# Patient Record
Sex: Male | Born: 1955 | Race: Asian | Hispanic: No | Marital: Married | Smoking: Never smoker
Health system: Southern US, Community
[De-identification: ages and names within clinical notes are randomized; demographics above are authoritative.]

## PROBLEM LIST (undated history)

## (undated) DIAGNOSIS — G609 Hereditary and idiopathic neuropathy, unspecified: Secondary | ICD-10-CM

## (undated) DIAGNOSIS — N2581 Secondary hyperparathyroidism of renal origin: Secondary | ICD-10-CM

## (undated) DIAGNOSIS — N189 Chronic kidney disease, unspecified: Secondary | ICD-10-CM

## (undated) DIAGNOSIS — E875 Hyperkalemia: Secondary | ICD-10-CM

## (undated) DIAGNOSIS — R002 Palpitations: Secondary | ICD-10-CM

## (undated) DIAGNOSIS — R809 Proteinuria, unspecified: Secondary | ICD-10-CM

## (undated) DIAGNOSIS — E785 Hyperlipidemia, unspecified: Secondary | ICD-10-CM

## (undated) DIAGNOSIS — I2510 Atherosclerotic heart disease of native coronary artery without angina pectoris: Secondary | ICD-10-CM

## (undated) DIAGNOSIS — J189 Pneumonia, unspecified organism: Secondary | ICD-10-CM

## (undated) DIAGNOSIS — E119 Type 2 diabetes mellitus without complications: Secondary | ICD-10-CM

## (undated) DIAGNOSIS — D638 Anemia in other chronic diseases classified elsewhere: Secondary | ICD-10-CM

## (undated) DIAGNOSIS — Z940 Kidney transplant status: Secondary | ICD-10-CM

## (undated) DIAGNOSIS — I10 Essential (primary) hypertension: Secondary | ICD-10-CM

## (undated) DIAGNOSIS — I214 Non-ST elevation (NSTEMI) myocardial infarction: Secondary | ICD-10-CM

## (undated) HISTORY — DX: Chronic kidney disease, unspecified: N18.9

## (undated) HISTORY — DX: Proteinuria, unspecified: R80.9

## (undated) HISTORY — DX: Essential (primary) hypertension: I10

## (undated) HISTORY — PX: CATARACT EXTRACTION W/ INTRAOCULAR LENS IMPLANT: SHX1309

## (undated) HISTORY — DX: Secondary hyperparathyroidism of renal origin: N25.81

## (undated) HISTORY — DX: Hyperkalemia: E87.5

## (undated) HISTORY — DX: Hereditary and idiopathic neuropathy, unspecified: G60.9

## (undated) HISTORY — DX: Hyperlipidemia, unspecified: E78.5

## (undated) HISTORY — DX: Palpitations: R00.2

---

## 2000-02-18 ENCOUNTER — Encounter: Admission: RE | Admit: 2000-02-18 | Discharge: 2000-05-18 | Payer: Self-pay | Admitting: Endocrinology

## 2004-02-12 ENCOUNTER — Ambulatory Visit: Payer: Self-pay | Admitting: Endocrinology

## 2004-02-21 ENCOUNTER — Ambulatory Visit: Payer: Self-pay | Admitting: Endocrinology

## 2004-05-06 ENCOUNTER — Ambulatory Visit: Payer: Self-pay | Admitting: Endocrinology

## 2004-05-12 ENCOUNTER — Ambulatory Visit: Payer: Self-pay | Admitting: Endocrinology

## 2004-06-09 ENCOUNTER — Encounter: Admission: RE | Admit: 2004-06-09 | Discharge: 2004-06-09 | Payer: Self-pay | Admitting: Neurology

## 2004-07-17 ENCOUNTER — Ambulatory Visit: Payer: Self-pay | Admitting: Endocrinology

## 2004-12-09 ENCOUNTER — Ambulatory Visit: Payer: Self-pay | Admitting: Endocrinology

## 2004-12-15 ENCOUNTER — Ambulatory Visit: Payer: Self-pay | Admitting: Endocrinology

## 2005-08-19 ENCOUNTER — Ambulatory Visit: Payer: Self-pay | Admitting: Endocrinology

## 2005-08-31 ENCOUNTER — Ambulatory Visit: Payer: Self-pay | Admitting: Endocrinology

## 2005-09-17 ENCOUNTER — Ambulatory Visit: Payer: Self-pay | Admitting: Endocrinology

## 2005-09-23 ENCOUNTER — Ambulatory Visit: Payer: Self-pay | Admitting: Endocrinology

## 2005-12-21 ENCOUNTER — Ambulatory Visit: Payer: Self-pay | Admitting: Endocrinology

## 2005-12-21 LAB — CONVERTED CEMR LAB: Hgb A1c MFr Bld: 14.7 % — ABNORMAL HIGH (ref 4.6–6.0)

## 2005-12-23 ENCOUNTER — Ambulatory Visit: Payer: Self-pay | Admitting: Endocrinology

## 2006-04-12 ENCOUNTER — Ambulatory Visit: Payer: Self-pay | Admitting: Endocrinology

## 2006-04-12 LAB — CONVERTED CEMR LAB: Hgb A1c MFr Bld: 13.5 % — ABNORMAL HIGH (ref 4.6–6.0)

## 2006-04-14 ENCOUNTER — Ambulatory Visit: Payer: Self-pay | Admitting: Endocrinology

## 2006-07-12 ENCOUNTER — Ambulatory Visit: Payer: Self-pay | Admitting: Endocrinology

## 2006-07-12 LAB — CONVERTED CEMR LAB
Creatinine,U: 211.7 mg/dL
Hgb A1c MFr Bld: 13.6 % — ABNORMAL HIGH (ref 4.6–6.0)
Microalb Creat Ratio: 109.1 mg/g — ABNORMAL HIGH (ref 0.0–30.0)
Microalb, Ur: 23.1 mg/dL — ABNORMAL HIGH (ref 0.0–1.9)

## 2006-07-14 ENCOUNTER — Ambulatory Visit: Payer: Self-pay | Admitting: Endocrinology

## 2007-01-10 ENCOUNTER — Encounter: Payer: Self-pay | Admitting: Endocrinology

## 2007-01-10 DIAGNOSIS — G609 Hereditary and idiopathic neuropathy, unspecified: Secondary | ICD-10-CM

## 2007-01-10 DIAGNOSIS — E785 Hyperlipidemia, unspecified: Secondary | ICD-10-CM

## 2007-01-10 DIAGNOSIS — F329 Major depressive disorder, single episode, unspecified: Secondary | ICD-10-CM

## 2007-01-10 DIAGNOSIS — I10 Essential (primary) hypertension: Secondary | ICD-10-CM

## 2007-01-10 DIAGNOSIS — J309 Allergic rhinitis, unspecified: Secondary | ICD-10-CM | POA: Insufficient documentation

## 2007-01-10 HISTORY — DX: Hereditary and idiopathic neuropathy, unspecified: G60.9

## 2007-01-10 HISTORY — DX: Hyperlipidemia, unspecified: E78.5

## 2007-01-10 HISTORY — DX: Essential (primary) hypertension: I10

## 2007-02-10 ENCOUNTER — Ambulatory Visit: Payer: Self-pay | Admitting: Endocrinology

## 2007-02-11 LAB — CONVERTED CEMR LAB
ALT: 33 units/L (ref 0–53)
AST: 32 units/L (ref 0–37)
Albumin: 3.3 g/dL — ABNORMAL LOW (ref 3.5–5.2)
Alkaline Phosphatase: 70 units/L (ref 39–117)
BUN: 26 mg/dL — ABNORMAL HIGH (ref 6–23)
Bacteria, UA: NEGATIVE
Basophils Absolute: 0 10*3/uL (ref 0.0–0.1)
Basophils Relative: 0 % (ref 0.0–1.0)
Bilirubin Urine: NEGATIVE
Bilirubin, Direct: 0.1 mg/dL (ref 0.0–0.3)
CO2: 28 meq/L (ref 19–32)
Calcium: 8.9 mg/dL (ref 8.4–10.5)
Chloride: 104 meq/L (ref 96–112)
Cholesterol: 142 mg/dL (ref 0–200)
Creatinine, Ser: 1.8 mg/dL — ABNORMAL HIGH (ref 0.4–1.5)
Creatinine,U: 204.1 mg/dL
Crystals: NEGATIVE
Eosinophils Absolute: 0.4 10*3/uL (ref 0.0–0.6)
Eosinophils Relative: 5.5 % — ABNORMAL HIGH (ref 0.0–5.0)
GFR calc Af Amer: 51 mL/min
GFR calc non Af Amer: 42 mL/min
Glucose, Bld: 194 mg/dL — ABNORMAL HIGH (ref 70–99)
HCT: 35.7 % — ABNORMAL LOW (ref 39.0–52.0)
HDL: 33.2 mg/dL — ABNORMAL LOW (ref >39.0)
Hemoglobin: 12.4 g/dL — ABNORMAL LOW (ref 13.0–17.0)
Hgb A1c MFr Bld: 12.6 % — ABNORMAL HIGH (ref 4.6–6.0)
Ketones, ur: NEGATIVE mg/dL
LDL Cholesterol: 88 mg/dL (ref 0–99)
Leukocytes, UA: NEGATIVE
Lymphocytes Relative: 33.2 % (ref 12.0–46.0)
MCHC: 34.6 g/dL (ref 30.0–36.0)
MCV: 86.2 fL (ref 78.0–100.0)
Microalb Creat Ratio: 762.9 mg/g — ABNORMAL HIGH (ref 0.0–30.0)
Microalb, Ur: 155.7 mg/dL — ABNORMAL HIGH (ref 0.0–1.9)
Monocytes Absolute: 0.7 10*3/uL (ref 0.2–0.7)
Monocytes Relative: 8.6 % (ref 3.0–11.0)
Neutro Abs: 4.1 10*3/uL (ref 1.4–7.7)
Neutrophils Relative %: 52.7 % (ref 43.0–77.0)
Nitrite: NEGATIVE
PSA: 1.31 ng/mL (ref 0.10–4.00)
Platelets: 182 10*3/uL (ref 150–400)
Potassium: 4.5 meq/L (ref 3.5–5.1)
RBC: 4.14 M/uL — ABNORMAL LOW (ref 4.22–5.81)
RDW: 11.9 % (ref 11.5–14.6)
Sodium: 138 meq/L (ref 135–145)
Specific Gravity, Urine: >=1.03 (ref 1.000–1.03)
Squamous Epithelial / HPF: NEGATIVE /lpf
TSH: 3.32 microintl units/mL (ref 0.35–5.50)
Total Bilirubin: 0.6 mg/dL (ref 0.3–1.2)
Total CHOL/HDL Ratio: 4.3
Total Protein, Urine: >=300 mg/dL — AB
Total Protein: 6.9 g/dL (ref 6.0–8.3)
Triglycerides: 106 mg/dL (ref 0–149)
Urine Glucose: 100 mg/dL — AB
Urobilinogen, UA: 0.2 (ref 0.0–1.0)
VLDL: 21 mg/dL (ref 0–40)
WBC, UA: NONE SEEN cells/hpf
WBC: 7.8 10*3/uL (ref 4.5–10.5)
pH: 5.5 (ref 5.0–8.0)

## 2007-02-15 ENCOUNTER — Ambulatory Visit: Payer: Self-pay | Admitting: Endocrinology

## 2007-02-15 DIAGNOSIS — D649 Anemia, unspecified: Secondary | ICD-10-CM

## 2007-05-23 ENCOUNTER — Ambulatory Visit: Payer: Self-pay | Admitting: Endocrinology

## 2007-05-23 DIAGNOSIS — F411 Generalized anxiety disorder: Secondary | ICD-10-CM | POA: Insufficient documentation

## 2007-05-23 LAB — CONVERTED CEMR LAB: Hgb A1c MFr Bld: 14 % — ABNORMAL HIGH (ref 4.6–6.0)

## 2007-11-17 ENCOUNTER — Ambulatory Visit: Payer: Self-pay | Admitting: Endocrinology

## 2007-11-17 DIAGNOSIS — M79609 Pain in unspecified limb: Secondary | ICD-10-CM | POA: Insufficient documentation

## 2007-11-17 LAB — CONVERTED CEMR LAB
Hgb A1c MFr Bld: 12.1 % — ABNORMAL HIGH (ref 4.6–6.0)
Sed Rate: 32 mm/hr — ABNORMAL HIGH (ref 0–16)

## 2008-03-19 ENCOUNTER — Ambulatory Visit: Payer: Self-pay | Admitting: Endocrinology

## 2008-03-19 DIAGNOSIS — R002 Palpitations: Secondary | ICD-10-CM

## 2008-03-19 HISTORY — DX: Palpitations: R00.2

## 2008-06-06 ENCOUNTER — Telehealth (INDEPENDENT_AMBULATORY_CARE_PROVIDER_SITE_OTHER): Payer: Self-pay | Admitting: *Deleted

## 2008-06-07 ENCOUNTER — Ambulatory Visit: Payer: Self-pay | Admitting: Endocrinology

## 2008-06-07 DIAGNOSIS — R809 Proteinuria, unspecified: Secondary | ICD-10-CM | POA: Insufficient documentation

## 2008-06-07 DIAGNOSIS — N189 Chronic kidney disease, unspecified: Secondary | ICD-10-CM

## 2008-06-07 HISTORY — DX: Proteinuria, unspecified: R80.9

## 2008-06-07 HISTORY — DX: Chronic kidney disease, unspecified: N18.9

## 2008-06-07 LAB — CONVERTED CEMR LAB
BUN: 54 mg/dL — ABNORMAL HIGH (ref 6–23)
Bilirubin Urine: NEGATIVE
CO2: 27 meq/L (ref 19–32)
Calcium: 8.8 mg/dL (ref 8.4–10.5)
Chloride: 110 meq/L (ref 96–112)
Creatinine, Ser: 2.5 mg/dL — ABNORMAL HIGH (ref 0.4–1.5)
GFR calc non Af Amer: 28.71 mL/min (ref >60)
Glucose, Bld: 185 mg/dL — ABNORMAL HIGH (ref 70–99)
Glucose, Urine, Semiquant: 100
Hgb A1c MFr Bld: 10.4 % — ABNORMAL HIGH (ref 4.6–6.5)
Ketones, urine, test strip: NEGATIVE
Nitrite: NEGATIVE
Potassium: 5.2 meq/L — ABNORMAL HIGH (ref 3.5–5.1)
Protein, U semiquant: >=300
Sodium: 140 meq/L (ref 135–145)
Specific Gravity, Urine: 1.025
Urobilinogen, UA: 0.2
WBC Urine, dipstick: NEGATIVE
pH: 5

## 2008-06-11 ENCOUNTER — Encounter: Payer: Self-pay | Admitting: Endocrinology

## 2008-06-13 LAB — CONVERTED CEMR LAB: Protein, Ur: 3640 mg/24hr — ABNORMAL HIGH (ref 50–100)

## 2008-07-04 ENCOUNTER — Encounter: Payer: Self-pay | Admitting: Endocrinology

## 2008-07-10 ENCOUNTER — Ambulatory Visit: Payer: Self-pay | Admitting: Endocrinology

## 2008-07-10 DIAGNOSIS — N2581 Secondary hyperparathyroidism of renal origin: Secondary | ICD-10-CM

## 2008-07-10 HISTORY — DX: Secondary hyperparathyroidism of renal origin: N25.81

## 2008-07-10 LAB — CONVERTED CEMR LAB
Albumin ELP: 52.9 % — ABNORMAL LOW
Alpha-1-Globulin: 4.5 %
Alpha-2-Globulin: 8.3 %
BUN: 73 mg/dL — ABNORMAL HIGH
Basophils Absolute: 0.1 K/uL
Basophils Relative: 0.6 %
Beta Globulin: 5.8 %
CO2: 29 meq/L
Calcium, Total (PTH): 9.1 mg/dL
Calcium: 8.7 mg/dL
Chloride: 101 meq/L
Creatinine, Ser: 3.5 mg/dL — ABNORMAL HIGH
Eosinophils Absolute: 0.2 K/uL
Eosinophils Relative: 2.1 %
Folate: >20 ng/mL
GFR calc non Af Amer: 19.46 mL/min
Gamma Globulin: 20.9 % — ABNORMAL HIGH
Glucose, Bld: 341 mg/dL — ABNORMAL HIGH
HCT: 32.5 % — ABNORMAL LOW
Hemoglobin: 11.1 g/dL — ABNORMAL LOW
Iron: 48 ug/dL
Lymphocytes Relative: 17.7 %
Lymphs Abs: 1.5 K/uL
MCHC: 34.1 g/dL
MCV: 85 fL
Monocytes Absolute: 0.7 K/uL
Monocytes Relative: 7.9 %
Neutro Abs: 6.2 K/uL
Neutrophils Relative %: 71.7 %
PTH: 94.6 pg/mL — ABNORMAL HIGH
Platelets: 196 K/uL
Potassium: 5.5 meq/L — ABNORMAL HIGH
RBC: 3.83 M/uL — ABNORMAL LOW
RDW: 12.5 %
Saturation Ratios: 16.6 % — ABNORMAL LOW
Sodium: 136 meq/L
Total Protein, Serum Electrophoresis: 7.6 g/dL
Transferrin: 206.8 mg/dL — ABNORMAL LOW
Vitamin B-12: 907 pg/mL
WBC: 8.7 10*3/microliter

## 2008-11-01 ENCOUNTER — Encounter: Payer: Self-pay | Admitting: Endocrinology

## 2008-11-12 ENCOUNTER — Encounter (HOSPITAL_COMMUNITY): Admission: RE | Admit: 2008-11-12 | Discharge: 2009-02-10 | Payer: Self-pay | Admitting: Nephrology

## 2009-03-29 ENCOUNTER — Telehealth (INDEPENDENT_AMBULATORY_CARE_PROVIDER_SITE_OTHER): Payer: Self-pay | Admitting: *Deleted

## 2009-04-08 ENCOUNTER — Encounter: Payer: Self-pay | Admitting: Endocrinology

## 2009-04-09 ENCOUNTER — Ambulatory Visit: Payer: Self-pay | Admitting: Endocrinology

## 2009-04-09 DIAGNOSIS — E875 Hyperkalemia: Secondary | ICD-10-CM

## 2009-04-09 HISTORY — DX: Hyperkalemia: E87.5

## 2009-07-09 ENCOUNTER — Encounter: Payer: Self-pay | Admitting: Endocrinology

## 2009-11-05 ENCOUNTER — Encounter: Payer: Self-pay | Admitting: Endocrinology

## 2009-11-25 ENCOUNTER — Encounter (HOSPITAL_COMMUNITY)
Admission: RE | Admit: 2009-11-25 | Discharge: 2010-02-23 | Payer: Self-pay | Source: Home / Self Care | Attending: Nephrology | Admitting: Nephrology

## 2009-12-09 ENCOUNTER — Encounter: Payer: Self-pay | Admitting: Endocrinology

## 2009-12-31 ENCOUNTER — Ambulatory Visit: Payer: Self-pay | Admitting: Vascular Surgery

## 2010-01-08 ENCOUNTER — Ambulatory Visit (HOSPITAL_COMMUNITY)
Admission: RE | Admit: 2010-01-08 | Discharge: 2010-01-08 | Payer: Self-pay | Source: Home / Self Care | Attending: Vascular Surgery | Admitting: Vascular Surgery

## 2010-01-31 ENCOUNTER — Ambulatory Visit: Payer: Self-pay | Admitting: Vascular Surgery

## 2010-02-11 ENCOUNTER — Encounter: Payer: Self-pay | Admitting: Endocrinology

## 2010-02-19 LAB — POCT HEMOGLOBIN-HEMACUE: Hemoglobin: 9.2 g/dL — ABNORMAL LOW (ref 13.0–17.0)

## 2010-02-24 ENCOUNTER — Encounter (HOSPITAL_COMMUNITY)
Admission: RE | Admit: 2010-02-24 | Discharge: 2010-03-04 | Payer: Self-pay | Source: Home / Self Care | Attending: Nephrology | Admitting: Nephrology

## 2010-03-02 LAB — CONVERTED CEMR LAB
Basophils Absolute: 0 10*3/uL (ref 0.0–0.1)
Basophils Relative: 0.3 % (ref 0.0–1.0)
Eosinophils Absolute: 0.3 10*3/uL (ref 0.0–0.6)
Eosinophils Relative: 3.6 % (ref 0.0–5.0)
Ferritin: 53.9 ng/mL (ref 22.0–322.0)
Folate: 16.3 ng/mL
HCT: 38.6 % — ABNORMAL LOW (ref 39.0–52.0)
Hemoglobin: 13.2 g/dL (ref 13.0–17.0)
Hgb A1c MFr Bld: 12.1 % — ABNORMAL HIGH (ref 4.6–6.0)
Iron: 61 ug/dL (ref 42–165)
Lymphocytes Relative: 30.1 % (ref 12.0–46.0)
MCHC: 34.3 g/dL (ref 30.0–36.0)
MCV: 86.5 fL (ref 78.0–100.0)
Monocytes Absolute: 0.6 10*3/uL (ref 0.2–0.7)
Monocytes Relative: 6.8 % (ref 3.0–11.0)
Neutro Abs: 5.1 10*3/uL (ref 1.4–7.7)
Neutrophils Relative %: 59.2 % (ref 43.0–77.0)
Platelets: 196 10*3/uL (ref 150–400)
RBC: 4.46 M/uL (ref 4.22–5.81)
RDW: 12.4 % (ref 11.5–14.6)
Vitamin B-12: 600 pg/mL (ref 211–911)
WBC: 8.6 10*3/uL (ref 4.5–10.5)

## 2010-03-04 LAB — POCT HEMOGLOBIN-HEMACUE: Hemoglobin: 10.8 g/dL — ABNORMAL LOW (ref 13.0–17.0)

## 2010-03-04 NOTE — Assessment & Plan Note (Signed)
Summary: fu--d/t---stc   Vital Signs:  Patient profile:   55 year old male Height:      65 inches Weight:      161 pounds BMI:     26.89 O2 Sat:      99 % on Room air Temp:     98.1 degrees F oral Pulse rate:   82 / minute Pulse rhythm:   regular BP sitting:   148 / 90  (left arm) Cuff size:   regular  Vitals Entered By: Brenton Grills (April 09, 2009 3:52 PM)  O2 Flow:  Room air CC: follow-up visit/aj   CC:  follow-up visit/aj.  History of Present Illness: the status of 3 chronic medical problems is addressed today: he stopped vytorn recently on his own, but he tolerated it well. no cbg record, but states cbg's are intermittently mildly low at night.  he denies syncope. hyperkalemia was noted by dr Kathrene Bongo.  no muscle weakness. dr Kathrene Bongo gave him rx for hydralazine, but pt says he doesn't mind taking a name-brand prescription.  Current Medications (verified): 1)  Adult Aspirin Low Strength 81 Mg  Tbdp (Aspirin) .... Take 1 By Mouth Qd 2)  Vytorin 10-80 Mg  Tabs (Ezetimibe-Simvastatin) .... Take 1 By Mouth Qd 3)  Alprazolam 0.25 Mg  Tabs (Alprazolam) .... Take 1-2 By Mouth Once Daily Prn 4)  Levitra 20 Mg  Tabs (Vardenafil Hcl) .... Use Prn 5)  Bd U/f Original Pen Needle 29g X 12.91mm  Misc (Insulin Pen Needle) .... Use As Directed 6)  Novolog Mix 70/30 Flexpen 70-30 %  Susp (Insulin Aspart Prot & Aspart) .... 50 Units Qam 7)  Tramadol Hcl 50 Mg Tabs (Tramadol Hcl) .Marland Kitchen.. 1-2 Q6h As Needed Pain 8)  Carvedilol 25 Mg Tabs (Carvedilol) .... Bid 9)  Calcitriol 0.25 Mcg Caps (Calcitriol) .Marland Kitchen.. 1 Once Daily 10)  Furosemide 40 Mg Tabs (Furosemide) .Marland Kitchen.. 1 Tab Qd 11)  Lisinopril 10 Mg Tabs (Lisinopril) .Marland Kitchen.. 1 Tab Qd 12)  Ciprofloxacin Hcl 250 Mg Tabs (Ciprofloxacin Hcl) .... Take 1 Two Times A Day If Diarrhea 13)  Ondansetron 4 Mg Tbdp (Ondansetron) .Marland Kitchen.. 1 Q4h As Needed For Nausea  Allergies (verified): 1)  ! 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    Physical Exam  General:  normal appearance.   Extremities:  trace right pedal edema and trace left pedal edema.   Additional Exam:  a1c=10.7 ldl=192 k+=5.8   Impression & Recommendations:  Problem # 1:  HYPERLIPIDEMIA (ICD-272.4) therapy limited by noncompliance.  i'll do the best i can.  Problem # 2:  DIABETES MELLITUS, TYPE I (ICD-250.01) therapy limited by noncompliance with cbg's.  i'll do the best i can.  Problem # 3:  HYPERTENSION (ICD-401.9) needs increased rx.  since he doesn't mind taking a name--brand rx, i'm sure bidil would be ok with dr Kathrene Bongo.  Problem # 4:  HYPERKALEMIA (ICD-276.7) due to renal dz and lisinopril  Medications Added to Medication List This Visit: 1)  Diltiazem Hcl Er Beads 120 Mg Xr24h-cap (Diltiazem hcl er beads) .Marland Kitchen.. 1 once daily 2)  Crestor 40  Mg Tabs (Rosuvastatin calcium) .Marland Kitchen.. 1 at bedtime 3)  Bidil 20-37.5 Mg Tabs (Isosorb dinitrate-hydralazine) .Marland Kitchen.. 1 two times a day 4)  Levemir Flexpen 100 Unit/ml Soln (Insulin detemir) .... 35 units each am, and needles once daily, # 30  Other Orders: EKG w/ Interpretation (93000) Est. Patient Level IV (46962)  Patient Instructions: 1)  stop lisinopril. 2)  take crestor 40 mg once daily 3)  bidil 1 two times a day. 4)  change current insulin to levemir 35 units each am. 5)  check your blood sugar 2 times a day.   vary the time of day when you check, between before the 3 meals, and at bedtime.  also check if you have symptoms of your blood sugar being too high or too low.  please keep a record of the readings and bring it to your next appointment here.  please call us sooner if you are having low blood sugar episodes 6)  Please schedule an appointment  for a physical in 3 months. Prescriptions: LEVEMIR FLEXPEN 100 UNIT/ML SOLN (INSULIN DETEMIR) 35 units each am, and needles once daily, # 30  #1 box x 11   Entered and Authorized by:   Minus Breeding MD   Signed by:   Minus Breeding MD on 04/09/2009   Method used:   Electronically to        [facility] (retail)       [address]       [place], [place]  [id number]       Ph: [phone]       Fax: [phone]   RxID:   [id number] BIDIL 20-37.5 MG TABS (ISOSORB DINITRATE-HYDRALAZINE) 1 two times a day  #60 x 11   Entered and Authorized by:   Minus Breeding MD   Signed by:   Minus Breeding MD on 04/09/2009   Method used:   Electronically to        [facility] (retail)       [address]       [place], [place]  [address]       Ph: [phone]       Fax: [phone]   RxID:   [id number] BIDIL 20-37.5 MG TABS (ISOSORB DINITRATE-HYDRALAZINE) 1 two times a day  #60 x 11   Entered and Authorized by:   Minus Breeding MD   Signed by:   Minus Breeding MD on 04/09/2009   Method used:   Electronically to        [facility] (retail)       [address]       [place], [place]  [address]       Ph: [phone]       Fax: [phone]   RxID:   [id number] CRESTOR 40 MG TABS (ROSUVASTATIN CALCIUM) 1 at bedtime  #30 x 11   Entered and Authorized by:   Minus Breeding MD   Signed by:   Minus Breeding MD on 04/09/2009   Method used:   Electronically to        [facility] (retail)       [address]       [place], [place]  [address]        Ph: [phone]       Fax: [phone]   RxID:   [id number] DILTIAZEM HCL ER BEADS 120 MG XR24H-CAP (DILTIAZEM HCL ER  BEADS) 1 once daily  #30 x 11   Entered and Authorized by:   Minus Breeding MD   Signed by:   Minus Breeding MD on 04/09/2009   Method used:   Electronically to        [facility] (retail)       [address]       [place], [place]  [address]       Ph: [phone]       Fax: [phone]   RxID:   [id number]

## 2010-03-04 NOTE — Letter (Signed)
Summary: [facility]  [facility]   Imported By: Lester Chebanse 12/17/2009 07:55:31  _____________________________________________________________________  External Attachment:    Type:   Image     Comment:   External Document

## 2010-03-04 NOTE — Progress Notes (Signed)
Summary: Follow up appt  Phone Note Outgoing Call   Summary of Call: Left message at pts work to call me back. MD says a f/u is due. Initial call taken by: Josph Macho RMA,  March 29, 2009 1:51 PM  Follow-up for Phone Call        Pt has appt for 3/8 @ 3:45 p.m. Follow-up by: Verdell Face,  March 29, 2009 2:19 PM

## 2010-03-04 NOTE — Letter (Signed)
Summary: [facility]   Imported By: Lester Acton 11/26/2009 08:08:38  _____________________________________________________________________  External Attachment:    Type:   Image     Comment:   External Document

## 2010-03-04 NOTE — Letter (Signed)
Summary: [facility]   Imported By: Sherian Rein 04/17/2009 08:26:34  _____________________________________________________________________  External Attachment:    Type:   Image     Comment:   External Document

## 2010-03-04 NOTE — Letter (Signed)
Summary: [facility]   Imported By: Sherian Rein 07/23/2009 11:17:47  _____________________________________________________________________  External Attachment:    Type:   Image     Comment:   External Document

## 2010-03-06 NOTE — Letter (Signed)
Summary: [facility]  [facility]   Imported By: Lennie Odor 02/21/2010 15:05:57  _____________________________________________________________________  External Attachment:    Type:   Image     Comment:   External Document

## 2010-03-11 ENCOUNTER — Ambulatory Visit (INDEPENDENT_AMBULATORY_CARE_PROVIDER_SITE_OTHER): Payer: 59 | Admitting: Vascular Surgery

## 2010-03-11 DIAGNOSIS — N186 End stage renal disease: Secondary | ICD-10-CM

## 2010-03-14 NOTE — Assessment & Plan Note (Signed)
OFFICE VISIT  Steven Riddle, [name] DOB:  21-Jan-1956                                       03/11/2010 CHART#:[id number]  This patient returns today for initial follow-up regarding his right upper arm AV fistula which I created December 7 for end-stage renal disease.  He is not yet on hemodialysis.  He is left-handed.  He denies any pain or numbness in the right hand side of the fistula.  On exam today his right upper arm fistula has an excellent pulse and palpable thrill.  There is a very a very prominent side branch communicating with the basilic system about 5 cm from the antecubital incision.  Compression of this does augment the fistula slightly.  He has a palpable radial pulse distally.  I think this branch should be ligated to help maturation the fistula and I have schedule that as an outpatient on Thursday, 03/20/2010. Hopefully this will increase the likelihood that this fistula will continue to mature and be satisfactory for hemodialysis.    Quita Skye Hart Rochester, M.D. Electronically Signed  JDL/MEDQ  D:  03/11/2010  T:  03/12/2010  Job:  [id number]

## 2010-03-17 ENCOUNTER — Encounter (HOSPITAL_COMMUNITY): Payer: Self-pay

## 2010-03-20 ENCOUNTER — Ambulatory Visit (HOSPITAL_COMMUNITY)
Admission: RE | Admit: 2010-03-20 | Discharge: 2010-03-20 | Disposition: A | Discharge status: Home / Self Care | Payer: 59 | Source: Ambulatory Visit | Attending: Vascular Surgery | Admitting: Vascular Surgery

## 2010-03-20 DIAGNOSIS — N186 End stage renal disease: Secondary | ICD-10-CM

## 2010-03-20 DIAGNOSIS — Y832 Surgical operation with anastomosis, bypass or graft as the cause of abnormal reaction of the patient, or of later complication, without mention of misadventure at the time of the procedure: Secondary | ICD-10-CM | POA: Insufficient documentation

## 2010-03-20 DIAGNOSIS — E119 Type 2 diabetes mellitus without complications: Secondary | ICD-10-CM | POA: Insufficient documentation

## 2010-03-20 DIAGNOSIS — I120 Hypertensive chronic kidney disease with stage 5 chronic kidney disease or end stage renal disease: Secondary | ICD-10-CM | POA: Insufficient documentation

## 2010-03-20 DIAGNOSIS — T82898A Other specified complication of vascular prosthetic devices, implants and grafts, initial encounter: Secondary | ICD-10-CM

## 2010-03-20 DIAGNOSIS — Z01812 Encounter for preprocedural laboratory examination: Secondary | ICD-10-CM | POA: Insufficient documentation

## 2010-03-20 DIAGNOSIS — T82598A Other mechanical complication of other cardiac and vascular devices and implants, initial encounter: Secondary | ICD-10-CM | POA: Insufficient documentation

## 2010-03-20 LAB — SURGICAL PCR SCREEN
MRSA, PCR: NEGATIVE
Staphylococcus aureus: NEGATIVE

## 2010-03-20 LAB — POCT I-STAT 4, (NA,K, GLUC, HGB,HCT)
Glucose, Bld: 101 mg/dL — ABNORMAL HIGH (ref 70–99)
HCT: 37 % — ABNORMAL LOW (ref 39.0–52.0)
Hemoglobin: 12.6 g/dL — ABNORMAL LOW (ref 13.0–17.0)
Potassium: 4.4 mEq/L (ref 3.5–5.1)
Sodium: 141 mEq/L (ref 135–145)

## 2010-03-20 LAB — GLUCOSE, CAPILLARY: Glucose-Capillary: 79 mg/dL (ref 70–99)

## 2010-03-24 ENCOUNTER — Encounter (HOSPITAL_COMMUNITY): Payer: 59 | Attending: Nephrology

## 2010-03-24 ENCOUNTER — Other Ambulatory Visit: Payer: Self-pay | Admitting: Nephrology

## 2010-03-24 DIAGNOSIS — D638 Anemia in other chronic diseases classified elsewhere: Secondary | ICD-10-CM | POA: Insufficient documentation

## 2010-03-24 DIAGNOSIS — N184 Chronic kidney disease, stage 4 (severe): Secondary | ICD-10-CM | POA: Insufficient documentation

## 2010-03-24 LAB — IRON AND TIBC
Iron: 104 ug/dL (ref 42–135)
Saturation Ratios: 50 % (ref 20–55)
TIBC: 209 ug/dL — ABNORMAL LOW (ref 215–435)
UIBC: 105 ug/dL

## 2010-03-24 LAB — FERRITIN: Ferritin: 705 ng/mL — ABNORMAL HIGH (ref 22–322)

## 2010-03-24 LAB — RENAL FUNCTION PANEL
Albumin: 3 g/dL — ABNORMAL LOW (ref 3.5–5.2)
BUN: 82 mg/dL — ABNORMAL HIGH (ref 6–23)
CO2: 26 mEq/L (ref 19–32)
Calcium: 8.6 mg/dL (ref 8.4–10.5)
Chloride: 104 mEq/L (ref 96–112)
Creatinine, Ser: 4.41 mg/dL — ABNORMAL HIGH (ref 0.4–1.5)
GFR calc Af Amer: 17 mL/min — ABNORMAL LOW (ref >60)
GFR calc non Af Amer: 14 mL/min — ABNORMAL LOW (ref >60)
Glucose, Bld: 279 mg/dL — ABNORMAL HIGH (ref 70–99)
Phosphorus: 5.3 mg/dL — ABNORMAL HIGH (ref 2.3–4.6)
Potassium: 5.1 mEq/L (ref 3.5–5.1)
Sodium: 138 mEq/L (ref 135–145)

## 2010-03-24 NOTE — Op Note (Signed)
°  [name], [name]              ACCOUNT NO.:  [id number]  MEDICAL RECORD NO.:  [id number]           PATIENT TYPE:  O  LOCATION:  [facility]                         FACILITY:  [facility]  PHYSICIAN:  Quita Skye. Hart Rochester, M.D.  DATE OF BIRTH:  1955-09-17  DATE OF PROCEDURE:  03/20/2010 DATE OF DISCHARGE:  03/20/2010                              OPERATIVE REPORT   PREOPERATIVE DIAGNOSIS:  End-stage renal disease with poorly maturing atrioventricular fistula right upper extremity secondary to competing branch.  POSTOPERATIVE DIAGNOSIS:  End-stage renal disease with poorly maturing atrioventricular fistula right upper extremity secondary to competing branch.  OPERATIONS:  Ligation of competing branch of right upper arm AV fistula.  SURGEON:  Quita Skye. Hart Rochester, MD  FIRST ASSISTANT:  Nurse.  ANESTHESIA:  Local.  PROCEDURE:  The patient was taken to the operating room, placed in the supine position, at which time right upper extremity was prepped with Betadine scrub and solution and draped in routine sterile manner.  There was a very prominent competing branch in the right upper arm fistula which had been created by me 2 months earlier.  This branch arose about 6 cm from the antecubital area and extended straight medially and was visible to the eye.  I did look at this with the SonoSite ultrasound duplex scanner to check for other branches which were not found.  After infiltration with 1% Xylocaine with epinephrine, a short transverse incision was made over the branch.  Branch itself was easily identified and ligated with 2-0 silk tie.  There continued to be excellent flow in the fistula.  Wound was closed in layers with Vicryl in a subcuticular fashion with Dermabond.  The patient returned to short-stay in stable condition.     Quita Skye Hart Rochester, M.D.     JDL/MEDQ  D:  03/20/2010  T:  03/20/2010  Job:  [id number]  Electronically Signed by Josephina Gip M.D. on 03/24/2010 12:25:31 PM

## 2010-03-25 LAB — POCT HEMOGLOBIN-HEMACUE: Hemoglobin: 10.5 g/dL — ABNORMAL LOW (ref 13.0–17.0)

## 2010-03-25 LAB — PTH, INTACT AND CALCIUM
Calcium, Total (PTH): 8.6 mg/dL (ref 8.4–10.5)
PTH: 267.1 pg/mL — ABNORMAL HIGH (ref 14.0–72.0)

## 2010-04-15 LAB — SURGICAL PCR SCREEN
MRSA, PCR: NEGATIVE
Staphylococcus aureus: NEGATIVE

## 2010-04-15 LAB — POCT I-STAT 4, (NA,K, GLUC, HGB,HCT)
Glucose, Bld: 68 mg/dL — ABNORMAL LOW (ref 70–99)
HCT: 29 % — ABNORMAL LOW (ref 39.0–52.0)
Hemoglobin: 9.9 g/dL — ABNORMAL LOW (ref 13.0–17.0)
Potassium: 4.1 mEq/L (ref 3.5–5.1)
Sodium: 143 mEq/L (ref 135–145)

## 2010-04-15 LAB — GLUCOSE, CAPILLARY
Glucose-Capillary: 115 mg/dL — ABNORMAL HIGH (ref 70–99)
Glucose-Capillary: 124 mg/dL — ABNORMAL HIGH (ref 70–99)
Glucose-Capillary: 68 mg/dL — ABNORMAL LOW (ref 70–99)

## 2010-05-17 ENCOUNTER — Other Ambulatory Visit: Payer: Self-pay | Admitting: Endocrinology

## 2010-06-05 ENCOUNTER — Other Ambulatory Visit (INDEPENDENT_AMBULATORY_CARE_PROVIDER_SITE_OTHER): Payer: 59

## 2010-06-05 ENCOUNTER — Ambulatory Visit (INDEPENDENT_AMBULATORY_CARE_PROVIDER_SITE_OTHER): Payer: 59 | Admitting: Endocrinology

## 2010-06-05 ENCOUNTER — Encounter: Payer: Self-pay | Admitting: Endocrinology

## 2010-06-05 DIAGNOSIS — K7689 Other specified diseases of liver: Secondary | ICD-10-CM

## 2010-06-05 DIAGNOSIS — N400 Enlarged prostate without lower urinary tract symptoms: Secondary | ICD-10-CM

## 2010-06-05 DIAGNOSIS — K828 Other specified diseases of gallbladder: Secondary | ICD-10-CM

## 2010-06-05 DIAGNOSIS — I428 Other cardiomyopathies: Secondary | ICD-10-CM

## 2010-06-05 DIAGNOSIS — K760 Fatty (change of) liver, not elsewhere classified: Secondary | ICD-10-CM

## 2010-06-05 DIAGNOSIS — K8020 Calculus of gallbladder without cholecystitis without obstruction: Secondary | ICD-10-CM

## 2010-06-05 DIAGNOSIS — I429 Cardiomyopathy, unspecified: Secondary | ICD-10-CM | POA: Insufficient documentation

## 2010-06-05 LAB — HEPATIC FUNCTION PANEL
Albumin: 3.3 g/dL — ABNORMAL LOW (ref 3.5–5.2)
Total Protein: 6.5 g/dL (ref 6.0–8.3)

## 2010-06-05 NOTE — Patient Instructions (Addendum)
On this type of insulin schedule, meals should not be missed or delayed. Refer to dr Sharyn Lull, and to a specialist about the gallbladder.  you will be called with a day and time for an appointment, for each of these two. blood tests are being ordered for you today.  please call [phone] to hear your test results.  You will be prompted to enter the 9-digit "MRN" number that appears at the top left of this page, followed by #.  Then you will hear the message. pending the test results, please continue the same medications for now Please make a follow-up appointment in 3 months. (update: i left message on phone-tree:  rx as we discussed).

## 2010-06-05 NOTE — Progress Notes (Signed)
  Subjective:    Patient ID: Steven Riddle, male    DOB: Nov 04, 1955, 55 y.o.   MRN: [id number]  HPI The state of at least three ongoing medical problems is addressed today: no cbg record, but states cbg's are well-controlled.  He has lost a few lbs, due to his efforts. He is pursuing getting a kidney transplant in [place], from a paid donor.  He had an echo in [place], showing decreased lv function. In his eval in [place] for possible renal transplantation, he was also noted to have abnormal abd [place]. He denies ruq pain. US showed enlarged prostate, fatty liver, and abnormal gallbladder. Denies decreased urinary stream    Review of Systems He seldom has hypoglycemia if a meal is missed or delayed.  He has intermittent nocturnal burping.    Objective:   Physical Exam LUNGS:  Clear to auscultation HEART:Regular rate and rhythm with a soft systolic murmur noted. Normal S1,S2.   Pulses: dorsalis pedis intact bilat.   Feet: no deformity.  no ulcer on the feet.  feet are of normal color and temp.  2+ bilat leg edema.  bilat onychomycosis. Neuro: sensation is intact to touch on the feet.    Lab Results  Component Value Date   HGBA1C 10.4* 06/07/2008   Lab Results  Component Value Date   PSA 2.45 06/05/2010   PSA 1.31 02/10/2007   Lab Results  Component Value Date   ALT 26 06/05/2010   AST 22 06/05/2010   ALKPHOS 81 06/05/2010   BILITOT 0.4 06/05/2010   Assessment & Plan:  Abnormal echo, uncertain etiology Abnormal ultrasound of the abdomen, uncertain etiology. Bph, new problem Dm, therapy limited by noncompliance.  i'll do the best i can. renal failure.  He wishes to pursue transplantation in [place]

## 2010-06-09 ENCOUNTER — Other Ambulatory Visit: Payer: Self-pay | Admitting: Endocrinology

## 2010-06-17 NOTE — Procedures (Signed)
CEPHALIC VEIN MAPPING   INDICATION:  AV fistula placement.   HISTORY:  Stage IV/V kidney disease.   EXAM:  The right cephalic vein is compressible with diameter measurements  ranging from 0.62 to 0.17 cm.   The right basilic vein is compressible with diameter measurements  ranging from 0.35 to 0.30 cm.   The left cephalic vein is compressible with diameter measurements  ranging from 0.50 to 0.14 cm.   The left basilic vein is compressible with diameter measurements ranging  from 0.35 to 0.33 cm.   See attached worksheet for all measurements.   IMPRESSION:  Patent right and left cephalic and basilic veins with  diameter measurements as described above.   ___________________________________________  Steven Skye. Hart Riddle, M.D.   NT/MEDQ  D:  12/31/2009  T:  12/31/2009  Job:  [id number]

## 2010-06-17 NOTE — Assessment & Plan Note (Signed)
OFFICE VISIT   RALF, KONOPKA  DOB:  10/14/1955                                       01/31/2010  CHART#:[id number]   CHIEF COMPLAINT:  Follow-up right brachial cephalic fistula.   HISTORY OF PRESENT ILLNESS:  Patient is a 55 year old gentleman who had  a right upper arm brachiocephalic arteriovenous fistula created by Dr.  Hart Rochester on 01/08/2010.  He has been doing well.  He has no signs of steal  in the right hand and no issues with drainage or pain at the wounds.   PHYSICAL EXAMINATION:  This is a well-developed, well-nourished  gentleman in no acute distress.  His heart rate is 70.  His sats are  98%.  His respiratory rate is 10.  On the right upper extremity, he has  a positive thrill and bruit in the cephalic vein.  He also has a  prominent medial branch approximately 3-4 cm above the antecubital fossa  which connects with the basilic system.  A thrill was felt throughout  the cephalic vein to the mid upper arm.  He has a positive radial and  ulnar pulse which are palpable.  His hand is warm and pink with good  sensation and motion.   ASSESSMENT:  A functioning brachiocephalic arteriovenous fistula in the  right upper extremity with prominent side branches, 1 of which is  connected to the basilic system through which there is moderate flow.   PLAN:  Have the patient come back in 4 weeks to see Dr. Hart Rochester to see if  ligation of the side branches would be necessary.   Della Goo, PA-C   Fransisco Hertz, MD  Electronically Signed   RR/MEDQ  D:  01/31/2010  T:  01/31/2010  Job:  [id number]

## 2010-06-17 NOTE — Consult Note (Signed)
NEW PATIENT CONSULTATION   Steven Riddle, Steven Riddle  DOB:  [birth year]                                       12/31/2009  CHART#:[id number]   The patient is a 55 year old male patient with chronic renal  insufficiency.  He is from [place].  He has been followed by Dr.  Kathrene Bongo for chronic renal insufficiency for at least the last 12  months and now presents for vascular access evaluation.  He is left-  handed.  He has never had dialysis in the past.   CHRONIC MEDICAL PROBLEMS:  1. Diabetes mellitus.  2. Hypertension.  3. Chronic renal insufficiency.  4. Negative for coronary artery disease, COPD or stroke.   SOCIAL HISTORY:  He is married, does not use tobacco or alcohol.   FAMILY HISTORY:  Strongly positive for diabetes in mother and brother,  coronary artery disease in brother and stroke in his mother.   REVIEW OF SYSTEMS:  Positive for occasional discomfort in his feet while  lying.  Denies claudication, chest pain, dyspnea on exertion, productive  cough, bronchitis.  No GI or GU symptoms.  All other systems are  negative in complete review of systems.   PHYSICAL EXAM:  Vital signs:  Blood pressure 180/90, heart rate 82,  respirations 14.  General:  He is a well-developed, well-nourished male  in no apparent distress, alert and oriented x3.  HEENT:  Normal for age.  EOMs intact.  Lungs:  Clear to auscultation.  No rhonchi or wheezing.  Cardiovascular:  Regular rhythm.  No murmurs.  Carotid pulses 3+.  No  audible bruits.  Abdomen:  Soft, nontender with no palpable masses.  Musculoskeletal:  Free of major deformities.  Neurologic:  Normal.  Skin:  Free of rashes.  Lower extremity exam reveals 3+ femoral and  posterior tibial pulses bilaterally.  Upper extremity exam reveals 3+  brachial and radial pulses bilaterally.  Cephalic veins in the upper  arms appear to be adequate bilaterally.   Today I ordered vein mapping in both upper extremities which I have  reviewed and interpreted.  His upper arm cephalic veins are normal in  appearance and compressible with adequate size.  His forearm cephalic  veins are quite borderline distally.   I think he is a good candidate for right upper arm fistula and I will  schedule that for Wednesday 12/07 at [facility] as an outpatient.  I  did discuss potential risks such as steal syndrome which he is aware of  and would like to proceed next week as scheduled.     Quita Skye Hart Rochester, M.D.  Electronically Signed   JDL/MEDQ  D:  12/31/2009  T:  12/31/2009  Job:  [id number]   cc:   Cecille Aver, M.D.

## 2010-06-20 ENCOUNTER — Other Ambulatory Visit (HOSPITAL_COMMUNITY): Payer: Self-pay | Admitting: Cardiology

## 2010-06-20 ENCOUNTER — Encounter: Payer: Self-pay | Admitting: Endocrinology

## 2010-06-27 ENCOUNTER — Telehealth: Payer: Self-pay | Admitting: *Deleted

## 2010-06-27 NOTE — Telephone Encounter (Signed)
Called pt to inform of lab results from last visit. No answer at home phone, unable to leave message. Pt will not be in at work until 2pm today.

## 2010-06-27 NOTE — Telephone Encounter (Signed)
Message copied by Carin Primrose on Fri Jun 27, 2010 10:31 AM ------      Message from: Cristy Hilts F      Created: Thu Jun 26, 2010  1:32 PM                   ----- Message -----         From: Sheffield Slider, RN         Sent: 06/16/2010  11:39 AM           To: Jacelyn Pi, RN                        ----- Message -----         From: Minus Breeding, MD         Sent: 06/05/2010   6:30 PM           To: Doristine Devoid, CMA            please leave message on phone tree--normal

## 2010-06-27 NOTE — Telephone Encounter (Signed)
Pt advised of lab results and copy mailed to per pt req, address verified

## 2010-06-27 NOTE — Telephone Encounter (Signed)
Message copied by Anselm Jungling on Fri Jun 27, 2010  4:03 PM ------      Message from: Cristy Hilts F      Created: Thu Jun 26, 2010  1:32 PM                   ----- Message -----         From: Sheffield Slider, RN         Sent: 06/16/2010  11:39 AM           To: Jacelyn Pi, RN                        ----- Message -----         From: Minus Breeding, MD         Sent: 06/05/2010   6:30 PM           To: Doristine Devoid, CMA            please leave message on phone tree--normal

## 2010-07-02 ENCOUNTER — Ambulatory Visit: Payer: 59 | Admitting: Gastroenterology

## 2010-07-10 ENCOUNTER — Other Ambulatory Visit (HOSPITAL_COMMUNITY): Payer: 59

## 2010-07-14 ENCOUNTER — Ambulatory Visit (HOSPITAL_COMMUNITY): Payer: 59

## 2010-07-14 ENCOUNTER — Encounter (HOSPITAL_COMMUNITY)
Admission: RE | Admit: 2010-07-14 | Discharge: 2010-07-14 | Disposition: A | Discharge status: Home / Self Care | Payer: 59 | Source: Ambulatory Visit | Attending: Cardiology | Admitting: Cardiology

## 2010-07-14 ENCOUNTER — Other Ambulatory Visit (HOSPITAL_COMMUNITY): Payer: 59

## 2010-07-14 DIAGNOSIS — R079 Chest pain, unspecified: Secondary | ICD-10-CM | POA: Insufficient documentation

## 2010-07-14 MED ORDER — TECHNETIUM TC 99M TETROFOSMIN IV KIT
10.0000 | PACK | Freq: Once | INTRAVENOUS | Status: AC | PRN
  Administered 2010-07-14: 10 via INTRAVENOUS

## 2010-07-14 MED ORDER — TECHNETIUM TC 99M TETROFOSMIN IV KIT
30.0000 | PACK | Freq: Once | INTRAVENOUS | Status: AC | PRN
  Administered 2010-07-14: 30 via INTRAVENOUS

## 2010-07-26 ENCOUNTER — Other Ambulatory Visit: Payer: Self-pay | Admitting: Endocrinology

## 2010-07-28 ENCOUNTER — Other Ambulatory Visit (HOSPITAL_COMMUNITY): Payer: 59

## 2010-08-07 ENCOUNTER — Encounter (HOSPITAL_COMMUNITY)
Admission: RE | Admit: 2010-08-07 | Discharge: 2010-08-07 | Disposition: A | Discharge status: Home / Self Care | Payer: 59 | Source: Ambulatory Visit | Attending: Ophthalmology | Admitting: Ophthalmology

## 2010-08-07 LAB — CBC
Hemoglobin: 9 g/dL — ABNORMAL LOW (ref 13.0–17.0)
MCH: 29.8 pg (ref 26.0–34.0)
MCHC: 34 g/dL (ref 30.0–36.0)
RDW: 14.2 % (ref 11.5–15.5)

## 2010-08-07 LAB — BASIC METABOLIC PANEL
Calcium: 8.6 mg/dL (ref 8.4–10.5)
GFR calc Af Amer: 16 mL/min — ABNORMAL LOW (ref >60)
GFR calc non Af Amer: 13 mL/min — ABNORMAL LOW (ref >60)
Glucose, Bld: 180 mg/dL — ABNORMAL HIGH (ref 70–99)
Potassium: 5.5 mEq/L — ABNORMAL HIGH (ref 3.5–5.1)
Sodium: 139 mEq/L (ref 135–145)

## 2010-08-13 ENCOUNTER — Ambulatory Visit: Payer: 59 | Admitting: Gastroenterology

## 2010-08-14 ENCOUNTER — Ambulatory Visit (HOSPITAL_COMMUNITY)
Admission: RE | Admit: 2010-08-14 | Discharge: 2010-08-15 | Disposition: A | Discharge status: Home / Self Care | Payer: 59 | Source: Ambulatory Visit | Attending: Ophthalmology | Admitting: Ophthalmology

## 2010-08-14 DIAGNOSIS — H4310 Vitreous hemorrhage, unspecified eye: Secondary | ICD-10-CM | POA: Insufficient documentation

## 2010-08-14 DIAGNOSIS — E11359 Type 2 diabetes mellitus with proliferative diabetic retinopathy without macular edema: Secondary | ICD-10-CM | POA: Insufficient documentation

## 2010-08-14 DIAGNOSIS — Z01812 Encounter for preprocedural laboratory examination: Secondary | ICD-10-CM | POA: Insufficient documentation

## 2010-08-14 DIAGNOSIS — E11311 Type 2 diabetes mellitus with unspecified diabetic retinopathy with macular edema: Secondary | ICD-10-CM | POA: Insufficient documentation

## 2010-08-14 DIAGNOSIS — E1139 Type 2 diabetes mellitus with other diabetic ophthalmic complication: Secondary | ICD-10-CM | POA: Insufficient documentation

## 2010-08-14 LAB — GLUCOSE, CAPILLARY
Glucose-Capillary: 90 mg/dL (ref 70–99)
Glucose-Capillary: 88 mg/dL (ref 70–99)
Glucose-Capillary: 190 mg/dL — ABNORMAL HIGH (ref 70–99)

## 2010-08-15 LAB — GLUCOSE, CAPILLARY
Glucose-Capillary: 117 mg/dL — ABNORMAL HIGH (ref 70–99)
Glucose-Capillary: 136 mg/dL — ABNORMAL HIGH (ref 70–99)
Glucose-Capillary: 188 mg/dL — ABNORMAL HIGH (ref 70–99)

## 2010-08-21 ENCOUNTER — Other Ambulatory Visit: Payer: Self-pay | Admitting: Nephrology

## 2010-08-21 ENCOUNTER — Encounter (HOSPITAL_COMMUNITY): Payer: 59

## 2010-08-21 ENCOUNTER — Encounter (HOSPITAL_COMMUNITY)
Admission: RE | Admit: 2010-08-21 | Discharge: 2010-08-21 | Disposition: A | Discharge status: Home / Self Care | Payer: 59 | Source: Ambulatory Visit | Attending: Nephrology | Admitting: Nephrology

## 2010-08-21 DIAGNOSIS — N184 Chronic kidney disease, stage 4 (severe): Secondary | ICD-10-CM | POA: Insufficient documentation

## 2010-08-21 DIAGNOSIS — D638 Anemia in other chronic diseases classified elsewhere: Secondary | ICD-10-CM | POA: Insufficient documentation

## 2010-08-22 NOTE — Op Note (Signed)
°  NAMEJERIEL, Steven Riddle              ACCOUNT NO.:  [id number]  MEDICAL RECORD NO.:  [id number]  LOCATION:  5122                         FACILITY:  [facility]  PHYSICIAN:  Beulah Gandy. Ashley Royalty, M.D. DATE OF BIRTH:  [birth year]  DATE OF PROCEDURE:  08/14/2010 DATE OF DISCHARGE:                              OPERATIVE REPORT   ADMISSION DIAGNOSES:  Vitreous hemorrhage, proliferative diabetic retinopathy, macular edema, left eye.  PROCEDURES:  Pars plana vitrectomy, membrane peel, retinal photocoagulation and Kenalog 0.1 mL intravitreal in the left eye.  SURGEON:  Beulah Gandy. Ashley Royalty, MD  ASSISTANT:  Rosalie Doctor, MA  ANESTHESIA:  General.  OPERATIVE DETAILS:  After usual prep and drape, 25-gauge trocars placed at 10, 2, and 4 o'clock, infusion at 4 o'clock.  Provisc placed on the corneal surface.  The pars plana vitrectomy was begun just behind the crystalline lens.  Dense red vitreous hemorrhage was encountered and carefully removed under low suction and rapid cutting.  A core vitrectomy was completed down to the macular surface where hard exudates, thickened tissue, and glistening surface was seen.  The vitrectomy was carried into the mid periphery where additional red dense vitreous hemorrhage was seen and removed.  The vitrectomy was carried into the far periphery with scleral depression, so that all blood down to the vitreous base was removed from the cutter under low suction and rapid cutting.  Attention was carried to the macular area where a diamond dusted membrane scraper was used to engage the ILM and peel it from around the macular region.  Caution was taken because the tissues were quite thin, cystic and hard exudates were present.  The endolaser was positioned in the eye and 297 burns placed around the retinal periphery.  The power was 1500 milliwatts, 1000 microns each, 297 burns and 0.1 seconds each.  A partial gas fluid exchange was performed. Kenalog 0.1 mL, 10 mg/mL was  injected into the vitreous cavity.  The instruments were removed from the eye.  The wounds were tested and found to be tight.  Polymyxin and gentamicin were irrigated into [place] space. Atropine solution was applied.  Marcaine was injected around the globe for postop pain.  Decadron 10 mg was injected into the lower subconjunctival space.  Closing pressure was 10 with a Barraquer tonometer.  Complications none.  Duration 45 minutes.  The patient was awakened and taken to recovery in satisfactory condition.     Beulah Gandy. Ashley Royalty, M.D.     JDM/MEDQ  D:  08/14/2010  T:  08/14/2010  Job:  [id number]  Electronically Signed by Alan Mulder M.D. on 08/22/2010 09:55:14 AM

## 2010-09-24 ENCOUNTER — Encounter (INDEPENDENT_AMBULATORY_CARE_PROVIDER_SITE_OTHER): Payer: 59 | Admitting: Ophthalmology

## 2010-09-24 DIAGNOSIS — H33009 Unspecified retinal detachment with retinal break, unspecified eye: Secondary | ICD-10-CM

## 2010-09-24 DIAGNOSIS — E11359 Type 2 diabetes mellitus with proliferative diabetic retinopathy without macular edema: Secondary | ICD-10-CM

## 2010-09-25 ENCOUNTER — Ambulatory Visit (HOSPITAL_COMMUNITY): Payer: 59

## 2010-09-25 ENCOUNTER — Ambulatory Visit (HOSPITAL_COMMUNITY)
Admission: RE | Admit: 2010-09-25 | Discharge: 2010-09-26 | Disposition: A | Discharge status: Home / Self Care | Payer: 59 | Source: Ambulatory Visit | Attending: Ophthalmology | Admitting: Ophthalmology

## 2010-09-25 DIAGNOSIS — Z01812 Encounter for preprocedural laboratory examination: Secondary | ICD-10-CM | POA: Insufficient documentation

## 2010-09-25 DIAGNOSIS — H33009 Unspecified retinal detachment with retinal break, unspecified eye: Secondary | ICD-10-CM

## 2010-09-25 LAB — GLUCOSE, CAPILLARY
Glucose-Capillary: 101 mg/dL — ABNORMAL HIGH (ref 70–99)
Glucose-Capillary: 77 mg/dL (ref 70–99)
Glucose-Capillary: 68 mg/dL — ABNORMAL LOW (ref 70–99)
Glucose-Capillary: 86 mg/dL (ref 70–99)
Glucose-Capillary: 153 mg/dL — ABNORMAL HIGH (ref 70–99)
Glucose-Capillary: 186 mg/dL — ABNORMAL HIGH (ref 70–99)

## 2010-09-25 LAB — CBC
HCT: 29.7 % — ABNORMAL LOW (ref 39.0–52.0)
Hemoglobin: 9.9 g/dL — ABNORMAL LOW (ref 13.0–17.0)
RBC: 3.39 MIL/uL — ABNORMAL LOW (ref 4.22–5.81)
WBC: 6.6 10*3/uL (ref 4.0–10.5)

## 2010-09-25 LAB — SURGICAL PCR SCREEN: MRSA, PCR: NEGATIVE

## 2010-09-25 LAB — PHOSPHORUS: Phosphorus: 5.9 mg/dL — ABNORMAL HIGH (ref 2.3–4.6)

## 2010-09-25 LAB — BASIC METABOLIC PANEL
CO2: 27 mEq/L (ref 19–32)
Glucose, Bld: 85 mg/dL (ref 70–99)
Potassium: 5.1 mEq/L (ref 3.5–5.1)
Sodium: 141 mEq/L (ref 135–145)

## 2010-09-25 LAB — FERRITIN: Ferritin: 613 ng/mL — ABNORMAL HIGH (ref 22–322)

## 2010-09-25 LAB — IRON AND TIBC: Iron: 53 ug/dL (ref 42–135)

## 2010-09-26 LAB — GLUCOSE, CAPILLARY: Glucose-Capillary: 185 mg/dL — ABNORMAL HIGH (ref 70–99)

## 2010-10-02 ENCOUNTER — Inpatient Hospital Stay (INDEPENDENT_AMBULATORY_CARE_PROVIDER_SITE_OTHER): Payer: 59 | Admitting: Ophthalmology

## 2010-10-02 DIAGNOSIS — H33009 Unspecified retinal detachment with retinal break, unspecified eye: Secondary | ICD-10-CM

## 2010-10-21 NOTE — Op Note (Signed)
°  NAMEMERLIN, GOLDEN              ACCOUNT NO.:  [id number]  MEDICAL RECORD NO.:  [id number]  LOCATION:  5001                         FACILITY:  [facility]  PHYSICIAN:  Beulah Gandy. Ashley Royalty, M.D. DATE OF BIRTH:  08/08/1955  DATE OF PROCEDURE:  09/25/2010 DATE OF DISCHARGE:                              OPERATIVE REPORT   ADMISSION DIAGNOSIS:  Rhegmatogenous retinal detachment in the left eye.  PROCEDURES:  Scleral buckle, left eye, retinal photocoagulation, left eye.  Gas fluid exchange, left eye.  SURGEON:  Beulah Gandy. Ashley Royalty, MD  ASSISTANT:  Rosalie Doctor, SA  ANESTHESIA:  General.  DETAILS:  Usual prep and drape, 360 degrees limbal peritomy, isolation of four rectus muscles on 2-0 silk, scleral dissection for 360 degrees to admit a #279 intrascleral implant, diathermy placed in the bed.  A radial segment was fashioned beneath the break at 12 o'clock. Perforation site chosen at 2:30 o'clock in the anterior aspect of the bed.  A moderate amount of clear colorless subretinal fluid came forth. The eye became soft and 1 mL of C3F8 50% was injected in the vitreous cavity.  The #279 implant was placed against the globe and the 240 band with a 270 sleeve was placed with the joint at 10 o'clock.  Two sutures per quadrant for total of 8 scleral sutures were placed in these scleral flaps.  Scleral flaps were drawn securely.  Indirect ophthalmoscopy showed the retina to be lying nicely on the scleral buckle with the holes well supported on the radial element.  The indirect ophthalmoscope laser was moved into place, 1194 burns were placed around the hole and on the scleral buckle.  The power was 680 milliwatts, 1000 microns each and 0.1 seconds each.  The buckle was adjusted and trimmed.  The band was adjusted and trimmed.  The sutures were knotted and the free ends were removed.  The conjunctiva was reposited with 7-0 chromic suture. Polymyxin and gentamicin were irrigated into [place] space.   Atropine solution was applied.  Marcaine was injected around the globe for postop pain.  Decadron 10 mg was injected to the lower subconjunctival space. Closing pressure was 15 with a Barraquer tonometer.  Polysporin ophthalmic ointment, patch and shield were placed.  The patient was awakened and taken to recovery in satisfactory condition.  Complications none.  Duration 2 hours.     Beulah Gandy. Ashley Royalty, M.D.     JDM/MEDQ  D:  09/25/2010  T:  09/25/2010  Job:  [id number]  Electronically Signed by Alan Mulder M.D. on 10/21/2010 01:46:46 PM

## 2010-10-22 ENCOUNTER — Encounter (INDEPENDENT_AMBULATORY_CARE_PROVIDER_SITE_OTHER): Payer: 59 | Admitting: Ophthalmology

## 2010-10-22 DIAGNOSIS — H33009 Unspecified retinal detachment with retinal break, unspecified eye: Secondary | ICD-10-CM

## 2010-10-23 ENCOUNTER — Other Ambulatory Visit: Payer: Self-pay | Admitting: Nephrology

## 2010-10-23 ENCOUNTER — Encounter (HOSPITAL_COMMUNITY): Payer: 59 | Attending: Nephrology

## 2010-10-23 DIAGNOSIS — N184 Chronic kidney disease, stage 4 (severe): Secondary | ICD-10-CM | POA: Insufficient documentation

## 2010-10-23 DIAGNOSIS — D638 Anemia in other chronic diseases classified elsewhere: Secondary | ICD-10-CM | POA: Insufficient documentation

## 2010-10-23 LAB — RENAL FUNCTION PANEL
BUN: 81 mg/dL — ABNORMAL HIGH (ref 6–23)
CO2: 27 mEq/L (ref 19–32)
GFR calc Af Amer: 18 mL/min — ABNORMAL LOW (ref >60)
Glucose, Bld: 220 mg/dL — ABNORMAL HIGH (ref 70–99)
Potassium: 5.2 mEq/L — ABNORMAL HIGH (ref 3.5–5.1)
Sodium: 141 mEq/L (ref 135–145)

## 2010-10-23 LAB — IRON AND TIBC
Saturation Ratios: 18 % — ABNORMAL LOW (ref 20–55)
TIBC: 238 ug/dL (ref 215–435)
UIBC: 194 ug/dL (ref 125–400)

## 2010-10-23 LAB — POCT HEMOGLOBIN-HEMACUE: Hemoglobin: 9.3 g/dL — ABNORMAL LOW (ref 13.0–17.0)

## 2010-11-04 ENCOUNTER — Encounter (HOSPITAL_COMMUNITY)
Admission: RE | Admit: 2010-11-04 | Discharge: 2010-11-04 | Disposition: A | Discharge status: Home / Self Care | Payer: 59 | Source: Ambulatory Visit | Attending: Nephrology | Admitting: Nephrology

## 2010-11-04 DIAGNOSIS — D638 Anemia in other chronic diseases classified elsewhere: Secondary | ICD-10-CM | POA: Insufficient documentation

## 2010-11-04 DIAGNOSIS — N184 Chronic kidney disease, stage 4 (severe): Secondary | ICD-10-CM | POA: Insufficient documentation

## 2010-11-11 ENCOUNTER — Encounter (HOSPITAL_COMMUNITY): Payer: 59

## 2010-11-20 ENCOUNTER — Other Ambulatory Visit: Payer: Self-pay | Admitting: Nephrology

## 2010-11-20 ENCOUNTER — Encounter (HOSPITAL_COMMUNITY): Payer: 59

## 2010-11-20 LAB — RENAL FUNCTION PANEL
BUN: 92 mg/dL — ABNORMAL HIGH (ref 6–23)
CO2: 28 mEq/L (ref 19–32)
Chloride: 102 mEq/L (ref 96–112)
Glucose, Bld: 192 mg/dL — ABNORMAL HIGH (ref 70–99)
Potassium: 4.9 mEq/L (ref 3.5–5.1)

## 2010-11-20 LAB — FERRITIN: Ferritin: 1348 ng/mL — ABNORMAL HIGH (ref 22–322)

## 2010-11-20 LAB — IRON AND TIBC
Iron: 83 ug/dL (ref 42–135)
Saturation Ratios: 34 % (ref 20–55)
TIBC: 245 ug/dL (ref 215–435)
UIBC: 162 ug/dL (ref 125–400)

## 2010-11-26 ENCOUNTER — Encounter (INDEPENDENT_AMBULATORY_CARE_PROVIDER_SITE_OTHER): Payer: 59 | Admitting: Ophthalmology

## 2010-11-26 DIAGNOSIS — H33009 Unspecified retinal detachment with retinal break, unspecified eye: Secondary | ICD-10-CM

## 2010-12-04 ENCOUNTER — Ambulatory Visit (HOSPITAL_COMMUNITY)
Admission: RE | Admit: 2010-12-04 | Discharge: 2010-12-04 | Disposition: A | Discharge status: Home / Self Care | Payer: 59 | Source: Ambulatory Visit | Attending: Ophthalmology | Admitting: Ophthalmology

## 2010-12-04 DIAGNOSIS — E1139 Type 2 diabetes mellitus with other diabetic ophthalmic complication: Secondary | ICD-10-CM

## 2010-12-04 DIAGNOSIS — I10 Essential (primary) hypertension: Secondary | ICD-10-CM | POA: Insufficient documentation

## 2010-12-04 DIAGNOSIS — E11359 Type 2 diabetes mellitus with proliferative diabetic retinopathy without macular edema: Secondary | ICD-10-CM

## 2010-12-04 LAB — CBC
HCT: 29.2 % — ABNORMAL LOW (ref 39.0–52.0)
MCH: 28.8 pg (ref 26.0–34.0)
MCV: 88.5 fL (ref 78.0–100.0)
RDW: 15.6 % — ABNORMAL HIGH (ref 11.5–15.5)
WBC: 8.4 10*3/uL (ref 4.0–10.5)

## 2010-12-04 LAB — BASIC METABOLIC PANEL
BUN: 100 mg/dL — ABNORMAL HIGH (ref 6–23)
Chloride: 104 mEq/L (ref 96–112)
Creatinine, Ser: 5.04 mg/dL — ABNORMAL HIGH (ref 0.50–1.35)
GFR calc Af Amer: 13 mL/min — ABNORMAL LOW (ref >90)

## 2010-12-05 ENCOUNTER — Inpatient Hospital Stay (INDEPENDENT_AMBULATORY_CARE_PROVIDER_SITE_OTHER): Payer: 59 | Admitting: Ophthalmology

## 2010-12-05 NOTE — Op Note (Signed)
°  NAMESAAD, BUHL              ACCOUNT NO.:  [id number]  MEDICAL RECORD NO.:  [id number]  LOCATION:  [facility]                         FACILITY:  [facility]  PHYSICIAN:  Bryar Rennie D. Ashley Royalty, M.D. DATE OF BIRTH:  [birth year]  DATE OF PROCEDURE:  12/04/2010 DATE OF DISCHARGE:                              OPERATIVE REPORT   ADMISSION DIAGNOSIS:  Proliferative diabetic retinopathy, progressive traction macular detachment, left eye.  PROCEDURES:  Pars plana vitrectomy, removal of posterior membranes, Perfluoron injection, Perfluoron removal, gas-fluid exchange, silicone oil injection, all in the left eye.  Also retinal photocoagulation, left eye.  SURGEON:  Beulah Gandy. Ashley Royalty, MD  ASSISTANT:  Charyl Dancer, RN  ANESTHESIA:  General.  DETAILS:  Usual prep and drape, a 25-gauge trocars were placed at 4 and 10 o'clock, and 3 layered sclerotomy point was marked at 2 o'clock with a diamond knife crescent blade and MVR 20-gauge insertion.  Provisc was placed on the corneal surface.  The pars plana vitrectomy was begun just behind the crystalline lens.  The vitrectomy was carried posteriorly down to the surface of the retina where white membranes were seen pulling on the macular region.  These were stripped with the vitreous cutter, and peeled from their attachments to the macular surface.  The retina was still gray in color around the hole which was on the posterior edge of the scleral buckle.  Perfluoron was injected to reattach the retina.  The endolaser was positioned in the eye, 329 burns were placed around the retinal hole in the posterior pole.  The power was 2000 mW, 1000 microns each and 0.1 seconds each.  A total gas-fluid exchange was performed to remove all Perfluoron.  A BSS rinse was performed twice to assure that all Perfluoron was removed.  Silicone oil was then injected into the entire vitreous cavity holding the retina in place.  Additional laser was performed.  There were a total of  329 burns.  The instruments were removed from the eye.  A 9-0 nylon was used to close the sclerotomy site at 2 o'clock.  Wet-field cautery was used to close the conjunctiva.  The 25-gauge trocars were removed.  Wounds were tested and found to be tight.  Polymyxin and gentamicin were irrigated into tenon space, atropine solution was applied.  Marcaine was injected around the globe for postop pain.  Decadron 10 mg was injected into the lower subconjunctival space.  Polysporin patch and shield were placed.  The patient was awakened, taken to recovery in satisfactory condition.  COMPLICATIONS:  None.  CLOSING PRESSURE:  10.  OPERATIVE TIME:  One hour.     Beulah Gandy. Ashley Royalty, M.D.     JDM/MEDQ  D:  12/04/2010  T:  12/05/2010  Job:  [id number]  Electronically Signed by Alan Mulder M.D. on 12/05/2010 07:32:24 AM

## 2010-12-10 ENCOUNTER — Inpatient Hospital Stay (INDEPENDENT_AMBULATORY_CARE_PROVIDER_SITE_OTHER): Payer: 59 | Admitting: Ophthalmology

## 2010-12-15 ENCOUNTER — Inpatient Hospital Stay (INDEPENDENT_AMBULATORY_CARE_PROVIDER_SITE_OTHER): Payer: 59 | Admitting: Ophthalmology

## 2010-12-15 DIAGNOSIS — H33009 Unspecified retinal detachment with retinal break, unspecified eye: Secondary | ICD-10-CM

## 2010-12-22 ENCOUNTER — Other Ambulatory Visit (HOSPITAL_COMMUNITY): Payer: Self-pay | Admitting: *Deleted

## 2010-12-23 ENCOUNTER — Encounter (HOSPITAL_COMMUNITY)
Admission: RE | Admit: 2010-12-23 | Discharge: 2010-12-23 | Disposition: A | Discharge status: Home / Self Care | Payer: 59 | Source: Ambulatory Visit | Attending: Nephrology | Admitting: Nephrology

## 2010-12-23 DIAGNOSIS — D638 Anemia in other chronic diseases classified elsewhere: Secondary | ICD-10-CM | POA: Insufficient documentation

## 2010-12-23 DIAGNOSIS — N184 Chronic kidney disease, stage 4 (severe): Secondary | ICD-10-CM | POA: Insufficient documentation

## 2010-12-23 LAB — RENAL FUNCTION PANEL
CO2: 24 mEq/L (ref 19–32)
Calcium: 8.6 mg/dL (ref 8.4–10.5)
GFR calc Af Amer: 15 mL/min — ABNORMAL LOW (ref >90)
Glucose, Bld: 204 mg/dL — ABNORMAL HIGH (ref 70–99)
Potassium: 4.7 mEq/L (ref 3.5–5.1)
Sodium: 139 mEq/L (ref 135–145)

## 2010-12-23 LAB — IRON AND TIBC
Iron: 46 ug/dL (ref 42–135)
Saturation Ratios: 20 % (ref 20–55)
UIBC: 187 ug/dL (ref 125–400)

## 2010-12-23 MED ORDER — EPOETIN ALFA 10000 UNIT/ML IJ SOLN
20000.0000 [IU] | INTRAMUSCULAR | Status: DC
Start: 2010-12-23 — End: 2010-12-24

## 2010-12-23 MED ORDER — EPOETIN ALFA 20000 UNIT/ML IJ SOLN
INTRAMUSCULAR | Status: AC
  Administered 2010-12-23: 13:00:00 via SUBCUTANEOUS
  Filled 2010-12-23: qty 1

## 2011-01-12 ENCOUNTER — Encounter (INDEPENDENT_AMBULATORY_CARE_PROVIDER_SITE_OTHER): Payer: 59 | Admitting: Ophthalmology

## 2011-01-12 DIAGNOSIS — H33009 Unspecified retinal detachment with retinal break, unspecified eye: Secondary | ICD-10-CM

## 2011-01-20 ENCOUNTER — Encounter (HOSPITAL_COMMUNITY)
Admission: RE | Admit: 2011-01-20 | Discharge: 2011-01-20 | Disposition: A | Discharge status: Home / Self Care | Payer: 59 | Source: Ambulatory Visit | Attending: Nephrology | Admitting: Nephrology

## 2011-01-20 DIAGNOSIS — D638 Anemia in other chronic diseases classified elsewhere: Secondary | ICD-10-CM | POA: Insufficient documentation

## 2011-01-20 DIAGNOSIS — N184 Chronic kidney disease, stage 4 (severe): Secondary | ICD-10-CM | POA: Insufficient documentation

## 2011-01-20 LAB — RENAL FUNCTION PANEL
BUN: 99 mg/dL — ABNORMAL HIGH (ref 6–23)
Calcium: 8.9 mg/dL (ref 8.4–10.5)
Creatinine, Ser: 4.89 mg/dL — ABNORMAL HIGH (ref 0.50–1.35)
GFR calc Af Amer: 14 mL/min — ABNORMAL LOW (ref >90)
Glucose, Bld: 179 mg/dL — ABNORMAL HIGH (ref 70–99)
Phosphorus: 6.4 mg/dL — ABNORMAL HIGH (ref 2.3–4.6)
Sodium: 141 mEq/L (ref 135–145)

## 2011-01-20 LAB — POCT HEMOGLOBIN-HEMACUE: Hemoglobin: 9 g/dL — ABNORMAL LOW (ref 13.0–17.0)

## 2011-01-20 LAB — IRON AND TIBC
Saturation Ratios: 25 % (ref 20–55)
UIBC: 183 ug/dL (ref 125–400)

## 2011-01-20 LAB — FERRITIN: Ferritin: 978 ng/mL — ABNORMAL HIGH (ref 22–322)

## 2011-01-20 MED ORDER — EPOETIN ALFA 10000 UNIT/ML IJ SOLN: 20000.0000 [IU] | INTRAMUSCULAR | Status: DC

## 2011-01-20 MED ORDER — EPOETIN ALFA 20000 UNIT/ML IJ SOLN
INTRAMUSCULAR | Status: AC
  Administered 2011-01-20: 20000 [IU]
  Filled 2011-01-20: qty 1

## 2011-01-21 LAB — PTH, INTACT AND CALCIUM
Calcium, Total (PTH): 8.9 mg/dL (ref 8.4–10.5)
PTH: 366.3 pg/mL — ABNORMAL HIGH (ref 14.0–72.0)

## 2011-02-03 HISTORY — PX: NEPHRECTOMY TRANSPLANTED ORGAN: SUR880

## 2011-02-17 ENCOUNTER — Encounter (HOSPITAL_COMMUNITY)
Admission: RE | Admit: 2011-02-17 | Discharge: 2011-02-17 | Disposition: A | Discharge status: Home / Self Care | Payer: 59 | Source: Ambulatory Visit | Attending: Nephrology | Admitting: Nephrology

## 2011-02-17 DIAGNOSIS — N184 Chronic kidney disease, stage 4 (severe): Secondary | ICD-10-CM | POA: Insufficient documentation

## 2011-02-17 DIAGNOSIS — D638 Anemia in other chronic diseases classified elsewhere: Secondary | ICD-10-CM | POA: Insufficient documentation

## 2011-02-17 LAB — RENAL FUNCTION PANEL
Albumin: 3.1 g/dL — ABNORMAL LOW (ref 3.5–5.2)
BUN: 91 mg/dL — ABNORMAL HIGH (ref 6–23)
Calcium: 9.5 mg/dL (ref 8.4–10.5)
Creatinine, Ser: 4.92 mg/dL — ABNORMAL HIGH (ref 0.50–1.35)
GFR calc non Af Amer: 12 mL/min — ABNORMAL LOW (ref >90)
Phosphorus: 5.3 mg/dL — ABNORMAL HIGH (ref 2.3–4.6)

## 2011-02-17 LAB — FERRITIN: Ferritin: 800 ng/mL — ABNORMAL HIGH (ref 22–322)

## 2011-02-17 MED ORDER — EPOETIN ALFA 20000 UNIT/ML IJ SOLN
INTRAMUSCULAR | Status: AC
  Administered 2011-02-17: 20000 [IU]
  Filled 2011-02-17: qty 1

## 2011-02-17 MED ORDER — EPOETIN ALFA 10000 UNIT/ML IJ SOLN: 20000.0000 [IU] | INTRAMUSCULAR | Status: DC

## 2011-02-18 LAB — PTH, INTACT AND CALCIUM
Calcium, Total (PTH): 9.1 mg/dL (ref 8.4–10.5)
PTH: 213.9 pg/mL — ABNORMAL HIGH (ref 14.0–72.0)

## 2011-02-28 ENCOUNTER — Other Ambulatory Visit: Payer: Self-pay | Admitting: Endocrinology

## 2011-03-09 ENCOUNTER — Other Ambulatory Visit (HOSPITAL_COMMUNITY): Payer: Self-pay | Admitting: *Deleted

## 2011-03-10 ENCOUNTER — Encounter (HOSPITAL_COMMUNITY)
Admission: RE | Admit: 2011-03-10 | Discharge: 2011-03-10 | Disposition: A | Discharge status: Home / Self Care | Payer: 59 | Source: Ambulatory Visit | Attending: Nephrology | Admitting: Nephrology

## 2011-03-10 DIAGNOSIS — N184 Chronic kidney disease, stage 4 (severe): Secondary | ICD-10-CM | POA: Insufficient documentation

## 2011-03-10 DIAGNOSIS — D638 Anemia in other chronic diseases classified elsewhere: Secondary | ICD-10-CM | POA: Insufficient documentation

## 2011-03-10 MED ORDER — FERUMOXYTOL INJECTION 510 MG/17 ML
510.0000 mg | INTRAVENOUS | Status: DC
  Administered 2011-03-10: 510 mg via INTRAVENOUS

## 2011-03-10 MED ORDER — FERUMOXYTOL INJECTION 510 MG/17 ML
INTRAVENOUS | Status: AC
  Administered 2011-03-10: 510 mg via INTRAVENOUS
  Filled 2011-03-10: qty 17

## 2011-03-17 ENCOUNTER — Encounter (HOSPITAL_COMMUNITY): Payer: 59

## 2011-03-17 ENCOUNTER — Encounter (HOSPITAL_COMMUNITY)
Admission: RE | Admit: 2011-03-17 | Discharge: 2011-03-17 | Disposition: A | Discharge status: Home / Self Care | Payer: 59 | Source: Ambulatory Visit | Attending: Nephrology | Admitting: Nephrology

## 2011-03-17 LAB — RENAL FUNCTION PANEL
Albumin: 3 g/dL — ABNORMAL LOW (ref 3.5–5.2)
Chloride: 105 mEq/L (ref 96–112)
GFR calc non Af Amer: 13 mL/min — ABNORMAL LOW (ref >90)
Potassium: 4.8 mEq/L (ref 3.5–5.1)

## 2011-03-17 LAB — IRON AND TIBC
Iron: 52 ug/dL (ref 42–135)
TIBC: 221 ug/dL (ref 215–435)

## 2011-03-17 MED ORDER — EPOETIN ALFA 10000 UNIT/ML IJ SOLN: 20000.0000 [IU] | INTRAMUSCULAR | Status: DC

## 2011-03-17 MED ORDER — EPOETIN ALFA 20000 UNIT/ML IJ SOLN
INTRAMUSCULAR | Status: AC
  Administered 2011-03-17: 20000 [IU] via SUBCUTANEOUS
  Filled 2011-03-17: qty 1

## 2011-03-17 MED ORDER — FERUMOXYTOL INJECTION 510 MG/17 ML
510.0000 mg | Freq: Once | INTRAVENOUS | Status: AC
  Administered 2011-03-17: 510 mg via INTRAVENOUS
  Filled 2011-03-17: qty 17

## 2011-03-18 LAB — POCT HEMOGLOBIN-HEMACUE: Hemoglobin: 8.9 g/dL — ABNORMAL LOW (ref 13.0–17.0)

## 2011-03-18 LAB — PTH, INTACT AND CALCIUM: PTH: 173.1 pg/mL — ABNORMAL HIGH (ref 14.0–72.0)

## 2011-04-06 ENCOUNTER — Encounter (INDEPENDENT_AMBULATORY_CARE_PROVIDER_SITE_OTHER): Payer: 59 | Admitting: Ophthalmology

## 2011-04-06 DIAGNOSIS — H25049 Posterior subcapsular polar age-related cataract, unspecified eye: Secondary | ICD-10-CM

## 2011-04-06 DIAGNOSIS — H43819 Vitreous degeneration, unspecified eye: Secondary | ICD-10-CM

## 2011-04-06 DIAGNOSIS — E1165 Type 2 diabetes mellitus with hyperglycemia: Secondary | ICD-10-CM

## 2011-04-06 DIAGNOSIS — H2510 Age-related nuclear cataract, unspecified eye: Secondary | ICD-10-CM

## 2011-04-06 DIAGNOSIS — E11359 Type 2 diabetes mellitus with proliferative diabetic retinopathy without macular edema: Secondary | ICD-10-CM

## 2011-04-14 ENCOUNTER — Encounter (HOSPITAL_COMMUNITY)
Admission: RE | Admit: 2011-04-14 | Discharge: 2011-04-14 | Disposition: A | Discharge status: Home / Self Care | Payer: 59 | Source: Ambulatory Visit | Attending: Nephrology | Admitting: Nephrology

## 2011-04-14 DIAGNOSIS — D638 Anemia in other chronic diseases classified elsewhere: Secondary | ICD-10-CM | POA: Insufficient documentation

## 2011-04-14 DIAGNOSIS — N184 Chronic kidney disease, stage 4 (severe): Secondary | ICD-10-CM | POA: Insufficient documentation

## 2011-04-14 LAB — RENAL FUNCTION PANEL
GFR calc Af Amer: 16 mL/min — ABNORMAL LOW (ref >90)
GFR calc non Af Amer: 13 mL/min — ABNORMAL LOW (ref >90)
Glucose, Bld: 130 mg/dL — ABNORMAL HIGH (ref 70–99)
Phosphorus: 5.4 mg/dL — ABNORMAL HIGH (ref 2.3–4.6)
Potassium: 5 mEq/L (ref 3.5–5.1)
Sodium: 137 mEq/L (ref 135–145)

## 2011-04-14 LAB — HEPATITIS B SURFACE ANTIGEN: Hepatitis B Surface Ag: NEGATIVE

## 2011-04-14 LAB — POCT HEMOGLOBIN-HEMACUE: Hemoglobin: 9.5 g/dL — ABNORMAL LOW (ref 13.0–17.0)

## 2011-04-14 LAB — IRON AND TIBC
Iron: 59 ug/dL (ref 42–135)
UIBC: 166 ug/dL (ref 125–400)

## 2011-04-14 MED ORDER — EPOETIN ALFA 20000 UNIT/ML IJ SOLN
INTRAMUSCULAR | Status: AC
  Administered 2011-04-14: 20000 [IU] via SUBCUTANEOUS
  Filled 2011-04-14: qty 1

## 2011-04-14 MED ORDER — EPOETIN ALFA 10000 UNIT/ML IJ SOLN: 20000.0000 [IU] | INTRAMUSCULAR | Status: DC

## 2011-05-11 ENCOUNTER — Other Ambulatory Visit (HOSPITAL_COMMUNITY): Payer: Self-pay | Admitting: *Deleted

## 2011-05-12 ENCOUNTER — Encounter (HOSPITAL_COMMUNITY)
Admission: RE | Admit: 2011-05-12 | Discharge: 2011-05-12 | Disposition: A | Discharge status: Home / Self Care | Payer: 59 | Source: Ambulatory Visit | Attending: Nephrology | Admitting: Nephrology

## 2011-05-12 DIAGNOSIS — D638 Anemia in other chronic diseases classified elsewhere: Secondary | ICD-10-CM | POA: Insufficient documentation

## 2011-05-12 DIAGNOSIS — N184 Chronic kidney disease, stage 4 (severe): Secondary | ICD-10-CM | POA: Insufficient documentation

## 2011-05-12 LAB — RENAL FUNCTION PANEL
Albumin: 3.1 g/dL — ABNORMAL LOW (ref 3.5–5.2)
CO2: 25 mEq/L (ref 19–32)
Chloride: 101 mEq/L (ref 96–112)
Creatinine, Ser: 6.09 mg/dL — ABNORMAL HIGH (ref 0.50–1.35)
GFR calc Af Amer: 11 mL/min — ABNORMAL LOW (ref >90)
GFR calc non Af Amer: 9 mL/min — ABNORMAL LOW (ref >90)
Potassium: 5.1 mEq/L (ref 3.5–5.1)
Sodium: 139 mEq/L (ref 135–145)

## 2011-05-12 LAB — IRON AND TIBC
Saturation Ratios: 29 % (ref 20–55)
TIBC: 226 ug/dL (ref 215–435)

## 2011-05-12 MED ORDER — EPOETIN ALFA 20000 UNIT/ML IJ SOLN
INTRAMUSCULAR | Status: AC
  Administered 2011-05-12: 20000 [IU] via SUBCUTANEOUS
  Filled 2011-05-12: qty 1

## 2011-05-12 MED ORDER — EPOETIN ALFA 10000 UNIT/ML IJ SOLN: 20000.0000 [IU] | INTRAMUSCULAR | Status: DC

## 2011-05-13 LAB — PTH, INTACT AND CALCIUM: PTH: 61.1 pg/mL (ref 14.0–72.0)

## 2011-07-24 ENCOUNTER — Other Ambulatory Visit (HOSPITAL_COMMUNITY): Payer: Self-pay | Admitting: *Deleted

## 2011-07-27 ENCOUNTER — Encounter (HOSPITAL_COMMUNITY): Payer: 59

## 2011-08-10 ENCOUNTER — Ambulatory Visit (INDEPENDENT_AMBULATORY_CARE_PROVIDER_SITE_OTHER): Payer: 59 | Admitting: Ophthalmology

## 2011-12-18 ENCOUNTER — Telehealth: Payer: Self-pay | Admitting: *Deleted

## 2011-12-18 NOTE — Telephone Encounter (Signed)
Patient notified by phone cell # of need to schedule office visit with Dr. Everardo All. Left message of need to call office and schedule office visti to go over lab results done 12/16/2011. Phone number and address of Dr. Everardo All office given.

## 2012-01-06 ENCOUNTER — Ambulatory Visit (INDEPENDENT_AMBULATORY_CARE_PROVIDER_SITE_OTHER): Payer: 59 | Admitting: Endocrinology

## 2012-01-06 ENCOUNTER — Encounter: Payer: Self-pay | Admitting: Endocrinology

## 2012-01-06 VITALS — BP 122/76 | HR 87 | Temp 98.0°F | Wt 134.0 lb

## 2012-01-06 DIAGNOSIS — E785 Hyperlipidemia, unspecified: Secondary | ICD-10-CM

## 2012-01-06 DIAGNOSIS — E109 Type 1 diabetes mellitus without complications: Secondary | ICD-10-CM

## 2012-01-06 DIAGNOSIS — Z125 Encounter for screening for malignant neoplasm of prostate: Secondary | ICD-10-CM | POA: Insufficient documentation

## 2012-01-06 LAB — TSH: TSH: 0.978 u[IU]/mL (ref 0.350–4.500)

## 2012-01-06 LAB — LIPID PANEL
Cholesterol: 240 mg/dL — ABNORMAL HIGH (ref 0–200)
HDL: 50 mg/dL (ref >39)

## 2012-01-06 LAB — HEMOGLOBIN A1C
Hgb A1c MFr Bld: 12.9 % — ABNORMAL HIGH (ref <5.7)
Mean Plasma Glucose: 324 mg/dL — ABNORMAL HIGH (ref <117)

## 2012-01-06 LAB — PSA: PSA: 1.59 ng/mL (ref <=4.00)

## 2012-01-06 NOTE — Progress Notes (Signed)
Subjective:    Patient ID: Steven Riddle, male    DOB: 1955-06-07, 56 y.o.   MRN: [id number]  HPI Pt returns for f/u of insulin-requiring DM (dx'ed 1998; complicated by renal failure, proliferative retinopathy, and peripheral sensory neuropathy; he had renal transplant in 2013 in [place]).  no cbg record, but states cbg's vary from 95-300.  It is in general higher as the day goes on.  He takes the levemir at hs.    Past Medical History  Diagnosis Date  . DIABETES MELLITUS, TYPE I 01/10/2007  . HYPERLIPIDEMIA 01/10/2007  . HYPERKALEMIA 04/09/2009  . ANEMIA 02/15/2007  . ANXIETY 05/23/2007  . DEPRESSION 01/10/2007  . PERIPHERAL NEUROPATHY 01/10/2007  . HYPERTENSION 01/10/2007  . RENAL INSUFFICIENCY, CHRONIC 06/07/2008  . 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 Palpitations 03/19/2008  . Proteinuria 06/07/2008    No past surgical history on file.  History   Social History  . Marital Status: Married    Spouse Name: N/A    Number of Children: N/A  . Years of Education: N/A   Occupational History  . Owns [employer]    Social History Main Topics  . Smoking status: Never Smoker   . Smokeless tobacco: Not on file  . Alcohol Use: Not on file  . Drug Use: Not on file  . Sexually Active: Not on file   Other Topics Concern  . Not on file   Social History Narrative   Owns Hotels    Current Outpatient Prescriptions on File Prior to Visit  Medication Sig Dispense Refill  . aspirin 81 MG tablet Take 81 mg by mouth daily.        . BD ULTRA-FINE PEN NEEDLES 29G X 12.7MM MISC USE AS DIRECTED  100 each  5  . insulin detemir (LEVEMIR FLEXPEN) 100 UNIT/ML injection       . vardenafil (LEVITRA) 20 MG tablet as needed.        . rosuvastatin (CRESTOR) 10 MG tablet Take 1 tablet (10 mg total) by mouth daily.  90 tablet  3    Allergies  Allergen Reactions  . Pioglitazone     Family History  Problem Relation Age of Onset  . Cancer Neg Hx     BP 122/76  Pulse 87  Temp 98 F (36.7 C)  (Oral)  Wt 134 lb (60.782 kg)  SpO2 97%    Review of Systems denies hypoglycemia    Objective:   Physical Exam VITAL SIGNS:  See vs page GENERAL: no distress Pulses: dorsalis pedis intact bilat.   Feet: no deformity.  no ulcer on the feet.  feet are of normal color and temp.  no edema.  There is bilateral onychomycosis.   Neuro: sensation is intact to touch on the feet, but decreased from normal.    Lab Results  Component Value Date   WBC 8.4 12/04/2010   HGB 10.3* 05/12/2011   HCT 29.2* 12/04/2010   PLT 143* 12/04/2010   GLUCOSE 205* 05/12/2011   CHOL 240* 01/06/2012   TRIG 132 01/06/2012   HDL 50 01/06/2012   LDLCALC 164* 01/06/2012   ALT 26 06/05/2010   AST 22 06/05/2010   NA 139 05/12/2011   K 5.1 05/12/2011   CL 101 05/12/2011   CREATININE 6.09* 05/12/2011   BUN 99* 05/12/2011   CO2 25 05/12/2011   TSH 0.978 01/06/2012   PSA 1.59 01/06/2012   HGBA1C 12.9* 01/06/2012   MICROALBUR 155.7* 02/10/2007      Assessment & Plan:  DM:  therapy limited by severe noncompliance with f/u appts, and by pt's need for a simple regimen  i'll do the best i can. Dyslipidemia, needs increased rx Immunosuppression, due to transplantation.  There is an interaction between sandimmune and statins, so we'll have to use a low statin dosage.

## 2012-01-06 NOTE — Patient Instructions (Addendum)
Please increase levemir to 40 units daily, and take it in the morning.   Please come back for a regular physical appointment in 3 months.   check your blood sugar twice a day.  vary the time of day when you check, between before the 3 meals, and at bedtime.  also check if you have symptoms of your blood sugar being too high or too low.  please keep a record of the readings and bring it to your next appointment here.  please call us sooner if your blood sugar goes below 70, or if you have a lot of readings over 200.

## 2012-01-07 MED ORDER — ROSUVASTATIN CALCIUM 10 MG PO TABS: 10.0000 mg | ORAL_TABLET | Freq: Every day | ORAL | Status: DC

## 2012-01-08 ENCOUNTER — Other Ambulatory Visit: Payer: Self-pay

## 2012-01-08 MED ORDER — ROSUVASTATIN CALCIUM 10 MG PO TABS: 10.0000 mg | ORAL_TABLET | Freq: Every day | ORAL | Status: DC

## 2012-05-19 ENCOUNTER — Ambulatory Visit (INDEPENDENT_AMBULATORY_CARE_PROVIDER_SITE_OTHER): Payer: BC Managed Care – PPO | Admitting: Endocrinology

## 2012-05-19 ENCOUNTER — Encounter: Payer: Self-pay | Admitting: Endocrinology

## 2012-05-19 VITALS — BP 114/70 | HR 75 | Wt 143.0 lb

## 2012-05-19 DIAGNOSIS — E109 Type 1 diabetes mellitus without complications: Secondary | ICD-10-CM

## 2012-05-19 NOTE — Patient Instructions (Addendum)
Please increase levemir to 40 units daily, and take it in the morning.   Please come back for a regular physical appointment in 2 months.   check your blood sugar twice a day.  vary the time of day when you check, between before the 3 meals, and at bedtime.  also check if you have symptoms of your blood sugar being too high or too low.  please keep a record of the readings and bring it to your next appointment here.  please call us sooner if your blood sugar goes below 70, or if you have a lot of readings over 200.

## 2012-05-19 NOTE — Progress Notes (Signed)
Subjective:    Patient ID: Steven Riddle, male    DOB: April 21, 1955, 57 y.o.   MRN: [id number]  HPI Pt returns for f/u of insulin-requiring DM (dx'ed 1998; complicated by renal failure, proliferative retinopathy, and peripheral sensory neuropathy; he had renal transplant in 2013 in [place]; therapy has been limited by pt's need for a simple regimen).  no cbg record, but states cbg's vary from 80-240.  It is in general higher as the day goes on.  He takes levemir 15 units qam and 20 units qpm.   Past Medical History  Diagnosis Date  . DIABETES MELLITUS, TYPE I 01/10/2007  . HYPERLIPIDEMIA 01/10/2007  . HYPERKALEMIA 04/09/2009  . ANEMIA 02/15/2007  . ANXIETY 05/23/2007  . DEPRESSION 01/10/2007  . PERIPHERAL NEUROPATHY 01/10/2007  . HYPERTENSION 01/10/2007  . RENAL INSUFFICIENCY, CHRONIC 06/07/2008  . 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 Palpitations 03/19/2008  . Proteinuria 06/07/2008    No past surgical history on file.  History   Social History  . Marital Status: Married    Spouse Name: N/A    Number of Children: N/A  . Years of Education: N/A   Occupational History  . Owns [employer]    Social History Main Topics  . Smoking status: Never Smoker   . Smokeless tobacco: Not on file  . Alcohol Use: Not on file  . Drug Use: Not on file  . Sexually Active: Not on file   Other Topics Concern  . Not on file   Social History Narrative   Owns Hotels    Current Outpatient Prescriptions on File Prior to Visit  Medication Sig Dispense Refill  . aspirin 81 MG tablet Take 81 mg by mouth daily.        . BD ULTRA-FINE PEN NEEDLES 29G X 12.7MM MISC USE AS DIRECTED  100 each  5  . cycloSPORINE (SANDIMMUNE) 100 MG capsule 100 mg each morning, and 50 mg each evening      . fluconazole (DIFLUCAN) 150 MG tablet Take 150 mg by mouth daily.      . insulin detemir (LEVEMIR FLEXPEN) 100 UNIT/ML injection Inject 40 Units into the skin every morning.       . mycophenolate (MYFORTIC) 180 MG EC tablet  Take by mouth daily.      . predniSONE (DELTASONE) 5 MG tablet Take 5 mg by mouth daily.      . rosuvastatin (CRESTOR) 10 MG tablet Take 1 tablet (10 mg total) by mouth daily.  90 tablet  3  . sulfamethoxazole-trimethoprim (BACTRIM DS) 800-160 MG per tablet Take 1 tablet by mouth daily.      . valACYclovir (VALTREX) 500 MG tablet Take 500 mg by mouth daily.      . vardenafil (LEVITRA) 20 MG tablet as needed.         No current facility-administered medications on file prior to visit.    Allergies  Allergen Reactions  . Pioglitazone     Family History  Problem Relation Age of Onset  . Cancer Neg Hx     BP 114/70  Pulse 75  Wt 143 lb (64.864 kg)  BMI 23.09 kg/m2  SpO2 98%  Review of Systems Denies weight change.      Objective:   Physical Exam VITAL SIGNS:  See vs page GENERAL: no distress SKIN:  Insulin injection sites at the anterior abdomen are normal    outside test results are reviewed: A1c=12.3    Assessment & Plan:  DM: therapy limited by noncompliance with cbg's,  f/u appts, and insulin dosing.  i'll do the best i can.We discussed the fact that the levemir dosing needs to be anticipatory, as it is a slow-release drug.  We discussed the need to take all if the levmir in the morning, based on the pattern of cbg's he is recording.

## 2012-05-30 ENCOUNTER — Other Ambulatory Visit: Payer: Self-pay | Admitting: Endocrinology

## 2012-05-30 DIAGNOSIS — Z0279 Encounter for issue of other medical certificate: Secondary | ICD-10-CM

## 2012-05-30 MED ORDER — ROSUVASTATIN CALCIUM 10 MG PO TABS: 10.0000 mg | ORAL_TABLET | Freq: Every day | ORAL | Status: DC

## 2013-03-31 ENCOUNTER — Telehealth: Payer: Self-pay | Admitting: Endocrinology

## 2013-03-31 NOTE — Telephone Encounter (Signed)
please call patient: Ov is due 

## 2013-04-03 NOTE — Telephone Encounter (Signed)
Called pt and scheduled for 04/05/2013 at 1:15.

## 2013-04-05 ENCOUNTER — Ambulatory Visit (INDEPENDENT_AMBULATORY_CARE_PROVIDER_SITE_OTHER): Payer: Medicare Other | Admitting: Endocrinology

## 2013-04-05 ENCOUNTER — Encounter: Payer: Self-pay | Admitting: Endocrinology

## 2013-04-05 VITALS — BP 128/74 | HR 83 | Temp 98.2°F | Ht 66.0 in | Wt 144.0 lb

## 2013-04-05 DIAGNOSIS — Z23 Encounter for immunization: Secondary | ICD-10-CM

## 2013-04-05 DIAGNOSIS — Z0000 Encounter for general adult medical examination without abnormal findings: Secondary | ICD-10-CM

## 2013-04-05 DIAGNOSIS — E1029 Type 1 diabetes mellitus with other diabetic kidney complication: Secondary | ICD-10-CM

## 2013-04-05 DIAGNOSIS — E109 Type 1 diabetes mellitus without complications: Secondary | ICD-10-CM

## 2013-04-05 NOTE — Progress Notes (Signed)
Subjective:    Patient ID: Steven Riddle, male    DOB: February 14, 1955, 58 y.o.   MRN: [id number]  HPI Pt is here for regular wellness examination, and is feeling pretty well in general, and says chronic med probs are stable, except as noted below.  He refuses DRE and colonoscopy Past Medical History  Diagnosis Date  . DIABETES MELLITUS, TYPE I 01/10/2007  . HYPERLIPIDEMIA 01/10/2007  . HYPERKALEMIA 04/09/2009  . ANEMIA 02/15/2007  . ANXIETY 05/23/2007  . DEPRESSION 01/10/2007  . PERIPHERAL NEUROPATHY 01/10/2007  . HYPERTENSION 01/10/2007  . RENAL INSUFFICIENCY, CHRONIC 06/07/2008  . 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 Palpitations 03/19/2008  . Proteinuria 06/07/2008    No past surgical history on file.  History   Social History  . Marital Status: Married    Spouse Name: N/A    Number of Children: N/A  . Years of Education: N/A   Occupational History  . Owns [employer]    Social History Main Topics  . Smoking status: Never Smoker   . Smokeless tobacco: Not on file  . Alcohol Use: Not on file  . Drug Use: Not on file  . Sexual Activity: Not on file   Other Topics Concern  . Not on file   Social History Narrative   Owns Hotels    Current Outpatient Prescriptions on File Prior to Visit  Medication Sig Dispense Refill  . aspirin 81 MG tablet Take 81 mg by mouth daily.        . BD ULTRA-FINE PEN NEEDLES 29G X 12.7MM MISC USE AS DIRECTED  100 each  5  . cycloSPORINE (SANDIMMUNE) 100 MG capsule 100 mg each morning, and 50 mg each evening      . fluconazole (DIFLUCAN) 150 MG tablet Take 150 mg by mouth daily.      . insulin detemir (LEVEMIR FLEXPEN) 100 UNIT/ML injection 30 units each morning, and 20 units each evening      . mycophenolate (MYFORTIC) 180 MG EC tablet Take by mouth daily.      . predniSONE (DELTASONE) 5 MG tablet Take 5 mg by mouth daily.      . rosuvastatin (CRESTOR) 10 MG tablet Take 1 tablet (10 mg total) by mouth daily.  90 tablet  3  .  sulfamethoxazole-trimethoprim (BACTRIM DS) 800-160 MG per tablet Take 1 tablet by mouth daily.      . valACYclovir (VALTREX) 500 MG tablet Take 500 mg by mouth daily.      . vardenafil (LEVITRA) 20 MG tablet as needed.         No current facility-administered medications on file prior to visit.    Allergies  Allergen Reactions  . Pioglitazone     Family History  Problem Relation Age of Onset  . Cancer Neg Hx     BP 128/74  Pulse 83  Temp(Src) 98.2 F (36.8 C) (Oral)  Ht 5\' 6"  (1.676 m)  Wt 144 lb (65.318 kg)  BMI 23.25 kg/m2  SpO2 97%  Review of Systems  Constitutional: Negative for fever.  HENT: Negative for hearing loss.   Eyes:       Chronic blurry vision  Respiratory: Negative for shortness of breath.   Cardiovascular: Negative for chest pain.  Gastrointestinal: Negative for anal bleeding.  Endocrine: Negative for cold intolerance.  Genitourinary: Negative for hematuria.  Musculoskeletal: Negative for back pain.  Skin: Negative for rash.  Allergic/Immunologic: Negative for environmental allergies.  Neurological: Negative for syncope and numbness.  Psychiatric/Behavioral: Negative for dysphoric  mood.       Objective:   Physical Exam VS: see vs page GEN: no distress HEAD: head: no deformity eyes: no periorbital swelling, no proptosis external nose and ears are normal mouth: no lesion seen NECK: supple, thyroid is not enlarged CHEST WALL: no deformity LUNGS: clear to auscultation BREASTS:  No gynecomastia CV: reg rate and rhythm, no murmur ABD: abdomen is soft, nontender.  no hepatosplenomegaly.  not distended.  no hernia.  Old healed surgical scar (renal transplant) GENITALIA/RECTAL/PROSTATE:  declined MUSCULOSKELETAL: muscle bulk and strength are grossly normal.  no obvious joint swelling.  gait is normal and steady PULSES: no carotid bruit NEURO:  cn 2-12 grossly intact.   readily moves all 4's.   SKIN:  Normal texture and temperature.  No rash or  suspicious lesion is visible.   NODES:  None palpable at the neck PSYCH: alert, well-oriented.  Does not appear anxious nor depressed.   i reviewed electrocardiogram: no significany change since 2010    Assessment & Plan:  Wellness visit today, with problems stable, except as noted. we discussed code status.  pt requests full code, but would not want to be started or maintained on artificial life-support measures if there was not a reasonable chance of recovery.       SEPARATE EVALUATION FOLLOWS--EACH PROBLEM HERE IS NEW, NOT RESPONDING TO TREATMENT, OR POSES SIGNIFICANT RISK TO THE PATIENT'S HEALTH: HISTORY OF THE PRESENT ILLNESS: Pt returns for f/u of insulin-requiring DM (dx'ed 1998; he has been on insulin since approx 2005; he has moderate neuropathy of the lower extremities; he has associated renal failure, and proliferative retinopathy; he had renal transplant in 2013 in [place]; therapy has been limited by pt's request for a simple regimen, and chronic noncompliance; he has never had severe hypoglycemia or DKA).  no cbg record, but states cbg's vary from 86-270.  It is in general higher as the day goes on.  He takes levemir 20 units qam and 25 units qpm.  PAST MEDICAL HISTORY.  reviewed and up to date today.   REVIEW OF SYSTEMS: He denies hypoglycemia and weight change PHYSICAL EXAMINATION: VITAL SIGNS:  See vs page. GENERAL: no distress.  LAB/XRAY RESULTS: outside test results are reviewed: a1c is vastly elevated.  IMPRESSION: Diabetes: very poor control noncompliance with insulin, f/u appts, and cbg monitoring.  This causes high risk to his health.  i'll do the best i can.   PLAN: See instruction page

## 2013-04-05 NOTE — Patient Instructions (Addendum)
Please increase levemir to 30 units each morning, and 20 units each evening.   please consider these measures for your health:  minimize alcohol.  do not use tobacco products.  have a colonoscopy at least every 10 years from age 60.  keep firearms safely stored.  always use seat belts.  have working smoke alarms in your home.  see an eye doctor and dentist regularly.  never drive under the influence of alcohol or drugs (including prescription drugs).   Please come back for a follow-up appointment in 3 months.

## 2013-04-06 DIAGNOSIS — Z0000 Encounter for general adult medical examination without abnormal findings: Secondary | ICD-10-CM | POA: Insufficient documentation

## 2013-10-10 ENCOUNTER — Encounter: Payer: Self-pay | Admitting: Endocrinology

## 2013-10-16 ENCOUNTER — Ambulatory Visit (INDEPENDENT_AMBULATORY_CARE_PROVIDER_SITE_OTHER): Payer: Medicare Other | Admitting: Endocrinology

## 2013-10-16 ENCOUNTER — Encounter: Payer: Self-pay | Admitting: Endocrinology

## 2013-10-16 VITALS — BP 122/72 | HR 85 | Temp 98.2°F | Ht 66.0 in | Wt 144.0 lb

## 2013-10-16 DIAGNOSIS — E1029 Type 1 diabetes mellitus with other diabetic kidney complication: Secondary | ICD-10-CM

## 2013-10-16 LAB — HEMOGLOBIN A1C: HEMOGLOBIN A1C: 13.3 % — AB (ref 4.6–6.5)

## 2013-10-16 NOTE — Patient Instructions (Addendum)
blood tests are being requested for you today.  We'll contact you with results. Please change levemir to 25 units twice a day, and: humalog 4 units for any blood sugar in the 200's, and 10 units for any over 300.  check your blood sugar 4 times a day: before the 3 meals, and at bedtime.  also check if you have symptoms of your blood sugar being too high or too low.  please keep a record of the readings and bring it to your next appointment here.  You can write it on any piece of paper.  please call us sooner if your blood sugar goes below 70, or if you have a lot of readings over 200.   Please continue crestor, as it interacts less with your other meds.

## 2013-10-16 NOTE — Progress Notes (Signed)
Subjective:    Patient ID: Steven Riddle, male    DOB: 02/14/1955, 58 y.o.   MRN: [id number]  HPI Pt returns for f/u of insulin-requiring DM (dx'ed 1998; he has been on insulin since approx 2005; he has moderate neuropathy of the lower extremities; he has associated renal failure, and proliferative retinopathy; he had renal transplant in 2013 in [place]; therapy has been limited by pt's request for a simple regimen, and chronic noncompliance; he has never had pancreatitis, severe hypoglycemia, or DKA).  no cbg record, but states cbg's vary from 98-213.  It is in general higher as the day goes on.  He takes levemir 15 units qam and 35 units qpm.  He also takes humalog, 15 units with lunch.  He has slight swelling of the legs, but no assoc sob.   He wants to change crestor to lipitor, due to cost. Past Medical History  Diagnosis Date  . DIABETES MELLITUS, TYPE I 01/10/2007  . HYPERLIPIDEMIA 01/10/2007  . HYPERKALEMIA 04/09/2009  . ANEMIA 02/15/2007  . ANXIETY 05/23/2007  . DEPRESSION 01/10/2007  . PERIPHERAL NEUROPATHY 01/10/2007  . HYPERTENSION 01/10/2007  . RENAL INSUFFICIENCY, CHRONIC 06/07/2008  . 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 Palpitations 03/19/2008  . Proteinuria 06/07/2008    No past surgical history on file.  History   Social History  . Marital Status: Married    Spouse Name: N/A    Number of Children: N/A  . Years of Education: N/A   Occupational History  . Owns [employer]    Social History Main Topics  . Smoking status: Never Smoker   . Smokeless tobacco: Not on file  . Alcohol Use: Not on file  . Drug Use: Not on file  . Sexual Activity: Not on file   Other Topics Concern  . Not on file   Social History Narrative   Owns [employer]    Current Outpatient Prescriptions on File Prior to Visit  Medication Sig Dispense Refill  . aspirin 81 MG tablet Take 81 mg by mouth daily.        . BD ULTRA-FINE PEN NEEDLES 29G X 12.7MM MISC USE AS DIRECTED  100 each  5  .  cycloSPORINE (SANDIMMUNE) 100 MG capsule 100 mg each morning, and 50 mg each evening      . fluconazole (DIFLUCAN) 150 MG tablet Take 150 mg by mouth daily.      . insulin detemir (LEVEMIR FLEXPEN) 100 UNIT/ML injection 25 Units 2 (two) times daily.       . mycophenolate (MYFORTIC) 180 MG EC tablet Take by mouth daily.      . predniSONE (DELTASONE) 5 MG tablet Take 5 mg by mouth daily.       No current facility-administered medications on file prior to visit.    Allergies  Allergen Reactions  . Pioglitazone     Family History  Problem Relation Age of Onset  . Cancer Neg Hx     BP 122/72  Pulse 85  Temp(Src) 98.2 F (36.8 C) (Oral)  Ht  (1.676 m)  Wt 144 lb (65.318 kg)  BMI 23.25 kg/m2  SpO2 96%   Review of Systems He denies hypoglycemia and weight change.      Objective:   Physical Exam VITAL SIGNS:  See vs page GENERAL: no distress Pulses: dorsalis pedis intact bilat.   Feet: no deformity.  no edema. Skin:  no ulcer on the feet.  normal color and temp. Neuro: sensation is intact to touch on the  feet, but decreased from normal.     Lab Results  Component Value Date   HGBA1C 13.3* 10/16/2013       Assessment & Plan:  DM: severe exacerbation Noncompliance with cbg recording, insulin dosing, and f/u appts: I'll work around this as best I can. Dyslipidemia: therapy limited by drug interactions. Edema, mild, uncertain etiology.  No rx needed, except f/u with nephrol.    Patient is advised the following: Patient Instructions  blood tests are being requested for you today.  We'll contact you with results. Please change levemir to 25 units twice a day, and: humalog 4 units for any blood sugar in the 200's, and 10 units for any over 300.  check your blood sugar 4 times a day: before the 3 meals, and at bedtime.  also check if you have symptoms of your blood sugar being too high or too low.  please keep a record of the readings and bring it to your next  appointment here.  You can write it on any piece of paper.  please call us sooner if your blood sugar goes below 70, or if you have a lot of readings over 200.   Please continue crestor, as it interacts less with your other meds.

## 2013-11-20 ENCOUNTER — Encounter: Payer: Self-pay | Admitting: Endocrinology

## 2013-11-20 ENCOUNTER — Ambulatory Visit (INDEPENDENT_AMBULATORY_CARE_PROVIDER_SITE_OTHER): Payer: Medicare Other | Admitting: Endocrinology

## 2013-11-20 VITALS — BP 130/64 | HR 82 | Temp 98.9°F | Ht 66.0 in | Wt 144.0 lb

## 2013-11-20 DIAGNOSIS — I739 Peripheral vascular disease, unspecified: Secondary | ICD-10-CM | POA: Insufficient documentation

## 2013-11-20 DIAGNOSIS — N189 Chronic kidney disease, unspecified: Secondary | ICD-10-CM

## 2013-11-20 DIAGNOSIS — E1022 Type 1 diabetes mellitus with diabetic chronic kidney disease: Secondary | ICD-10-CM

## 2013-11-20 DIAGNOSIS — Z23 Encounter for immunization: Secondary | ICD-10-CM

## 2013-11-20 LAB — HEMOGLOBIN A1C: HEMOGLOBIN A1C: 13.1 % — AB (ref 4.6–6.5)

## 2013-11-20 NOTE — Progress Notes (Signed)
Subjective:    Patient ID: Steven Riddle, male    DOB: 1956-01-23, 58 y.o.   MRN: [id number]  HPI Pt returns for f/u of diabetes mellitus: DM type: Insulin-requiring type 2 Dx'ed: 1998 Complications: polyneuropathy of the lower extremities, renal failure, and proliferative retinopathy Therapy: insulin since 2005 DKA: never Severe hypoglycemia: never Pancreatitis: never Other: he had renal transplant in 2013 in [place]; therapy has been limited by pt's request for a simple regimen, and chronic noncompliance Interval history:  no cbg record, but states cbg's vary from 98-213.  It is in general higher as the day goes on.  He takes levemir, none in the morning, and and 30 units qpm.  He also takes humalog, 20 units with lunch.  Past Medical History  Diagnosis Date  . DIABETES MELLITUS, TYPE I 01/10/2007  . HYPERLIPIDEMIA 01/10/2007  . HYPERKALEMIA 04/09/2009  . ANEMIA 02/15/2007  . ANXIETY 05/23/2007  . DEPRESSION 01/10/2007  . PERIPHERAL NEUROPATHY 01/10/2007  . HYPERTENSION 01/10/2007  . RENAL INSUFFICIENCY, CHRONIC 06/07/2008  . 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 Palpitations 03/19/2008  . Proteinuria 06/07/2008    No past surgical history on file.  History   Social History  . Marital Status: Married    Spouse Name: N/A    Number of Children: N/A  . Years of Education: N/A   Occupational History  . Owns [employer]    Social History Main Topics  . Smoking status: Never Smoker   . Smokeless tobacco: Not on file  . Alcohol Use: Not on file  . Drug Use: Not on file  . Sexual Activity: Not on file   Other Topics Concern  . Not on file   Social History Narrative   Owns [employer]    Current Outpatient Prescriptions on File Prior to Visit  Medication Sig Dispense Refill  . aspirin 81 MG tablet Take 81 mg by mouth daily.        . BD ULTRA-FINE PEN NEEDLES 29G X 12.7MM MISC USE AS DIRECTED  100 each  5  . cycloSPORINE (SANDIMMUNE) 100 MG capsule 100 mg each morning, and 50  mg each evening      . fluconazole (DIFLUCAN) 150 MG tablet Take 150 mg by mouth daily.      . insulin detemir (LEVEMIR FLEXPEN) 100 UNIT/ML injection 25 Units 2 (two) times daily.       . mycophenolate (MYFORTIC) 180 MG EC tablet Take by mouth daily.      . predniSONE (DELTASONE) 5 MG tablet Take 5 mg by mouth daily.      . rosuvastatin (CRESTOR) 10 MG tablet Take 10 mg by mouth daily.       No current facility-administered medications on file prior to visit.    Allergies  Allergen Reactions  . Pioglitazone     Family History  Problem Relation Age of Onset  . Cancer Neg Hx     BP 130/64  Pulse 82  Temp(Src) 98.9 F (37.2 C) (Oral)  Ht 5\' 6"  (1.676 m)  Wt 144 lb (65.318 kg)  BMI 23.25 kg/m2  SpO2 96%     Review of Systems He denies hypoglycemia and weight change.      Objective:   Physical Exam VITAL SIGNS:  See vs page.   GENERAL: no distress.   Pulses: dorsalis pedis decreased bilat.   Feet: no deformity.  no edema.   Skin:  no ulcer on the feet.  normal color and temp. Neuro: sensation is intact to touch on  the feet.     Lab Results  Component Value Date   HGBA1C 13.1* 11/20/2013      Assessment & Plan:  DM: severe exacerbation Severe chronic noncompliance with cbg recording and insulin dosing: I'll work around this as best I can. pulselessness of the feet, new.  Patient is advised the following: Patient Instructions  blood tests are being requested for you today.  We'll contact you with results. check your blood sugar 4 times a day: before the 3 meals, and at bedtime.  also check if you have symptoms of your blood sugar being too high or too low.  please keep a record of the readings and bring it to your next appointment here.  You can write it on any piece of paper.  please call us sooner if your blood sugar goes below 70, or if you have a lot of readings over 200.   Please see Dr Sharyn LullHarwani, for your circulation.  Please ask that he send a report here.     Please come back for a follow-up appointment in 3 months.

## 2013-11-20 NOTE — Patient Instructions (Addendum)
blood tests are being requested for you today.  We'll contact you with results. check your blood sugar 4 times a day: before the 3 meals, and at bedtime.  also check if you have symptoms of your blood sugar being too high or too low.  please keep a record of the readings and bring it to your next appointment here.  You can write it on any piece of paper.  please call us sooner if your blood sugar goes below 70, or if you have a lot of readings over 200.   Please see Dr Sharyn LullHarwani, for your circulation.  Please ask that he send a report here.   Please come back for a follow-up appointment in 3 months.

## 2013-11-29 ENCOUNTER — Other Ambulatory Visit (HOSPITAL_COMMUNITY): Payer: Self-pay | Admitting: Cardiology

## 2013-11-29 ENCOUNTER — Other Ambulatory Visit: Payer: Self-pay | Admitting: Nurse Practitioner

## 2013-11-29 DIAGNOSIS — R0989 Other specified symptoms and signs involving the circulatory and respiratory systems: Secondary | ICD-10-CM

## 2013-11-29 DIAGNOSIS — R209 Unspecified disturbances of skin sensation: Secondary | ICD-10-CM

## 2013-11-30 ENCOUNTER — Ambulatory Visit (HOSPITAL_COMMUNITY)
Admission: RE | Admit: 2013-11-30 | Discharge: 2013-11-30 | Disposition: A | Discharge status: Home / Self Care | Payer: Medicare Other | Source: Ambulatory Visit | Attending: Surgery | Admitting: Surgery

## 2013-11-30 DIAGNOSIS — E119 Type 2 diabetes mellitus without complications: Secondary | ICD-10-CM | POA: Insufficient documentation

## 2013-11-30 DIAGNOSIS — R2689 Other abnormalities of gait and mobility: Secondary | ICD-10-CM | POA: Insufficient documentation

## 2013-11-30 DIAGNOSIS — R0989 Other specified symptoms and signs involving the circulatory and respiratory systems: Secondary | ICD-10-CM | POA: Diagnosis present

## 2013-11-30 DIAGNOSIS — R208 Other disturbances of skin sensation: Secondary | ICD-10-CM | POA: Insufficient documentation

## 2013-11-30 DIAGNOSIS — R209 Unspecified disturbances of skin sensation: Secondary | ICD-10-CM

## 2013-11-30 NOTE — Progress Notes (Signed)
VASCULAR LAB PRELIMINARY  ARTERIAL  ABI completed: ABI appears within normal limits.   RIGHT    LEFT    PRESSURE WAVEFORM  PRESSURE WAVEFORM  BRACHIAL NA- fistula in arm  BRACHIAL 160 Tri  DP   DP    AT 130 Bi AT 169 Bi  PT 173 Tri PT 155 Tri  PER   PER    GREAT TOE  NA GREAT TOE  NA    RIGHT LEFT  ABI 1.08 1.06     Farrel DemarkJill Eunice, RDMS, RVT  11/30/2013, 3:37 PM

## 2014-02-05 ENCOUNTER — Encounter: Payer: Self-pay | Admitting: Endocrinology

## 2014-05-29 ENCOUNTER — Emergency Department (HOSPITAL_COMMUNITY): Payer: PPO

## 2014-05-29 ENCOUNTER — Encounter (HOSPITAL_COMMUNITY): Payer: Self-pay | Admitting: Emergency Medicine

## 2014-05-29 ENCOUNTER — Inpatient Hospital Stay (HOSPITAL_COMMUNITY)
Admission: EM | Admit: 2014-05-29 | Discharge: 2014-06-08 | DRG: 246 | Disposition: A | Discharge status: Home Health | Payer: PPO | Attending: Internal Medicine | Admitting: Internal Medicine

## 2014-05-29 DIAGNOSIS — J9601 Acute respiratory failure with hypoxia: Secondary | ICD-10-CM | POA: Diagnosis present

## 2014-05-29 DIAGNOSIS — R0602 Shortness of breath: Secondary | ICD-10-CM

## 2014-05-29 DIAGNOSIS — R7989 Other specified abnormal findings of blood chemistry: Secondary | ICD-10-CM | POA: Diagnosis present

## 2014-05-29 DIAGNOSIS — N179 Acute kidney failure, unspecified: Secondary | ICD-10-CM | POA: Diagnosis not present

## 2014-05-29 DIAGNOSIS — Z888 Allergy status to other drugs, medicaments and biological substances status: Secondary | ICD-10-CM | POA: Diagnosis not present

## 2014-05-29 DIAGNOSIS — I452 Bifascicular block: Secondary | ICD-10-CM | POA: Diagnosis present

## 2014-05-29 DIAGNOSIS — Z7952 Long term (current) use of systemic steroids: Secondary | ICD-10-CM

## 2014-05-29 DIAGNOSIS — Z452 Encounter for adjustment and management of vascular access device: Secondary | ICD-10-CM

## 2014-05-29 DIAGNOSIS — Z79899 Other long term (current) drug therapy: Secondary | ICD-10-CM

## 2014-05-29 DIAGNOSIS — Z8249 Family history of ischemic heart disease and other diseases of the circulatory system: Secondary | ICD-10-CM | POA: Diagnosis not present

## 2014-05-29 DIAGNOSIS — I2510 Atherosclerotic heart disease of native coronary artery without angina pectoris: Secondary | ICD-10-CM | POA: Diagnosis present

## 2014-05-29 DIAGNOSIS — I951 Orthostatic hypotension: Secondary | ICD-10-CM | POA: Diagnosis not present

## 2014-05-29 DIAGNOSIS — E785 Hyperlipidemia, unspecified: Secondary | ICD-10-CM | POA: Diagnosis present

## 2014-05-29 DIAGNOSIS — Z833 Family history of diabetes mellitus: Secondary | ICD-10-CM | POA: Diagnosis not present

## 2014-05-29 DIAGNOSIS — Z794 Long term (current) use of insulin: Secondary | ICD-10-CM | POA: Diagnosis not present

## 2014-05-29 DIAGNOSIS — E876 Hypokalemia: Secondary | ICD-10-CM | POA: Diagnosis not present

## 2014-05-29 DIAGNOSIS — J9621 Acute and chronic respiratory failure with hypoxia: Secondary | ICD-10-CM | POA: Diagnosis present

## 2014-05-29 DIAGNOSIS — D638 Anemia in other chronic diseases classified elsewhere: Secondary | ICD-10-CM | POA: Diagnosis present

## 2014-05-29 DIAGNOSIS — E1042 Type 1 diabetes mellitus with diabetic polyneuropathy: Secondary | ICD-10-CM | POA: Diagnosis present

## 2014-05-29 DIAGNOSIS — I5022 Chronic systolic (congestive) heart failure: Secondary | ICD-10-CM | POA: Diagnosis not present

## 2014-05-29 DIAGNOSIS — I5021 Acute systolic (congestive) heart failure: Secondary | ICD-10-CM | POA: Diagnosis not present

## 2014-05-29 DIAGNOSIS — E43 Unspecified severe protein-calorie malnutrition: Secondary | ICD-10-CM | POA: Diagnosis present

## 2014-05-29 DIAGNOSIS — Z823 Family history of stroke: Secondary | ICD-10-CM | POA: Diagnosis not present

## 2014-05-29 DIAGNOSIS — A419 Sepsis, unspecified organism: Secondary | ICD-10-CM | POA: Insufficient documentation

## 2014-05-29 DIAGNOSIS — F419 Anxiety disorder, unspecified: Secondary | ICD-10-CM | POA: Diagnosis present

## 2014-05-29 DIAGNOSIS — E1065 Type 1 diabetes mellitus with hyperglycemia: Secondary | ICD-10-CM | POA: Diagnosis present

## 2014-05-29 DIAGNOSIS — E1121 Type 2 diabetes mellitus with diabetic nephropathy: Secondary | ICD-10-CM | POA: Diagnosis not present

## 2014-05-29 DIAGNOSIS — Z940 Kidney transplant status: Secondary | ICD-10-CM | POA: Diagnosis not present

## 2014-05-29 DIAGNOSIS — T368X5A Adverse effect of other systemic antibiotics, initial encounter: Secondary | ICD-10-CM | POA: Diagnosis present

## 2014-05-29 DIAGNOSIS — E861 Hypovolemia: Secondary | ICD-10-CM | POA: Diagnosis not present

## 2014-05-29 DIAGNOSIS — J810 Acute pulmonary edema: Secondary | ICD-10-CM | POA: Diagnosis present

## 2014-05-29 DIAGNOSIS — Z4659 Encounter for fitting and adjustment of other gastrointestinal appliance and device: Secondary | ICD-10-CM

## 2014-05-29 DIAGNOSIS — D899 Disorder involving the immune mechanism, unspecified: Secondary | ICD-10-CM | POA: Diagnosis not present

## 2014-05-29 DIAGNOSIS — J189 Pneumonia, unspecified organism: Secondary | ICD-10-CM | POA: Diagnosis present

## 2014-05-29 DIAGNOSIS — Z7982 Long term (current) use of aspirin: Secondary | ICD-10-CM | POA: Diagnosis not present

## 2014-05-29 DIAGNOSIS — I252 Old myocardial infarction: Secondary | ICD-10-CM

## 2014-05-29 DIAGNOSIS — Z841 Family history of disorders of kidney and ureter: Secondary | ICD-10-CM | POA: Diagnosis not present

## 2014-05-29 DIAGNOSIS — Z01818 Encounter for other preprocedural examination: Secondary | ICD-10-CM

## 2014-05-29 DIAGNOSIS — I214 Non-ST elevation (NSTEMI) myocardial infarction: Secondary | ICD-10-CM | POA: Diagnosis present

## 2014-05-29 DIAGNOSIS — R778 Other specified abnormalities of plasma proteins: Secondary | ICD-10-CM | POA: Diagnosis present

## 2014-05-29 DIAGNOSIS — I509 Heart failure, unspecified: Secondary | ICD-10-CM

## 2014-05-29 DIAGNOSIS — I10 Essential (primary) hypertension: Secondary | ICD-10-CM | POA: Diagnosis present

## 2014-05-29 DIAGNOSIS — I5023 Acute on chronic systolic (congestive) heart failure: Secondary | ICD-10-CM | POA: Diagnosis present

## 2014-05-29 DIAGNOSIS — D849 Immunodeficiency, unspecified: Secondary | ICD-10-CM | POA: Diagnosis present

## 2014-05-29 DIAGNOSIS — F329 Major depressive disorder, single episode, unspecified: Secondary | ICD-10-CM | POA: Diagnosis present

## 2014-05-29 DIAGNOSIS — R0902 Hypoxemia: Secondary | ICD-10-CM | POA: Diagnosis present

## 2014-05-29 DIAGNOSIS — Z955 Presence of coronary angioplasty implant and graft: Secondary | ICD-10-CM

## 2014-05-29 DIAGNOSIS — Z9289 Personal history of other medical treatment: Secondary | ICD-10-CM

## 2014-05-29 LAB — BASIC METABOLIC PANEL
Anion gap: 10 (ref 5–15)
BUN: 27 mg/dL — ABNORMAL HIGH (ref 6–23)
CALCIUM: 8.1 mg/dL — AB (ref 8.4–10.5)
CO2: 20 mmol/L (ref 19–32)
CREATININE: 1.02 mg/dL (ref 0.50–1.35)
Chloride: 109 mmol/L (ref 96–112)
GFR, EST NON AFRICAN AMERICAN: 78 mL/min — AB (ref >90)
Glucose, Bld: 211 mg/dL — ABNORMAL HIGH (ref 70–99)
Potassium: 4.2 mmol/L (ref 3.5–5.1)
Sodium: 139 mmol/L (ref 135–145)

## 2014-05-29 LAB — CBC WITH DIFFERENTIAL/PLATELET
BASOS ABS: 0 10*3/uL (ref 0.0–0.1)
Basophils Relative: 0 % (ref 0–1)
EOS ABS: 0 10*3/uL (ref 0.0–0.7)
Eosinophils Relative: 0 % (ref 0–5)
HCT: 35.5 % — ABNORMAL LOW (ref 39.0–52.0)
Hemoglobin: 11.6 g/dL — ABNORMAL LOW (ref 13.0–17.0)
LYMPHS PCT: 7 % — AB (ref 12–46)
Lymphs Abs: 0.9 10*3/uL (ref 0.7–4.0)
MCH: 28.4 pg (ref 26.0–34.0)
MCHC: 32.7 g/dL (ref 30.0–36.0)
MCV: 87 fL (ref 78.0–100.0)
MONO ABS: 1 10*3/uL (ref 0.1–1.0)
MONOS PCT: 8 % (ref 3–12)
NEUTROS PCT: 85 % — AB (ref 43–77)
Neutro Abs: 10.7 10*3/uL — ABNORMAL HIGH (ref 1.7–7.7)
Platelets: 297 10*3/uL (ref 150–400)
RBC: 4.08 MIL/uL — ABNORMAL LOW (ref 4.22–5.81)
RDW: 14.2 % (ref 11.5–15.5)
WBC: 12.5 10*3/uL — ABNORMAL HIGH (ref 4.0–10.5)

## 2014-05-29 LAB — I-STAT ARTERIAL BLOOD GAS, ED
Acid-base deficit: 5 mmol/L — ABNORMAL HIGH (ref 0.0–2.0)
Bicarbonate: 19.2 mEq/L — ABNORMAL LOW (ref 20.0–24.0)
O2 Saturation: 92 %
PO2 ART: 66 mmHg — AB (ref 80.0–100.0)
Patient temperature: 98.6
TCO2: 20 mmol/L (ref 0–100)
pCO2 arterial: 33.8 mmHg — ABNORMAL LOW (ref 35.0–45.0)
pH, Arterial: 7.363 (ref 7.350–7.450)

## 2014-05-29 LAB — I-STAT CG4 LACTIC ACID, ED
LACTIC ACID, VENOUS: 1.14 mmol/L (ref 0.5–2.0)
Lactic Acid, Venous: 1.31 mmol/L (ref 0.5–2.0)

## 2014-05-29 LAB — CBG MONITORING, ED
Glucose-Capillary: 229 mg/dL — ABNORMAL HIGH (ref 70–99)
Glucose-Capillary: 197 mg/dL — ABNORMAL HIGH (ref 70–99)

## 2014-05-29 LAB — I-STAT TROPONIN, ED
TROPONIN I, POC: 0.85 ng/mL — AB (ref 0.00–0.08)
Troponin i, poc: 0.92 ng/mL (ref 0.00–0.08)

## 2014-05-29 LAB — BRAIN NATRIURETIC PEPTIDE: B Natriuretic Peptide: 1279.8 pg/mL — ABNORMAL HIGH (ref 0.0–100.0)

## 2014-05-29 IMAGING — CR DG CHEST 1V PORT
1 series · 1 of 1 positions shown · non-contrast
Comparison: [DATE]

CLINICAL DATA: Shortness of breath for a few days. Hypertension and
diabetes

EXAM:
PORTABLE CHEST - 1 VIEW

[AP]
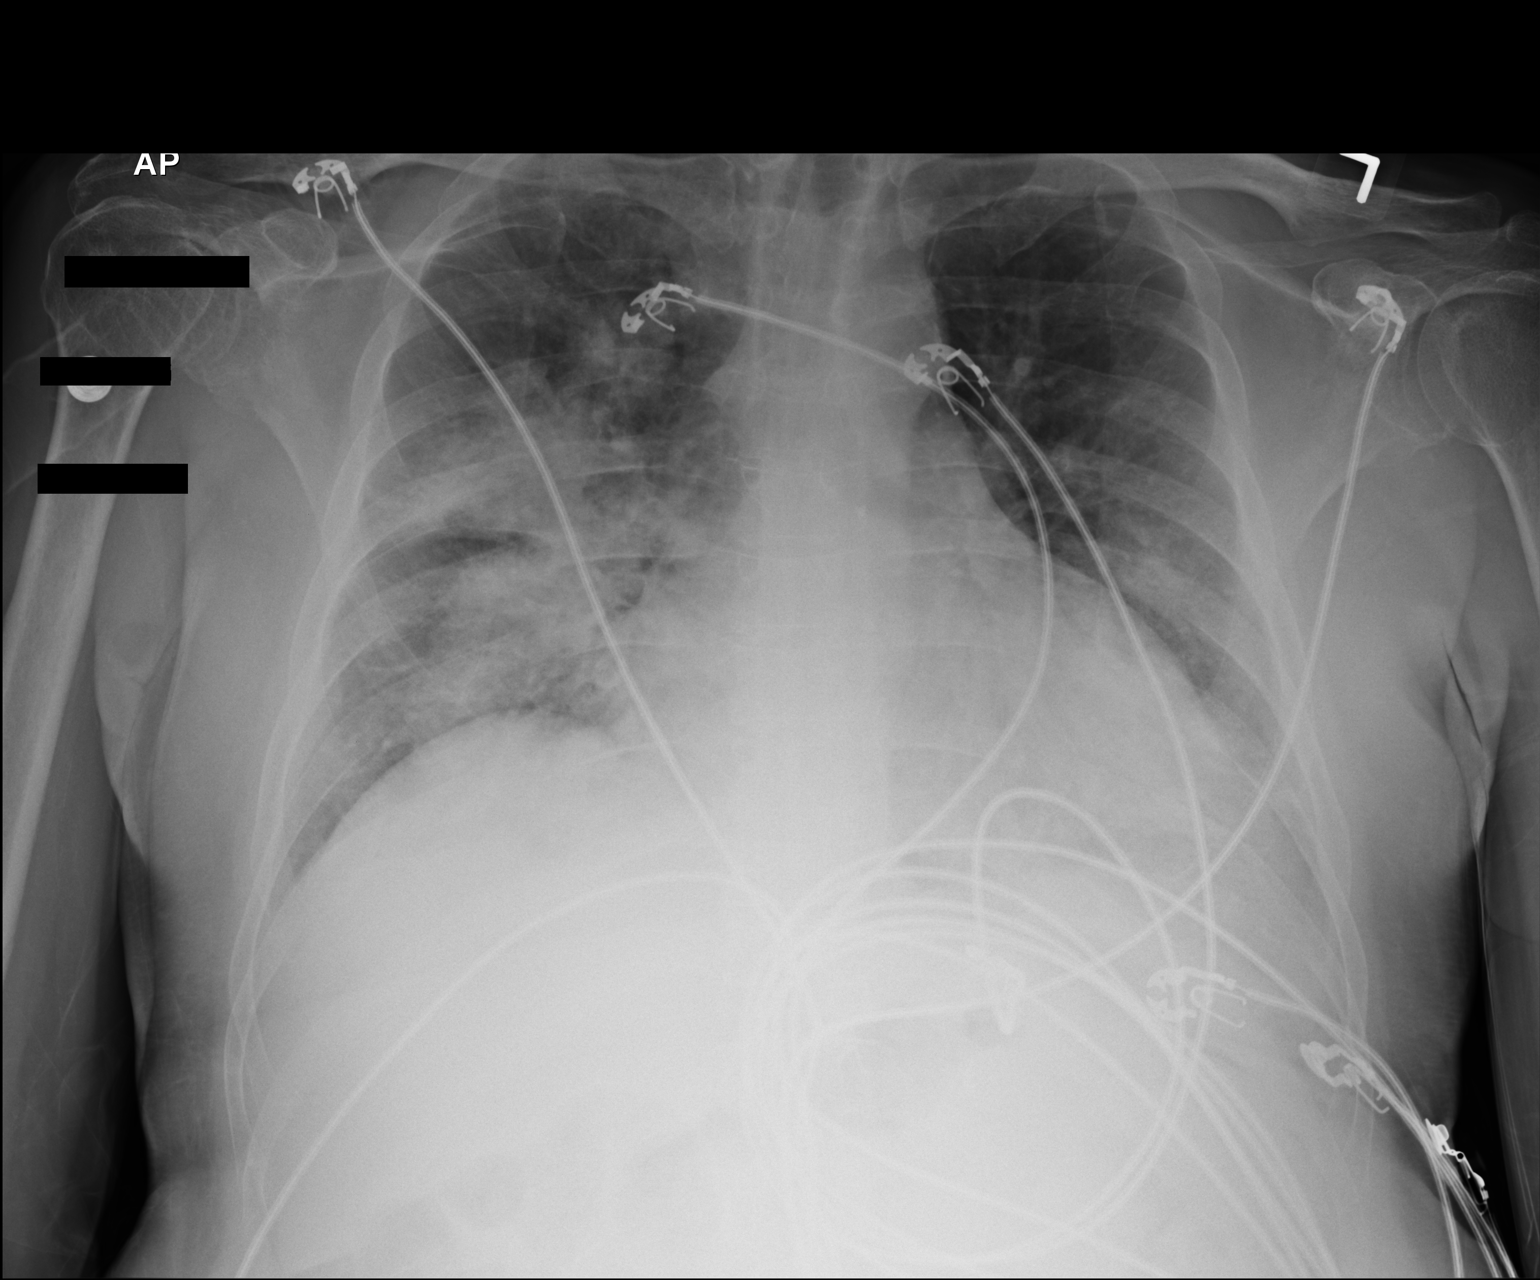

[1 of 1 positions shown; findings below may reference images not displayed]

FINDINGS: Dense patchy bilateral airspace disease, asymmetric to the right.
There is mild cardiomegaly without interstitial edema or pleural
effusion. Negative aortic and hilar contours.
IMPRESSION: Bilateral airspace disease consistent with pneumonia.

## 2014-05-29 MED ORDER — PREDNISONE 5 MG PO TABS
5.0000 mg | ORAL_TABLET | Freq: Every day | ORAL | Status: DC
  Filled 2014-05-29: qty 1

## 2014-05-29 MED ORDER — INSULIN ASPART 100 UNIT/ML ~~LOC~~ SOLN
0.0000 [IU] | Freq: Three times a day (TID) | SUBCUTANEOUS | Status: DC
  Administered 2014-05-31 (×2): 8 [IU] via SUBCUTANEOUS

## 2014-05-29 MED ORDER — MYCOPHENOLATE SODIUM 180 MG PO TBEC
180.0000 mg | DELAYED_RELEASE_TABLET | Freq: Every day | ORAL | Status: DC
Start: 2014-05-30 — End: 2014-06-08
  Administered 2014-05-31 – 2014-06-08 (×8): 180 mg via ORAL
  Filled 2014-05-29 (×10): qty 1

## 2014-05-29 MED ORDER — FUROSEMIDE 10 MG/ML IJ SOLN
40.0000 mg | Freq: Once | INTRAMUSCULAR | Status: AC
  Administered 2014-05-29: 40 mg via INTRAVENOUS
  Filled 2014-05-29: qty 4

## 2014-05-29 MED ORDER — FUROSEMIDE 10 MG/ML IJ SOLN
40.0000 mg | Freq: Once | INTRAMUSCULAR | Status: AC
  Administered 2014-05-29: 40 mg via INTRAVENOUS

## 2014-05-29 MED ORDER — ACETAMINOPHEN 325 MG PO TABS: 650.0000 mg | ORAL_TABLET | ORAL | Status: DC | PRN

## 2014-05-29 MED ORDER — ATORVASTATIN CALCIUM 40 MG PO TABS
40.0000 mg | ORAL_TABLET | Freq: Every day | ORAL | Status: DC
  Filled 2014-05-29: qty 1

## 2014-05-29 MED ORDER — HEPARIN BOLUS VIA INFUSION
4000.0000 [IU] | Freq: Once | INTRAVENOUS | Status: AC
  Administered 2014-05-29: 4000 [IU] via INTRAVENOUS
  Filled 2014-05-29: qty 4000

## 2014-05-29 MED ORDER — CLOPIDOGREL BISULFATE 75 MG PO TABS
75.0000 mg | ORAL_TABLET | Freq: Every day | ORAL | Status: DC
  Administered 2014-05-29: 75 mg via ORAL
  Filled 2014-05-29 (×2): qty 1

## 2014-05-29 MED ORDER — ASPIRIN 81 MG PO TABS: 81.0000 mg | ORAL_TABLET | Freq: Every day | ORAL | Status: DC

## 2014-05-29 MED ORDER — LORAZEPAM 2 MG/ML IJ SOLN
1.0000 mg | Freq: Once | INTRAMUSCULAR | Status: AC
  Administered 2014-05-29: 1 mg via INTRAVENOUS
  Filled 2014-05-29: qty 1

## 2014-05-29 MED ORDER — ASPIRIN 81 MG PO CHEW
324.0000 mg | CHEWABLE_TABLET | Freq: Once | ORAL | Status: AC
  Administered 2014-05-29: 324 mg via ORAL
  Filled 2014-05-29: qty 4

## 2014-05-29 MED ORDER — ASPIRIN 81 MG PO CHEW
81.0000 mg | CHEWABLE_TABLET | Freq: Every day | ORAL | Status: DC
  Administered 2014-05-31 – 2014-06-03 (×4): 81 mg via ORAL
  Filled 2014-05-29 (×5): qty 1

## 2014-05-29 MED ORDER — ALBUTEROL SULFATE (2.5 MG/3ML) 0.083% IN NEBU
5.0000 mg | INHALATION_SOLUTION | Freq: Once | RESPIRATORY_TRACT | Status: AC
  Administered 2014-05-29: 5 mg via RESPIRATORY_TRACT
  Filled 2014-05-29: qty 6

## 2014-05-29 MED ORDER — VANCOMYCIN HCL 10 G IV SOLR
1250.0000 mg | Freq: Once | INTRAVENOUS | Status: AC
  Administered 2014-05-29: 1250 mg via INTRAVENOUS
  Filled 2014-05-29: qty 1250

## 2014-05-29 MED ORDER — ONDANSETRON HCL 4 MG/2ML IJ SOLN
4.0000 mg | Freq: Four times a day (QID) | INTRAMUSCULAR | Status: DC | PRN
  Administered 2014-05-29 – 2014-05-30 (×2): 4 mg via INTRAVENOUS
  Filled 2014-05-29: qty 2

## 2014-05-29 MED ORDER — VANCOMYCIN HCL 10 G IV SOLR: 1500.0000 mg | Freq: Once | INTRAVENOUS | Status: DC

## 2014-05-29 MED ORDER — FUROSEMIDE 10 MG/ML IJ SOLN
40.0000 mg | Freq: Two times a day (BID) | INTRAMUSCULAR | Status: DC
  Administered 2014-05-29 – 2014-05-30 (×2): 40 mg via INTRAVENOUS
  Filled 2014-05-29: qty 4

## 2014-05-29 MED ORDER — IPRATROPIUM BROMIDE 0.02 % IN SOLN
0.5000 mg | Freq: Once | RESPIRATORY_TRACT | Status: AC
  Administered 2014-05-29: 0.5 mg via RESPIRATORY_TRACT
  Filled 2014-05-29: qty 2.5

## 2014-05-29 MED ORDER — ROSUVASTATIN CALCIUM 10 MG PO TABS: 10.0000 mg | ORAL_TABLET | ORAL | Status: DC

## 2014-05-29 MED ORDER — INSULIN DETEMIR 100 UNIT/ML ~~LOC~~ SOLN
30.0000 [IU] | Freq: Every day | SUBCUTANEOUS | Status: DC
Start: 2014-05-30 — End: 2014-05-30
  Administered 2014-05-30: 30 [IU] via SUBCUTANEOUS
  Filled 2014-05-29: qty 0.3

## 2014-05-29 MED ORDER — FUROSEMIDE 10 MG/ML IJ SOLN
40.0000 mg | Freq: Every day | INTRAMUSCULAR | Status: DC
  Administered 2014-05-29: 40 mg via INTRAVENOUS
  Filled 2014-05-29: qty 4

## 2014-05-29 MED ORDER — HEPARIN (PORCINE) IN NACL 100-0.45 UNIT/ML-% IJ SOLN
750.0000 [IU]/h | INTRAMUSCULAR | Status: DC
  Administered 2014-05-29: 850 [IU]/h via INTRAVENOUS
  Administered 2014-05-31: 1300 [IU]/h via INTRAVENOUS
  Administered 2014-06-01 – 2014-06-03 (×3): 1200 [IU]/h via INTRAVENOUS
  Administered 2014-06-04: 750 [IU]/h via INTRAVENOUS
  Filled 2014-05-29 (×14): qty 250

## 2014-05-29 MED ORDER — DEXTROSE 5 % IV SOLN
2.0000 g | Freq: Two times a day (BID) | INTRAVENOUS | Status: DC
  Administered 2014-05-29 – 2014-06-05 (×13): 2 g via INTRAVENOUS
  Filled 2014-05-29 (×16): qty 2

## 2014-05-29 MED ORDER — DEXTROSE 5 % IV SOLN
2.0000 g | Freq: Once | INTRAVENOUS | Status: AC
  Administered 2014-05-29: 2 g via INTRAVENOUS
  Filled 2014-05-29: qty 2

## 2014-05-29 MED ORDER — NITROGLYCERIN 0.4 MG SL SUBL: 0.4000 mg | SUBLINGUAL_TABLET | SUBLINGUAL | Status: DC | PRN

## 2014-05-29 MED ORDER — CYCLOSPORINE 100 MG PO CAPS
100.0000 mg | ORAL_CAPSULE | Freq: Two times a day (BID) | ORAL | Status: DC
  Administered 2014-05-29 – 2014-06-08 (×18): 100 mg via ORAL
  Filled 2014-05-29 (×23): qty 1

## 2014-05-29 MED ORDER — FLUCONAZOLE 150 MG PO TABS
150.0000 mg | ORAL_TABLET | Freq: Every day | ORAL | Status: DC
  Administered 2014-05-31 – 2014-06-08 (×8): 150 mg via ORAL
  Filled 2014-05-29 (×10): qty 1

## 2014-05-29 MED ORDER — CARVEDILOL 3.125 MG PO TABS
3.1250 mg | ORAL_TABLET | Freq: Two times a day (BID) | ORAL | Status: DC
  Filled 2014-05-29 (×3): qty 1

## 2014-05-29 NOTE — ED Notes (Signed)
Pt here from MD office with low sats of 74 % on room air 88 % on 2 liters , pt was placed on cpap by ems , pt arrived on cpap

## 2014-05-29 NOTE — ED Provider Notes (Signed)
CSN: [id number]     Arrival date & time 05/29/14  1405 History   First MD Initiated Contact with Patient 05/29/14 1411     Chief Complaint  Patient presents with  . Shortness of Breath   (Consider location/radiation/quality/duration/timing/severity/associated sxs/prior Treatment)  Patient is a 59 y.o. male presenting with shortness of breath. The history is provided by the patient, a relative and the EMS personnel. No language interpreter was used.  Shortness of Breath Severity:  Moderate Onset quality:  Gradual Timing:  Constant Progression:  Worsening Chronicity:  New Context: URI   Relieved by:  Nothing Worsened by:  Nothing tried Ineffective treatments:  None tried Associated symptoms: cough, fever and sputum production   Associated symptoms: no abdominal pain, no chest pain, no diaphoresis, no headaches and no vomiting     Past Medical History  Diagnosis Date  . DIABETES MELLITUS, TYPE I 01/10/2007  . HYPERLIPIDEMIA 01/10/2007  . HYPERKALEMIA 04/09/2009  . ANEMIA 02/15/2007  . ANXIETY 05/23/2007  . DEPRESSION 01/10/2007  . PERIPHERAL NEUROPATHY 01/10/2007  . HYPERTENSION 01/10/2007  . RENAL INSUFFICIENCY, CHRONIC 06/07/2008  . 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 Palpitations 03/19/2008  . Proteinuria 06/07/2008   No past surgical history on file. Family History  Problem Relation Age of Onset  . Cancer Neg Hx    History  Substance Use Topics  . Smoking status: Never Smoker   . Smokeless tobacco: Not on file  . Alcohol Use: Not on file    Review of Systems  Constitutional: Positive for fever. Negative for diaphoresis and fatigue.  Respiratory: Positive for cough, sputum production and shortness of breath. Negative for chest tightness.   Cardiovascular: Positive for leg swelling. Negative for chest pain.  Gastrointestinal: Negative for nausea, vomiting, abdominal pain, diarrhea and constipation.  Skin: Negative for wound.  Neurological: Negative for speech  difficulty, light-headedness and headaches.  Psychiatric/Behavioral: Negative for confusion.  All other systems reviewed and are negative.    Allergies  Pioglitazone  Home Medications   Prior to Admission medications   Medication Sig Start Date End Date Taking? Authorizing Provider  aspirin 81 MG tablet Take 81 mg by mouth daily.      Historical Provider, MD  BD ULTRA-FINE PEN NEEDLES 29G X 12.7MM MISC USE AS DIRECTED 02/28/11   Romero BellingSean Ellison, MD  cycloSPORINE (SANDIMMUNE) 100 MG capsule 100 mg each morning, and 50 mg each evening    Historical Provider, MD  fluconazole (DIFLUCAN) 150 MG tablet Take 150 mg by mouth daily.    Historical Provider, MD  insulin detemir (LEVEMIR FLEXPEN) 100 UNIT/ML injection 25 Units 2 (two) times daily.  06/09/10   Romero BellingSean Ellison, MD  mycophenolate (MYFORTIC) 180 MG EC tablet Take by mouth daily.    Historical Provider, MD  predniSONE (DELTASONE) 5 MG tablet Take 5 mg by mouth daily.    Historical Provider, MD  rosuvastatin (CRESTOR) 10 MG tablet Take 10 mg by mouth daily.    Historical Provider, MD   ED Triage Vitals  Enc Vitals Group     BP 05/29/14 1522 144/72 mmHg     Pulse Rate 05/29/14 1522 95     Resp 05/29/14 1411 31     Temp --      Temp src --      SpO2 05/29/14 1411 100 %     Weight --      Height --      Head Cir --      Peak Flow --  Pain Score --      Pain Loc --      Pain Edu? --      Excl. in GC? --    Physical Exam  Constitutional: He is oriented to person, place, and time. He appears well-developed and well-nourished. No distress.  HENT:  Head: Normocephalic and atraumatic.  Nose: Nose normal.  Mouth/Throat: Oropharynx is clear and moist. No oropharyngeal exudate.  Eyes: EOM are normal. Pupils are equal, round, and reactive to light.  Neck: Normal range of motion. Neck supple.  Cardiovascular: Normal rate, regular rhythm, normal heart sounds and intact distal pulses.   No murmur heard. Pulmonary/Chest: Effort normal.  Tachypnea noted. No respiratory distress. He has no wheezes. He has rhonchi. He has rales. He exhibits no tenderness.  Speaking in full sentences, not gasping for air.  O2 by CPAP facemask  Abdominal: Soft. He exhibits no distension. There is no tenderness. There is no guarding.  Musculoskeletal: Normal range of motion. He exhibits no tenderness.  No chest tenderness to palpation, stable, and no crepitus.  No pelvis tenderness to palpation and stable.  Moving all 4 extremities, with no gross deformities, but tender at [facility] diffusely.  No C/T/L spine midline tenderness, no step offs.     Neurological: He is alert and oriented to person, place, and time. No cranial nerve deficit. Coordination normal.  Skin: Skin is warm and dry. He is not diaphoretic. No pallor.  Psychiatric: He has a normal mood and affect. His behavior is normal. Judgment and thought content normal.  Nursing note and vitals reviewed.   ED Course  Procedures (including critical care time) Labs Review Labs Reviewed  CBC WITH DIFFERENTIAL/PLATELET - Abnormal; Notable for the following:    WBC 12.5 (*)    RBC 4.08 (*)    Hemoglobin 11.6 (*)    HCT 35.5 (*)    Neutrophils Relative % 85 (*)    Neutro Abs 10.7 (*)    Lymphocytes Relative 7 (*)    All other components within normal limits  BASIC METABOLIC PANEL - Abnormal; Notable for the following:    Glucose, Bld 211 (*)    BUN 27 (*)    Calcium 8.1 (*)    GFR calc non Af Amer 78 (*)    All other components within normal limits  BRAIN NATRIURETIC PEPTIDE - Abnormal; Notable for the following:    B Natriuretic Peptide 1279.8 (*)    All other components within normal limits  I-STAT TROPOININ, ED - Abnormal; Notable for the following:    Troponin i, poc 0.92 (*)    All other components within normal limits  CBG MONITORING, ED - Abnormal; Notable for the following:    Glucose-Capillary 229 (*)    All other components within normal limits  I-STAT ARTERIAL  BLOOD GAS, ED - Abnormal; Notable for the following:    pCO2 arterial 33.8 (*)    pO2, Arterial 66.0 (*)    Bicarbonate 19.2 (*)    Acid-base deficit 5.0 (*)    All other components within normal limits  CULTURE, BLOOD (ROUTINE X 2)  CULTURE, BLOOD (ROUTINE X 2)  BLOOD GAS, ARTERIAL  I-STAT CG4 LACTIC ACID, ED  I-STAT CG4 LACTIC ACID, ED  Rosezena Sensor, ED    Imaging Review Dg Chest Portable 1 View  05/29/2014   CLINICAL DATA:  Shortness of breath for a few days. Hypertension and diabetes  EXAM: PORTABLE CHEST - 1 VIEW  COMPARISON:  01/09/1999  FINDINGS:  Dense patchy bilateral airspace disease, asymmetric to the right. There is mild cardiomegaly without interstitial edema or pleural effusion. Negative aortic and hilar contours.  IMPRESSION: Bilateral airspace disease consistent with pneumonia.   Electronically Signed   By: Marnee Spring M.D.   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  Has had a cough x 1 week with productive clear sputum then progressive SOB today.  Had 75% on room air at urgent care so was sent to the ED.  Given CPAP en route.  Speaking in full sentences but with wet sounding lungs and O2 dropped to 85% on room air when changing masks on ED arrival.   Wet sounding, 1+ bilateral pitting edema on lower extremities.   No reproducible chest pain.   Kept on BiPAP  initially, then transitioned to O2 by nonrebreather.  Maintaining upper 90% O2 on nonrebreather at 6L.  Given albuterol and atrovent.    EKG: sinus with RBBB, similar to previous.   Trop 0.9 O2 sats maintained on 6O2 by [place] now.  ABG at 1530 while on 6L O2: pH 7.36/pCO2 33/ pO2 66/ HCO3 19  CXR shows bilateral opacities concerning for pneumonia.  Covered with vanc and cefepime due to history of immunosuppression.  Has not been hospitalized recently so will be CAP.  BNP elevated at 1279.  Will need admission for hypoxia secondary to mixed picture of CHF exacerbation and CAP.  Unclear primary etiology at this time.  No documented hx of CHF in the past.  Does not take diuretics at home.    Patient is seen by [facility] clinic for his endocrine problems, and uses Dr. Everardo All as a PCP essentially.  Hospitalist called for admission.  Will admit to hospitalist team, Dr. Montez Morita.  Initially placed on telemetry bed, but will upgrade to stepdown unit per admitting team request as he is back on the nonrebreather again.  They will give him lasix.  Repeat trop pending.    Patient was seen with ED Attending, Dr. Prudencio Burly, MD         Lenell Antu, MD 05/30/14 9811  Tilden Fossa, MD 05/31/14 1057

## 2014-05-29 NOTE — H&P (Signed)
[facility] History and Physical  Steven Riddle Graven NWG:[id number] DOB: 1955-04-22 DOA: 05/29/2014  PCP: Romero BellingELLISON, SEAN, MD  Specialists: Dr. Sharyn LullHarwani, Cardiology  Chief Complaint: Shortness of breath  HPI: Steven Riddle Quale is a 59 y.o. gentleman who was [facility] ED [facility] for evaluation of hypoxia.  The patient has multiple cardiac risk factors including HTN, DM, and hyperlipidemia, but he denies any known history of heart disease.  He has a history of renal transplant in 2013 in [place] for ESRD secondary to DM.  He feels that he was in his baseline state of health until approximately one week ago.  At that time, he developed a cough productive of white sputum.  No associated fever, chills, or sweats.  No known sick contacts and no recent travel.  Within the past 2-3 days, he has developed progressive bilateral ankle swelling and orthopnea.  He denies chest pain, pressure, or tightness.  No  Light-headedness or dizziness.  No syncope.  No known history of CHF.  ED evaluation concerning for abnormal chest xray (bilateral infiltrates) and a mild leukocytosis, and the patient has already received broad spectrum IV antibiotics (cefepime and vancomycin).  He is immunosuppressed based on his history of renal transplant and current medications including prednisone, cyclosporine, and mycophenolate.  His lactic acid level is normal.  However, he also has a positive troponin, elevated BNP, and clinical findings suggestive of volume overload that raise even higher suspicion for a cardiac etiology of his hypoxia.  He is being admitted to the stepdown unit.  Review of Systems: 10 systems reviewed and negative except as stated in HPI.  Past Medical History  Diagnosis Date  . DIABETES MELLITUS, TYPE I 01/10/2007  . HYPERLIPIDEMIA 01/10/2007  . HYPERKALEMIA 04/09/2009  . ANEMIA 02/15/2007  . ANXIETY 05/23/2007  . DEPRESSION 01/10/2007  . PERIPHERAL NEUROPATHY 01/10/2007  . HYPERTENSION 01/10/2007  .  RENAL INSUFFICIENCY, CHRONIC 06/07/2008  . 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 Palpitations 03/19/2008  . Proteinuria 06/07/2008   PAST SURGICAL HISTORY: Renal transplant in 2013 in [place]  Social History:  History   Social History Narrative   Owns Hotels  No tobacco, EtOH, or illicit drug use.  He is married.  He has adult children.  Allergies  Allergen Reactions  . Pioglitazone     Family History  Problem Relation Age of Onset  . Cancer Neg Hx   His father had heart disease with a history of CABG and ?CHF.  He is now deceased.  One brother also has ESRD secondary to DM.  He is currently on HD.  He has also had a stroke.  One sister is also a diabetic.  Prior to Admission medications   Medication Sig Start Date End Date Taking? Authorizing Provider  aspirin 81 MG tablet Take 81 mg by mouth daily.      Historical Provider, MD  BD ULTRA-FINE PEN NEEDLES 29G X 12.7MM MISC USE AS DIRECTED 02/28/11   Romero BellingSean Ellison, MD  cycloSPORINE (SANDIMMUNE) 100 MG capsule 100 mg 2 (two) times daily.    Historical Provider, MD  fluconazole (DIFLUCAN) 150 MG tablet Take 150 mg by mouth daily.    Historical Provider, MD  insulin detemir (LEVEMIR FLEXPEN) 100 UNIT/ML injection 30 Units daily.  06/09/10   Romero BellingSean Ellison, MD  mycophenolate (MYFORTIC) 180 MG EC tablet Take by mouth daily.    Historical Provider, MD  predniSONE (DELTASONE) 5 MG tablet Take 5 mg by mouth daily.    Historical Provider, MD  rosuvastatin (  CRESTOR) 10 MG tablet Take 10 mg by mouth every other day.    Yes Historical Provider, MD   Physical Exam: Filed Vitals:   05/29/14 1815 05/29/14 1830 05/29/14 1845 05/29/14 1915  BP: 125/75 134/71 133/57 135/66  Pulse: 98 99 99 101  Temp:      TempSrc:      Resp: SpO2: 96% 97% 95% 97%     General:  Awake and alert.  Wearing 100% NRB at time of exam.  O2 sats drop with exertion in bed.  Oriented to person, place, time and situation.  NAD when he is not exerting himself.   Becomes tachycardic and tachypneic, even with exertion in bed.  Eyes: PERRL bilaterally, EOMI.  Neck: Supple.   Cardiovascular: NR/RR.  1-2+ pitting edema in bilateral lower extremities.  Respiratory: Bilateral rales and ronchi.  Abdomen: Soft/NT/ND.  Bowel sounds are present.  No guarding.  Skin: Warm and dry.  Musculoskeletal: Moves all four extremities spontaneously.  Psychiatric: Normal affect.  Neurologic: No focal deficits.   Labs on Admission:  Basic Metabolic Panel:  Recent Labs Lab 05/29/14 1436  NA 139  K 4.2  CL 109  CO2 20  GLUCOSE 211*  BUN 27*  CREATININE 1.02  CALCIUM 8.1*   CBC:  Recent Labs Lab 05/29/14 1436  WBC 12.5*  NEUTROABS 10.7*  HGB 11.6*  HCT 35.5*  MCV 87.0  PLT 297    BNP (last 3 results)  Recent Labs  05/29/14 1436  BNP 1279.8*   CBG:  Recent Labs Lab 05/29/14 1422  GLUCAP 229*    Radiological Exams on Admission: Dg Chest Portable 1 View  05/29/2014   CLINICAL DATA:  Shortness of breath for a few days. Hypertension and diabetes  EXAM: PORTABLE CHEST - 1 VIEW  COMPARISON:  01/09/1999  FINDINGS: Dense patchy bilateral airspace disease, asymmetric to the right. There is mild cardiomegaly without interstitial edema or pleural effusion. Negative aortic and hilar contours.  IMPRESSION: Bilateral airspace disease consistent with pneumonia.   Electronically Signed   By: Marnee Spring M.D.   On: 05/29/2014 14:41    EKG: Independently reviewed. RBBB, LAFB previously identified.  NSR.  Assessment/Plan Principal Problem:   CHF (congestive heart failure) Active Problems:   DM type 2 causing renal disease   CAP (community acquired pneumonia)   Hypoxia   NSTEMI (non-ST elevated myocardial infarction)   1.  Acute CHF secondary to NSTEMI causing hypoxia --Admit to stepdown unit --His cardiologist, Dr. Sharyn Lull has been consulted [phone] --Aspirin  po one time now then continue aspirin  daily --Heparin  drip per ACS protocol now per my discussion with Dr. Sharyn Lull --Serial troponin --IV lasix now then continue q12h, which will hopefully help facilitate weaning of oxygen.  Patient was intolerant of BiPAP mask upon arrival to ED --Hold on ACE-I for now given history of renal disease.  Hold on Beta Blocker for now until volume status and respiratory status improved.  Defer to cardiology. --Patient already on STATIN. --2D echo in AM --EKG qAM x2 --Diet as tolerated with 1500cc fluid restriction --Strict I/O  2. Questionable CAP --Agree that broad spectrum antibiotic coverage is reasonable for now given his immunosuppressed stated.  3.  History of renal transplant --Continue cyclosporine, mycophenolate, and prednisone. --Monitor renal function daily  4.  DM --Levemir once daily (in AM) --Sliding scale insulin coverage AC/HS   Code Status: FULL Family Communication: Multiple family members including spouse and siblings at bedside Disposition  Plan: At least two midnights  Time spent: 75 minutes  [employer] [facility]  05/29/2014, 7:44 PM

## 2014-05-29 NOTE — Progress Notes (Signed)
ANTICOAGULATION CONSULT NOTE - Initial Consult  Pharmacy Consult for Heparin Indication: chest pain/ACS  Allergies  Allergen Reactions  . Pioglitazone     Patient Measurements:   Heparin Dosing Weight: 68 kg  Vital Signs: Temp: 99.4 F (37.4 C) (04/26 1639) Temp Source: Rectal (04/26 1639) BP: 135/66 mmHg (04/26 1915) Pulse Rate: 101 (04/26 1915)  Labs:  Recent Labs  05/29/14 1436  HGB 11.6*  HCT 35.5*  PLT 297  CREATININE 1.02    CrCl cannot be calculated (Unknown ideal weight.).   Medical History: Past Medical History  Diagnosis Date  . DIABETES MELLITUS, TYPE I 01/10/2007  . HYPERLIPIDEMIA 01/10/2007  . HYPERKALEMIA 04/09/2009  . ANEMIA 02/15/2007  . ANXIETY 05/23/2007  . DEPRESSION 01/10/2007  . PERIPHERAL NEUROPATHY 01/10/2007  . HYPERTENSION 01/10/2007  . RENAL INSUFFICIENCY, CHRONIC 06/07/2008  . 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 Palpitations 03/19/2008  . Proteinuria 06/07/2008    Medications:   (Not in a hospital admission) Scheduled:  . aspirin  324 mg Oral Once  . furosemide  40 mg Intravenous Once   Infusions:  . [START ON 05/30/2014] ceFEPime (MAXIPIME) IV      Assessment: 59yo male with HTN, DM and HLD presents with SOB. Pharmacy is consulted to dose heparin for ACS/chest pain. Hgb 11.6, Plt 297, sCr 1.02, BNP 1279.8, Trop 0.92.  Goal of Therapy:  Heparin level 0.3-0.7 units/ml Monitor platelets by anticoagulation protocol: Yes   Plan:  Give 4000 units bolus x 1 Start heparin infusion at 850 units/hr Check anti-Xa level in 6 hours and daily while on heparin Continue to monitor H&H and platelets   Arlean Hoppingorey M. Newman PiesBall, PharmD Clinical Pharmacist Pager [phone] 05/29/2014,7:52 PM

## 2014-05-29 NOTE — ED Notes (Signed)
Second Set of blood cultures collected prior to abx starting

## 2014-05-29 NOTE — ED Notes (Signed)
Attempted report 

## 2014-05-29 NOTE — Progress Notes (Signed)
Patient brought in by EMS on CPAP.  Was placed on Bipap.  Currently tolerating well.  Will obtain follow up ABG.  Will continue to monitor.

## 2014-05-29 NOTE — ED Notes (Signed)
Dr. Madilyn Hookees was notified of the patients 0.92 I-Stat Troponin.

## 2014-05-29 NOTE — Progress Notes (Addendum)
ANTIBIOTIC CONSULT NOTE - INITIAL  Pharmacy Consult for Cefepime Indication: rule out pneumonia and rule out sepsis  Allergies  Allergen Reactions  . Pioglitazone     Patient Measurements:   Adjusted Body Weight:   Vital Signs:   Intake/Output from previous day:   Intake/Output from this shift:    Labs: No results for input(s): WBC, HGB, PLT, LABCREA, CREATININE in the last 72 hours. CrCl cannot be calculated (Unknown ideal weight.). No results for input(s): VANCOTROUGH, VANCOPEAK, VANCORANDOM, GENTTROUGH, GENTPEAK, GENTRANDOM, TOBRATROUGH, TOBRAPEAK, TOBRARND, AMIKACINPEAK, AMIKACINTROU, AMIKACIN in the last 72 hours.   Microbiology: No results found for this or any previous visit (from the past 720 hour(s)).  Medical History: Past Medical History  Diagnosis Date  . DIABETES MELLITUS, TYPE I 01/10/2007  . HYPERLIPIDEMIA 01/10/2007  . HYPERKALEMIA 04/09/2009  . ANEMIA 02/15/2007  . ANXIETY 05/23/2007  . DEPRESSION 01/10/2007  . PERIPHERAL NEUROPATHY 01/10/2007  . HYPERTENSION 01/10/2007  . RENAL INSUFFICIENCY, CHRONIC 06/07/2008  . 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 Palpitations 03/19/2008  . Proteinuria 06/07/2008    Medications:   (Not in a hospital admission) Scheduled:  . albuterol  5 mg Nebulization Once  . ipratropium  0.5 mg Nebulization Once   Infusions:  . vancomycin     Assessment: 59yo male with history of DM2, HTN and renal insufficiency 2/2 to R kidney transplant presents with SOB. Pharmacy is consulted to dose cefepime for suspected sepsis and pneumonia. WBC 12.5, sCr 1.02, LA 1.14.  Pt received one dose of vancomycin in the ED.  Goal of Therapy:  Vancomycin trough level 15-20 mcg/ml  Plan:  Cefepime 2g IV q12h Vancomycin 1250mg  IV once Expected duration 7 days with resolution of temperature and/or normalization of WBC Measure antibiotic drug levels at steady state Follow up culture results, renal function, and clinical course  Arlean HoppingCorey M.  Newman PiesBall, PharmD Clinical Pharmacist Pager [phone] 05/29/2014,2:40 PM

## 2014-05-29 NOTE — ED Notes (Signed)
RT contacted to reassess pt

## 2014-05-29 NOTE — Progress Notes (Signed)
Report attempted x 2, will re attempt.

## 2014-05-29 NOTE — Consult Note (Signed)
Reason for Consult: CHF/elevated troponin I Referring Physician: [facility]  Steven Riddle is an 59 y.o. male.  HPI: Patient is 59 year old male with past medical history significant for multiple medical problems i.e. coronary artery disease history of possible old silent anteroseptal wall MI in the past, hypertension, insulin-requiring diabetes mellitus, end-stage renal disease status post renal transplant, peripheral neuropathy, anemia of chronic disease, depression, came to [facility] ER from urgent care as patient was noted to be hypoxic. Patient complains of progressive increasing shortness of breath for last few days associated with feeling weak tired and no energy. Also complains of cough with whitish  phlegm. Denies any fever or chills took  Mucinex without improvement so decided to go to urgent care. Patient also gives history of PND orthopnea leg swelling. Denies palpitation lightheadedness or syncope. Denies any recent cardiac workup in the past. In ED patient was noted to have elevated BNP and minimally elevated troponin I. EKG done in the ED showed sinus tach with old anteroseptal wall MI right bundle branch block and left fascicular block with secondary ST-T wave changes. No new acute ischemic changes were noted.  Past Medical History  Diagnosis Date  . DIABETES MELLITUS, TYPE I 01/10/2007  . HYPERLIPIDEMIA 01/10/2007  . HYPERKALEMIA 04/09/2009  . ANEMIA 02/15/2007  . ANXIETY 05/23/2007  . DEPRESSION 01/10/2007  . PERIPHERAL NEUROPATHY 01/10/2007  . HYPERTENSION 01/10/2007  . RENAL INSUFFICIENCY, CHRONIC 06/07/2008  . 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 Palpitations 03/19/2008  . Proteinuria 06/07/2008    Past Surgical History  Procedure Laterality Date  . Nephrectomy transplanted organ      Family History  Problem Relation Age of Onset  . Cancer Neg Hx     Social History:  reports that he has never smoked. He does not have any smokeless tobacco history on file. He  reports that he does not drink alcohol or use illicit drugs.  Allergies:  Allergies  Allergen Reactions  . Pioglitazone     Medications: I have reviewed the patient's current medications.  Results for orders placed or performed during the hospital encounter of 05/29/14 (from the past 48 hour(s))  POC CBG, ED     Status: Abnormal   Collection Time: 05/29/14  2:22 PM  Result Value Ref Range   Glucose-Capillary 229 (H) 70 - 99 mg/dL  CBC with Differential     Status: Abnormal   Collection Time: 05/29/14  2:36 PM  Result Value Ref Range   WBC 12.5 (H) 4.0 - 10.5 K/uL   RBC 4.08 (L) 4.22 - 5.81 MIL/uL   Hemoglobin 11.6 (L) 13.0 - 17.0 g/dL   HCT 35.5 (L) 39.0 - 52.0 %   MCV 87.0 78.0 - 100.0 fL   MCH 28.4 26.0 - 34.0 pg   MCHC 32.7 30.0 - 36.0 g/dL   RDW 14.2 11.5 - 15.5 %   Platelets 297 150 - 400 K/uL   Neutrophils Relative % 85 (H) 43 - 77 %   Neutro Abs 10.7 (H) 1.7 - 7.7 K/uL   Lymphocytes Relative 7 (L) 12 - 46 %   Lymphs Abs 0.9 0.7 - 4.0 K/uL   Monocytes Relative 8 3 - 12 %   Monocytes Absolute 1.0 0.1 - 1.0 K/uL   Eosinophils Relative 0 0 - 5 %   Eosinophils Absolute 0.0 0.0 - 0.7 K/uL   Basophils Relative 0 0 - 1 %   Basophils Absolute 0.0 0.0 - 0.1 K/uL  Basic metabolic panel     Status:  Abnormal   Collection Time: 05/29/14  2:36 PM  Result Value Ref Range   Sodium 139 135 - 145 mmol/L   Potassium 4.2 3.5 - 5.1 mmol/L   Chloride 109 96 - 112 mmol/L   CO2 20 19 - 32 mmol/L   Glucose, Bld 211 (H) 70 - 99 mg/dL   BUN 27 (H) 6 - 23 mg/dL   Creatinine, Ser 1.02 0.50 - 1.35 mg/dL   Calcium 8.1 (L) 8.4 - 10.5 mg/dL   GFR calc non Af Amer 78 (L) >90 mL/min   GFR calc Af Amer >90 >90 mL/min    Comment: (NOTE) The eGFR has been calculated using the CKD EPI equation. This calculation has not been validated in all clinical situations. eGFR's persistently <90 mL/min signify possible Chronic Kidney Disease.    Anion gap 10 5 - 15  Brain natriuretic peptide     Status:  Abnormal   Collection Time: 05/29/14  2:36 PM  Result Value Ref Range   B Natriuretic Peptide 1279.8 (H) 0.0 - 100.0 pg/mL  I-Stat Troponin, ED (not at [place])     Status: Abnormal   Collection Time: 05/29/14  2:40 PM  Result Value Ref Range   Troponin i, poc 0.92 (HH) 0.00 - 0.08 ng/mL   Comment NOTIFIED PHYSICIAN    Comment 3            Comment: Due to the release kinetics of cTnI, a negative result within the first hours of the onset of symptoms does not rule out myocardial infarction with certainty. If myocardial infarction is still suspected, repeat the test at appropriate intervals.   I-Stat CG4 Lactic Acid, ED     Status: None   Collection Time: 05/29/14  2:43 PM  Result Value Ref Range   Lactic Acid, Venous 1.14 0.5 - 2.0 mmol/L  I-Stat arterial blood gas, ED     Status: Abnormal   Collection Time: 05/29/14  3:32 PM  Result Value Ref Range   pH, Arterial 7.363 7.350 - 7.450   pCO2 arterial 33.8 (L) 35.0 - 45.0 mmHg   pO2, Arterial 66.0 (L) 80.0 - 100.0 mmHg   Bicarbonate 19.2 (L) 20.0 - 24.0 mEq/L   TCO2 20 0 - 100 mmol/L   O2 Saturation 92.0 %   Acid-base deficit 5.0 (H) 0.0 - 2.0 mmol/L   Patient temperature 98.6 F    Collection site RADIAL, ALLEN'S TEST ACCEPTABLE    Drawn by Operator    Sample type ARTERIAL   I-Stat CG4 Lactic Acid, ED     Status: None   Collection Time: 05/29/14  7:39 PM  Result Value Ref Range   Lactic Acid, Venous 1.31 0.5 - 2.0 mmol/L  I-Stat Troponin, ED (not at [facility])     Status: Abnormal   Collection Time: 05/29/14  7:46 PM  Result Value Ref Range   Troponin i, poc 0.85 (HH) 0.00 - 0.08 ng/mL   Comment NOTIFIED PHYSICIAN    Comment 3            Comment: Due to the release kinetics of cTnI, a negative result within the first hours of the onset of symptoms does not rule out myocardial infarction with certainty. If myocardial infarction is still suspected, repeat the test at appropriate intervals.     Dg Chest Portable 1  View  05/29/2014   CLINICAL DATA:  Shortness of breath for a few days. Hypertension and diabetes  EXAM: PORTABLE CHEST - 1 VIEW  COMPARISON:  01/09/1999  FINDINGS: Dense patchy bilateral airspace disease, asymmetric to the right. There is mild cardiomegaly without interstitial edema or pleural effusion. Negative aortic and hilar contours.  IMPRESSION: Bilateral airspace disease consistent with pneumonia.   Electronically Signed   By: Monte Fantasia M.D.   On: 05/29/2014 14:41    Review of Systems  Constitutional: Positive for malaise/fatigue.  Eyes: Negative for double vision.  Respiratory: Positive for cough, sputum production and shortness of breath. Negative for hemoptysis.   Cardiovascular: Negative for chest pain, palpitations and orthopnea.  Gastrointestinal: Negative for nausea, vomiting and abdominal pain.  Genitourinary: Negative for dysuria.  Neurological: Positive for weakness. Negative for dizziness and headaches.   Blood pressure 135/66, pulse 101, temperature 99.4 F (37.4 C), temperature source Rectal, resp. rate 30, SpO2 97 %. Physical Exam  Constitutional: He is oriented to person, place, and time.  HENT:  Head: Normocephalic and atraumatic.  Eyes: Conjunctivae are normal. Left eye exhibits no discharge. No scleral icterus.  Neck: Normal range of motion. Neck supple. JVD present. No tracheal deviation present. No thyromegaly present.  Cardiovascular:  Tachycardic S1 and S2 soft there is soft S3 gallop  Respiratory:  Decrease pressor at bases with bilateral lateral rhonchi and rales  GI: Soft. Bowel sounds are normal. He exhibits no distension. There is no tenderness. There is no rebound.  Musculoskeletal:  No clubbing cyanosis 2+ edema noted  Neurological: He is alert and oriented to person, place, and time.    Assessment/Plan: Possible small non-Q-wave myocardial infarction Coronary artery disease history of possible silent anteroseptal wall MI in the past Mild  decompensated systolic heart failure Possible bilateral pneumonia Hypertension Insulin requiring diabetes mellitus Peripheral neuropathy Anemia of chronic disease Depression Anxiety disorder End-stage renal disease status post renal transplant on chronic immunosuppressive therapy Plan Check serial cardiac enzymes and EKG Check 2-D echo Agree with IV heparin, Lasix . Start Plavix as per orders Will add statins, low-dose beta blockers and nitrates as blood pressure tolerates  Antibiotics per primary team Will schedule for cardiac catheterization and possible PCI once fully compensated   Harlie Ragle N 05/29/2014, 8:25 PM

## 2014-05-30 ENCOUNTER — Inpatient Hospital Stay (HOSPITAL_COMMUNITY): Payer: PPO

## 2014-05-30 ENCOUNTER — Other Ambulatory Visit (HOSPITAL_COMMUNITY): Payer: Self-pay

## 2014-05-30 DIAGNOSIS — I509 Heart failure, unspecified: Secondary | ICD-10-CM

## 2014-05-30 DIAGNOSIS — J189 Pneumonia, unspecified organism: Secondary | ICD-10-CM

## 2014-05-30 DIAGNOSIS — D899 Disorder involving the immune mechanism, unspecified: Secondary | ICD-10-CM

## 2014-05-30 DIAGNOSIS — J810 Acute pulmonary edema: Secondary | ICD-10-CM

## 2014-05-30 DIAGNOSIS — J9601 Acute respiratory failure with hypoxia: Secondary | ICD-10-CM

## 2014-05-30 LAB — TROPONIN I
TROPONIN I: 1.01 ng/mL — AB (ref <0.031)
TROPONIN I: 0.96 ng/mL — AB (ref <0.031)
Troponin I: 1.03 ng/mL (ref <0.031)

## 2014-05-30 LAB — BASIC METABOLIC PANEL
Anion gap: 12 (ref 5–15)
BUN: 29 mg/dL — ABNORMAL HIGH (ref 6–23)
CO2: 20 mmol/L (ref 19–32)
Calcium: 8.7 mg/dL (ref 8.4–10.5)
Chloride: 106 mmol/L (ref 96–112)
Creatinine, Ser: 1.12 mg/dL (ref 0.50–1.35)
GFR calc Af Amer: 81 mL/min — ABNORMAL LOW (ref >90)
GFR, EST NON AFRICAN AMERICAN: 70 mL/min — AB (ref >90)
Glucose, Bld: 178 mg/dL — ABNORMAL HIGH (ref 70–99)
Potassium: 3.8 mmol/L (ref 3.5–5.1)
SODIUM: 138 mmol/L (ref 135–145)

## 2014-05-30 LAB — POCT I-STAT 3, ART BLOOD GAS (G3+)
BICARBONATE: 24.4 meq/L — AB (ref 20.0–24.0)
O2 SAT: 96 %
PCO2 ART: 39.3 mmHg (ref 35.0–45.0)
TCO2: 26 mmol/L (ref 0–100)
pH, Arterial: 7.402 (ref 7.350–7.450)
pO2, Arterial: 80 mmHg (ref 80.0–100.0)

## 2014-05-30 LAB — GLUCOSE, CAPILLARY
GLUCOSE-CAPILLARY: 164 mg/dL — AB (ref 70–99)
GLUCOSE-CAPILLARY: 75 mg/dL (ref 70–99)
GLUCOSE-CAPILLARY: 53 mg/dL — AB (ref 70–99)
GLUCOSE-CAPILLARY: 176 mg/dL — AB (ref 70–99)

## 2014-05-30 LAB — CBC
HCT: 37 % — ABNORMAL LOW (ref 39.0–52.0)
HEMOGLOBIN: 12.2 g/dL — AB (ref 13.0–17.0)
MCH: 29 pg (ref 26.0–34.0)
MCHC: 33 g/dL (ref 30.0–36.0)
MCV: 88.1 fL (ref 78.0–100.0)
Platelets: 243 10*3/uL (ref 150–400)
RBC: 4.2 MIL/uL — ABNORMAL LOW (ref 4.22–5.81)
RDW: 14.5 % (ref 11.5–15.5)
WBC: 16.9 10*3/uL — AB (ref 4.0–10.5)

## 2014-05-30 LAB — HEPARIN LEVEL (UNFRACTIONATED)
HEPARIN UNFRACTIONATED: 0.12 [IU]/mL — AB (ref 0.30–0.70)
Heparin Unfractionated: 0.14 IU/mL — ABNORMAL LOW (ref 0.30–0.70)
Heparin Unfractionated: <0.1 IU/mL — ABNORMAL LOW (ref 0.30–0.70)

## 2014-05-30 LAB — HEPATIC FUNCTION PANEL
ALT: 23 U/L (ref 0–53)
AST: 31 U/L (ref 0–37)
Albumin: 2.7 g/dL — ABNORMAL LOW (ref 3.5–5.2)
Alkaline Phosphatase: 132 U/L — ABNORMAL HIGH (ref 39–117)
BILIRUBIN DIRECT: 0.6 mg/dL — AB (ref 0.0–0.5)
BILIRUBIN INDIRECT: 0.5 mg/dL (ref 0.3–0.9)
BILIRUBIN TOTAL: 1.1 mg/dL (ref 0.3–1.2)
Total Protein: 7.2 g/dL (ref 6.0–8.3)

## 2014-05-30 LAB — LIPID PANEL
CHOLESTEROL: 155 mg/dL (ref 0–200)
HDL: 33 mg/dL — ABNORMAL LOW (ref >39)
LDL CALC: 103 mg/dL — AB (ref 0–99)
Total CHOL/HDL Ratio: 4.7 RATIO
Triglycerides: 93 mg/dL (ref <150)
VLDL: 19 mg/dL (ref 0–40)

## 2014-05-30 LAB — PROTIME-INR
INR: 1.31 (ref 0.00–1.49)
Prothrombin Time: 16.4 seconds — ABNORMAL HIGH (ref 11.6–15.2)

## 2014-05-30 LAB — BRAIN NATRIURETIC PEPTIDE: B Natriuretic Peptide: 1665.6 pg/mL — ABNORMAL HIGH (ref 0.0–100.0)

## 2014-05-30 MED ORDER — LIDOCAINE HCL (CARDIAC) 20 MG/ML IV SOLN
INTRAVENOUS | Status: AC
  Filled 2014-05-30: qty 5

## 2014-05-30 MED ORDER — FENTANYL CITRATE (PF) 100 MCG/2ML IJ SOLN
100.0000 ug | Freq: Once | INTRAMUSCULAR | Status: AC
  Administered 2014-05-30: 100 ug via INTRAVENOUS

## 2014-05-30 MED ORDER — HEPARIN BOLUS VIA INFUSION
2000.0000 [IU] | Freq: Once | INTRAVENOUS | Status: AC
  Administered 2014-05-30: 2000 [IU] via INTRAVENOUS
  Filled 2014-05-30: qty 2000

## 2014-05-30 MED ORDER — METHYLPREDNISOLONE SODIUM SUCC 125 MG IJ SOLR
60.0000 mg | INTRAMUSCULAR | Status: DC
  Administered 2014-05-30 – 2014-06-02 (×4): 60 mg via INTRAVENOUS
  Filled 2014-05-30: qty 0.96
  Filled 2014-05-30: qty 2
  Filled 2014-05-30 (×2): qty 0.96

## 2014-05-30 MED ORDER — DEXMEDETOMIDINE HCL IN NACL 200 MCG/50ML IV SOLN
0.4000 ug/kg/h | INTRAVENOUS | Status: DC
Start: 2014-05-30 — End: 2014-06-02
  Administered 2014-05-30: 0.4 ug/kg/h via INTRAVENOUS
  Administered 2014-05-30: 0.2 ug/kg/h via INTRAVENOUS
  Administered 2014-05-31 – 2014-06-01 (×3): 0.4 ug/kg/h via INTRAVENOUS
  Filled 2014-05-30 (×5): qty 50

## 2014-05-30 MED ORDER — CHLORHEXIDINE GLUCONATE 0.12 % MT SOLN
15.0000 mL | Freq: Two times a day (BID) | OROMUCOSAL | Status: DC
  Administered 2014-05-30 – 2014-06-01 (×4): 15 mL via OROMUCOSAL
  Filled 2014-05-30 (×4): qty 15

## 2014-05-30 MED ORDER — MIDAZOLAM HCL 2 MG/2ML IJ SOLN
INTRAMUSCULAR | Status: AC
  Administered 2014-05-30: 2 mg via INTRAVENOUS
  Filled 2014-05-30: qty 2

## 2014-05-30 MED ORDER — ETOMIDATE 2 MG/ML IV SOLN
INTRAVENOUS | Status: AC
  Administered 2014-05-30: 20 mg
  Filled 2014-05-30: qty 20

## 2014-05-30 MED ORDER — FENTANYL CITRATE (PF) 100 MCG/2ML IJ SOLN
100.0000 ug | INTRAMUSCULAR | Status: DC | PRN
Start: 2014-05-30 — End: 2014-06-02
  Administered 2014-06-01: 100 ug via INTRAVENOUS
  Filled 2014-05-30: qty 2

## 2014-05-30 MED ORDER — ROCURONIUM BROMIDE 50 MG/5ML IV SOLN
INTRAVENOUS | Status: AC
  Administered 2014-05-30: 60 mg
  Filled 2014-05-30: qty 2

## 2014-05-30 MED ORDER — DEXTROSE 50 % IV SOLN
50.0000 mL | Freq: Once | INTRAVENOUS | Status: AC
  Administered 2014-05-30: 50 mL via INTRAVENOUS
  Filled 2014-05-30: qty 50

## 2014-05-30 MED ORDER — VANCOMYCIN HCL IN DEXTROSE 750-5 MG/150ML-% IV SOLN
750.0000 mg | Freq: Two times a day (BID) | INTRAVENOUS | Status: DC
  Administered 2014-05-30 – 2014-06-01 (×5): 750 mg via INTRAVENOUS
  Filled 2014-05-30 (×6): qty 150

## 2014-05-30 MED ORDER — SUCCINYLCHOLINE CHLORIDE 20 MG/ML IJ SOLN
INTRAMUSCULAR | Status: AC
  Filled 2014-05-30: qty 1

## 2014-05-30 MED ORDER — SODIUM CHLORIDE 0.9 % IV SOLN
50.0000 ug/h | INTRAVENOUS | Status: DC
  Administered 2014-05-30: 50 ug/h via INTRAVENOUS
  Filled 2014-05-30 (×2): qty 50

## 2014-05-30 MED ORDER — MIDAZOLAM HCL 2 MG/2ML IJ SOLN
2.0000 mg | Freq: Once | INTRAMUSCULAR | Status: AC
Start: 2014-05-30 — End: 2014-05-30
  Administered 2014-05-30: 2 mg via INTRAVENOUS

## 2014-05-30 MED ORDER — PANTOPRAZOLE SODIUM 40 MG IV SOLR
40.0000 mg | INTRAVENOUS | Status: DC
  Administered 2014-05-30 – 2014-06-01 (×3): 40 mg via INTRAVENOUS
  Filled 2014-05-30 (×5): qty 40

## 2014-05-30 MED ORDER — FENTANYL CITRATE (PF) 100 MCG/2ML IJ SOLN: 100.0000 ug | INTRAMUSCULAR | Status: DC | PRN

## 2014-05-30 MED ORDER — SODIUM CHLORIDE 0.9 % IV BOLUS (SEPSIS)
750.0000 mL | Freq: Once | INTRAVENOUS | Status: AC
  Administered 2014-05-30: 750 mL via INTRAVENOUS

## 2014-05-30 MED ORDER — CETYLPYRIDINIUM CHLORIDE 0.05 % MT LIQD
7.0000 mL | Freq: Four times a day (QID) | OROMUCOSAL | Status: DC
  Administered 2014-05-31 – 2014-06-01 (×6): 7 mL via OROMUCOSAL

## 2014-05-30 MED ORDER — IPRATROPIUM-ALBUTEROL 0.5-2.5 (3) MG/3ML IN SOLN
3.0000 mL | RESPIRATORY_TRACT | Status: DC
  Administered 2014-05-30 – 2014-06-01 (×12): 3 mL via RESPIRATORY_TRACT
  Filled 2014-05-30 (×14): qty 3

## 2014-05-30 MED ORDER — INSULIN DETEMIR 100 UNIT/ML ~~LOC~~ SOLN
15.0000 [IU] | Freq: Every day | SUBCUTANEOUS | Status: DC
  Administered 2014-05-31 – 2014-06-02 (×3): 15 [IU] via SUBCUTANEOUS
  Filled 2014-05-30 (×3): qty 0.15

## 2014-05-30 MED ORDER — DEXTROSE 50 % IV SOLN
INTRAVENOUS | Status: AC
  Administered 2014-05-30: 50 mL via INTRAVENOUS
  Filled 2014-05-30: qty 50

## 2014-05-30 MED ORDER — FENTANYL CITRATE (PF) 100 MCG/2ML IJ SOLN
INTRAMUSCULAR | Status: AC
  Administered 2014-05-30: 100 ug via INTRAVENOUS
  Filled 2014-05-30: qty 2

## 2014-05-30 NOTE — Progress Notes (Signed)
ANTIBIOTIC CONSULT NOTE  Pharmacy Consult for Cefepime and vancomycin Indication: rule out pneumonia and rule out sepsis  No Known Allergies  Patient Measurements: Height: 5\' 7"  (170.2 cm) Weight: 145 lb 8.1 oz (66 kg) IBW/kg (Calculated) : 66.1    Vital Signs: Temp: 99.8 F (37.7 C) (04/27 0900) Temp Source: Oral (04/27 0900) BP: 125/67 mmHg (04/27 0633) Pulse Rate: 98 (04/27 0633) Intake/Output from previous day: 04/26 0701 - 04/27 0700 In: 450 [I.V.:150; IV Piggyback:300] Out: 1775 [Urine:1775] Intake/Output from this shift:    Labs:  Recent Labs  05/29/14 1436 05/30/14 0331  WBC 12.5* 16.9*  HGB 11.6* 12.2*  PLT 297 243  CREATININE 1.02 1.12   Estimated Creatinine Clearance: 65.5 mL/min (by C-G formula based on Cr of 1.12). No results for input(s): VANCOTROUGH, VANCOPEAK, VANCORANDOM, GENTTROUGH, GENTPEAK, GENTRANDOM, TOBRATROUGH, TOBRAPEAK, TOBRARND, AMIKACINPEAK, AMIKACINTROU, AMIKACIN in the last 72 hours.   Microbiology: Recent Results (from the past 720 hour(s))  Blood culture (routine x 2)     Status: None (Preliminary result)   Collection Time: 05/29/14  3:10 PM  Result Value Ref Range Status   Specimen Description BLOOD LEFT ARM  Final   Special Requests BOTTLES DRAWN AEROBIC AND ANAEROBIC 5ML  Final   Culture   Final           BLOOD CULTURE RECEIVED NO GROWTH TO DATE CULTURE WILL BE HELD FOR 5 DAYS BEFORE ISSUING A FINAL NEGATIVE REPORT [facility]formed at [facility]    Report Status PENDING  Incomplete  Blood culture (routine x 2)     Status: None (Preliminary result)   Collection Time: 05/29/14  3:37 PM  Result Value Ref Range Status   Specimen Description BLOOD LEFT HAND  Final   Special Requests BOTTLES DRAWN AEROBIC AND ANAEROBIC 5CC  Final   Culture   Final           BLOOD CULTURE RECEIVED NO GROWTH TO DATE CULTURE WILL BE HELD FOR 5 DAYS BEFORE ISSUING A FINAL NEGATIVE REPORT Performed at [facility]    Report Status PENDING   Incomplete    Assessment: 59yo male with history of DM2, HTN and renal insufficiency s/p kidney transplant presents with SOB. Received doses of vancomycin and cefepime in the ED- cefepime continued upon transfer, vancomycin ordered to start again this morning.  WBC 16.9, tmax 99.8. Patient is on IV methylpred.   Goal of Therapy:  Vancomycin trough level 15-20 mcg/ml  Plan:  -Cefepime 2g IV q12h -Vancomycin 750mg  IV q12h  -follow up c/s, LOT (anticipate 8 days based on PNA focused order set), renal function[facility] -patient is also on fluconazole PTA which has been continued here  Vinson Tietze D. Maisy Newport, PharmD, BCPS Clinical Pharmacist Pager: [phone] 05/30/2014 10:59 AM

## 2014-05-30 NOTE — Progress Notes (Signed)
Subjective:  Patient denies any chest pain states breathing and leg swelling has improved still remained some BiPAP.  Objective:  Vital Signs in the last 24 hours: Temp:  [98.9 F (37.2 C)-99.4 F (37.4 C)] 98.9 F (37.2 C) (04/27 04540633) Pulse Rate:  [84-111] 98 (04/27 0633) Resp:  [14-47] 25 (04/27 0600) BP: (97-151)/(46-98) 125/67 mmHg (04/27 0633) SpO2:  [80 %-100 %] 97 % (04/27 0633) FiO2 (%):  [50 %-100 %] 100 % (04/27 0800) Weight:  [66 kg (145 lb 8.1 oz)] 66 kg (145 lb 8.1 oz) (04/27 [id number])  Intake/Output from previous day: 04/26 0701 - 04/27 0700 In: 450 [I.V.:150; IV Piggyback:300] Out: 1775 [Urine:1775] Intake/Output from this shift:    Physical Exam: Neck: no adenopathy, no carotid bruit and supple, symmetrical, trachea midline Lungs: faint bibasilar rales noted Heart: regular rate and rhythm, S1, S2 normal and soft systolic murmur and S3 gallop noted Abdomen: soft, non-tender; bowel sounds normal; no masses,  no organomegaly Extremities: no clubbing cyanosis 1+ edema noted  Lab Results:  Recent Labs  05/29/14 1436 05/30/14 0331  WBC 12.5* 16.9*  HGB 11.6* 12.2*  PLT 297 243    Recent Labs  05/29/14 1436 05/30/14 0331  NA 139 138  K 4.2 3.8  CL 109 106  CO2 20 20  GLUCOSE 211* 178*  BUN 27* 29*  CREATININE 1.02 1.12    Recent Labs  05/29/14 2340 05/30/14 0331  TROPONINI 1.03* 1.01*   Hepatic Function Panel  Recent Labs  05/29/14 2340  PROT 7.2  ALBUMIN 2.7*  AST 31  ALT 23  ALKPHOS 132*  BILITOT 1.1  BILIDIR 0.6*  IBILI 0.5    Recent Labs  05/30/14 0331  CHOL 155   No results for input(s): PROTIME in the last 72 hours.  Imaging: Imaging results have been reviewed and Dg Chest Portable 1 View  05/29/2014   CLINICAL DATA:  Shortness of breath for a few days. Hypertension and diabetes  EXAM: PORTABLE CHEST - 1 VIEW  COMPARISON:  01/09/1999  FINDINGS: Dense patchy bilateral airspace disease, asymmetric to the right. There is  mild cardiomegaly without interstitial edema or pleural effusion. Negative aortic and hilar contours.  IMPRESSION: Bilateral airspace disease consistent with pneumonia.   Electronically Signed   By: Marnee SpringJonathon  Watts M.D.   On: 05/29/2014 14:41    Cardiac Studies:  Assessment/Plan:  Possible small non-Q-wave myocardial infarction Coronary artery disease history of possible silent anteroseptal wall MI in the past Mild decompensated systolic heart failure improved Possible bilateral pneumonia Hypertension Insulin requiring diabetes mellitus Peripheral neuropathy Anemia of chronic disease Depression Anxiety disorder End-stage renal disease status post renal transplant on chronic immunosuppressive therapy Plan Continue present management Wean off BiPAP as tolerated Will schedule for cardiac Possible PTCA stenting once fully compensated discussed with patient and family and agrees. Check 2-D echo  LOS: 1 day    Steven Riddle 05/30/2014, 9:22 AM

## 2014-05-30 NOTE — Procedures (Signed)
Intubation Procedure Note Steven Riddle [id number] 07-26-1955  Procedure: Intubation Indications: Respiratory insufficiency  Procedure Details Consent: Risks of procedure as well as the alternatives and risks of each were explained to the (patient/caregiver).  Consent for procedure obtained. Time Out: Verified patient identification, verified procedure, site/side was marked, verified correct patient position, special equipment/implants available, medications/allergies/relevent history reviewed, required imaging and test results available.  Performed  MAC and 4 Medications:  Fentanyl 100 mcg Etomidate 20 mg Versed 2 mg NMB rocuronium 60 mg    Evaluation Hemodynamic Status: BP stable throughout; O2 sats: stable throughout Patient's Current Condition: stable Complications: No apparent complications Patient did tolerate procedure well. Chest X-ray ordered to verify placement.  CXR: pending.   Brett CanalesSteve Minor ACNP [facility] Pager [phone] till 3 pm If no answer page [phone] 05/30/2014, 12:52 PM  I was present and supervised the entire procedure.  Alyson ReedyWesam G. Yacoub, M.D. [facility]. Pager: [phone]. After hours pager: [phone].

## 2014-05-30 NOTE — ED Notes (Signed)
Patient anxious and wanting to take off the BiPap.  BiPap removed and NRB applied (15l) Dr Rhunette CroftNanavati aware.  Dr. Montez Moritaarter called and aware of patient not wanting to take his meds.  Dr Lorenza EvangelistHarvani paged.

## 2014-05-30 NOTE — Progress Notes (Signed)
RT Note  Pt transported on BIPAP to 2H12. Tolerated well and RT to monitor as needed.

## 2014-05-30 NOTE — ED Notes (Signed)
Patient c/o feeling nauseated.  Struggling to breath at this time but does not want to go on the BiPap.

## 2014-05-30 NOTE — Progress Notes (Signed)
Paged DR for plan & order for BiPAP, Pt could not tol off this am. PCCM consulted by DR[facility]

## 2014-05-30 NOTE — ED Notes (Signed)
Patient has agreed to be put back on the BiPap.  Daughter at bedside.

## 2014-05-30 NOTE — ED Notes (Signed)
Dr Montez Moritaarter notified of trop. 1.03

## 2014-05-30 NOTE — Progress Notes (Signed)
ANTICOAGULATION CONSULT NOTE - Follow Up Consult  Pharmacy Consult for heparin Indication: chest pain/ACS  Labs:  Recent Labs  05/29/14 1436 05/29/14 2340 05/30/14 0331 05/30/14 1215 05/30/14 2100  HGB 11.6*  --  12.2*  --   --   HCT 35.5*  --  37.0*  --   --   PLT 297  --  243  --   --   LABPROT  --  16.4*  --   --   --   INR  --  1.31  --   --   --   HEPARINUNFRC  --   --  0.14* 0.12* <0.10*  CREATININE 1.02  --  1.12  --   --   TROPONINI  --  1.03* 1.01* 0.96*  --     Assessment: 58yo male undetectable on heparin after resumed s/p procedures; of note heparin was not charted as resumed but night RN reports that heparin has been running since before her shift started at 1900, no report from day RN when it was resumed.  Goal of Therapy:  Heparin level 0.3-0.7 units/ml   Plan:  Will increase heparin gtt by 3 units/kg/hr to 1300 units/hr and check level in 6hr.  Steven Riddle, PharmD, BCPS  05/30/2014,11:27 PM

## 2014-05-30 NOTE — Progress Notes (Signed)
ANTICOAGULATION CONSULT NOTE  Pharmacy Consult for Heparin Indication: chest pain/ACS  No Known Allergies  Patient Measurements: Height: 5\' 7"  (170.2 cm) Weight: 145 lb 8.1 oz (66 kg) IBW/kg (Calculated) : 66.1 Heparin Dosing Weight: 68 kg  Vital Signs: Temp: 99.8 F (37.7 C) (04/27 0900) Temp Source: Oral (04/27 0900) BP: 112/80 mmHg (04/27 0923) Pulse Rate: 97 (04/27 0923)  Labs:  Recent Labs  05/29/14 1436 05/29/14 2340 05/30/14 0331 05/30/14 1215  HGB 11.6*  --  12.2*  --   HCT 35.5*  --  37.0*  --   PLT 297  --  243  --   LABPROT  --  16.4*  --   --   INR  --  1.31  --   --   HEPARINUNFRC  --   --  0.14* 0.12*  CREATININE 1.02  --  1.12  --   TROPONINI  --  1.03* 1.01* 0.96*    Estimated Creatinine Clearance: 65.5 mL/min (by C-G formula based on Cr of 1.12).   Assessment: 59yo male with HTN, DM and HLD presents with SOB. Pharmacy is consulted to dose heparin for ACS/chest pain. Patient was on 3S this morning, but had worsening respiartory status and was [facility] to [facility] to be intubated. Patient transferred ~noon and heparin was held at that time in anticipation of intubation, central line placement and bronchoscopy. Heparin level was drawn around 1230 and was low since infusion had been stopped.    Hgb 12.2, plts 243- no overt bleeding noted.   Goal of Therapy:  Heparin level 0.3-0.7 units/ml Monitor platelets by anticoagulation protocol: Yes   Plan:  -spoke with RN, to resume heparin infusion at 1100 units/hr at ~3pm today -6h HL at 2100 -daily HL and CBC  Atiyah [facility] D. Tinlee Navarrette, PharmD, BCPS Clinical Pharmacist Pager: [phone] 05/30/2014 2:55 PM

## 2014-05-30 NOTE — Progress Notes (Signed)
Pt to TX to 2H-12, VSS, called report. Pt to TX on BiPAP w/ RT. Family present & aware of TX.

## 2014-05-30 NOTE — Progress Notes (Signed)
ANTICOAGULATION CONSULT NOTE - Follow Up Consult  Pharmacy Consult for heparin Indication: chest pain/ACS  Labs:  Recent Labs  05/29/14 1436 05/29/14 2340 05/30/14 0331  HGB 11.6*  --  12.2*  HCT 35.5*  --  37.0*  PLT 297  --  243  LABPROT  --  16.4*  --   INR  --  1.31  --   HEPARINUNFRC  --   --  0.14*  CREATININE 1.02  --  1.12  TROPONINI  --  1.03*  --       Assessment: 58yo male subtherapeutic on heparin with initial dosing for possible NQWMI.  Goal of Therapy:  Heparin level 0.3-0.7 units/ml   Plan:  Will rebolus with heparin 2000 units and increase gtt by 4 units/kg/hr to 1100 units/hr and check level in 8hr.  Steven GamblesVeronda Zaleah Riddle, PharmD, BCPS  05/30/2014,4:18 AM

## 2014-05-30 NOTE — Progress Notes (Signed)
Received report from ED.  

## 2014-05-30 NOTE — Progress Notes (Signed)
PROGRESS NOTE  Steven Riddle OZH:[id number] DOB: 1955/10/11 DOA: 05/29/2014 PCP: Romero BellingELLISON, SEAN, MD  HPI/Recap of past 24 hours:  On heparin drip/lasix/broad spectrum abx, not able to wean off bipap, critical care consulted.add nebulizer, iv steroids. Wife and daughter at bedside  Assessment/Plan: Principal Problem:   CHF (congestive heart failure) Active Problems:   DM type 2 causing renal disease   CAP (community acquired pneumonia)   Hypoxia   NSTEMI (non-ST elevated myocardial infarction)  Hypoxic respiratory failure: likely combination of pna and chf. On vanc/cefepime/lasix/ bipap. Not able to wean off bipap, pccm consulted, transfer to ICU in anticipation of intubation.  NSTEMI: on heparin drip for now, not able take plavix due to on bipap, cards following. Echo pending.  CHF: seems respond to lasix, with resolving lower extremity edema per patient, continue lasix, monitor cr. Echo pending, cards following  PNA: bilateral, with h/o immunosuppression. On bipap/vanc/cefepime, add stress dose steroids, nebs, pccm consulted to ICU.  H/o renal transplant on chronic immunosuppression, oral meds on hold, may resume per NG if patient intubted. Stress dos steroids for now. Cr stable.  Insulin dependent DM2, currently npo due to bipap, on ssi.  DVT prophylaxis: currently on heparin drip  Code Status: full, verified with patient and family  Family Communication: patient Public affairs consultant/wife/daughter  Disposition Plan: to ICU   Consultants:  PCCM  Cards  Procedures:  bipap  Antibiotics:  vanc/cefepime   Objective: BP 125/67 mmHg  Pulse 98  Temp(Src) 98.9 F (37.2 C) (Axillary)  Resp 25  Ht 5\' 7"  (1.702 m)  Wt 66 kg (145 lb 8.1 oz)  BMI 22.78 kg/m2  SpO2 97%  Intake/Output Summary (Last 24 hours) at 05/30/14 1001 Last data filed at 05/30/14 [id number]  Gross per 24 hour  Intake    450 ml  Output   1775 ml  Net  -1325 ml   Filed Weights   05/30/14 [id number]  Weight: 66 kg  (145 lb 8.1 oz)    Exam:   General:  NAD on bipap  Cardiovascular: RRR  Respiratory: bilateral crackles  Abdomen: Soft/ND/NT, positive BS  Musculoskeletal: No Edema  Neuro: aaox3, no focal deficit  Data Reviewed: Basic Metabolic Panel:  Recent Labs Lab 05/29/14 1436 05/30/14 0331  NA 139 138  K 4.2 3.8  CL 109 106  CO2 20 20  GLUCOSE 211* 178*  BUN 27* 29*  CREATININE 1.02 1.12  CALCIUM 8.1* 8.7   Liver Function Tests:  Recent Labs Lab 05/29/14 2340  AST 31  ALT 23  ALKPHOS 132*  BILITOT 1.1  PROT 7.2  ALBUMIN 2.7*   No results for input(s): LIPASE, AMYLASE in the last 168 hours. No results for input(s): AMMONIA in the last 168 hours. CBC:  Recent Labs Lab 05/29/14 1436 05/30/14 0331  WBC 12.5* 16.9*  NEUTROABS 10.7*  --   HGB 11.6* 12.2*  HCT 35.5* 37.0*  MCV 87.0 88.1  PLT 297 243   Cardiac Enzymes:    Recent Labs Lab 05/29/14 2340 05/30/14 0331  TROPONINI 1.03* 1.01*   BNP (last 3 results)  Recent Labs  05/29/14 1436 05/30/14 0412  BNP 1279.8* 1665.6*    ProBNP (last 3 results) No results for input(s): PROBNP in the last 8760 hours.  CBG:  Recent Labs Lab 05/29/14 1422 05/29/14 2225  GLUCAP 229* 197*    Recent Results (from the past 240 hour(s))  Blood culture (routine x 2)     Status: None (Preliminary result)   Collection Time: 05/29/14  3:10 PM  Result Value Ref Range Status   Specimen Description BLOOD LEFT ARM  Final   Special Requests BOTTLES DRAWN AEROBIC AND ANAEROBIC  Final   Culture   Final           BLOOD CULTURE RECEIVED NO GROWTH TO DATE CULTURE WILL BE HELD FOR 5 DAYS BEFORE ISSUING A FINAL NEGATIVE REPORT Performed at [facility]    Report Status PENDING  Incomplete  Blood culture (routine x 2)     Status: None (Preliminary result)   Collection Time: 05/29/14  3:37 PM  Result Value Ref Range Status   Specimen Description BLOOD LEFT HAND  Final   Special Requests BOTTLES DRAWN  AEROBIC AND ANAEROBIC 5CC  Final   Culture   Final           BLOOD CULTURE RECEIVED NO GROWTH TO DATE CULTURE WILL BE HELD FOR 5 DAYS BEFORE ISSUING A FINAL NEGATIVE REPORT Performed at [facility]    Report Status PENDING  Incomplete     Studies: Dg Chest Portable 1 View  05/29/2014   CLINICAL DATA:  Shortness of breath for a few days. Hypertension and diabetes  EXAM: PORTABLE CHEST - 1 VIEW  COMPARISON:  01/09/1999  FINDINGS: Dense patchy bilateral airspace disease, asymmetric to the right. There is mild cardiomegaly without interstitial edema or pleural effusion. Negative aortic and hilar contours.  IMPRESSION: Bilateral airspace disease consistent with pneumonia.   Electronically Signed   By: Marnee Spring M.D.   On: 05/29/2014 14:41    Scheduled Meds: . aspirin  81 mg Oral Daily  . atorvastatin  40 mg Oral q1800  . carvedilol  3.125 mg Oral BID WC  . ceFEPime (MAXIPIME) IV  2 g Intravenous Q12H  . clopidogrel  75 mg Oral Daily  . cycloSPORINE  100 mg Oral BID  . fluconazole  150 mg Oral Daily  . furosemide  40 mg Intravenous Q12H  . insulin aspart  0-15 Units Subcutaneous TID WC  . insulin detemir  30 Units Subcutaneous Daily  . ipratropium-albuterol  3 mL Nebulization Q4H  . methylPREDNISolone (SOLU-MEDROL) injection  60 mg Intravenous Q24H  . mycophenolate  180 mg Oral Daily  . predniSONE  5 mg Oral Daily    Continuous Infusions: . heparin 1,100 Units/hr (05/30/14 1610)     Time spent: >32mins  Oseias Horsey MD, PhD  [facility] Pager [phone]. If 7PM-7AM, please contact night-coverage at [email or web address], password [facility] 05/30/2014, 10:01 AM  LOS: 1 day

## 2014-05-30 NOTE — ED Notes (Signed)
Patient resting in bed, son at bedside.  Patient on BiPap and having trouble tolerating it.  Encouraged patient to take deep breaths and try to relax.  Son stating that he doesn't have to do anything he does not want to do.  Explained to him that I am here to help the patient and will not do anything to harm him.

## 2014-05-30 NOTE — Procedures (Addendum)
Bronchoscopy Procedure Note Steven Riddle [id number] 05/03/1955  Procedure: Bronchoscopy Indications: Obtain specimens for culture and/or other diagnostic studies  Procedure Details Consent: Unable to obtain consent because of emergent medical necessity. Time Out: Verified patient identification, verified procedure, site/side was marked, verified correct patient position, special equipment/implants available, medications/allergies/relevent history reviewed, required imaging and test results available.  Performed  In preparation for procedure, patient was given 100% FiO2. Sedation: Benzodiazepines, Muscle relaxants and Etomidate  Airway entered and the following bronchi were examined: RUL, RML, RLL, LUL, LLL and Bronchi.   Bronchoscope removed.    Evaluation Hemodynamic Status: BP stable throughout; O2 sats: stable throughout Patient's Current Condition: stable Specimens:  Sent purulent fluid Complications: No apparent complications Patient did tolerate procedure well.  Two samples obtained one from RLL and one from LLL.  Steven Riddle,Steven Riddle 05/30/2014

## 2014-05-30 NOTE — Progress Notes (Signed)
eLink Physician-Brief Progress Note Patient Name: Steven Riddle DOB: [birth year] MRN: [id number]   Date of Service  05/30/2014  HPI/Events of Note    eICU Interventions  OG tube placed to allow enteral meds     Intervention Category Minor Interventions: Routine modifications to care plan (e.g. PRN medications for pain, fever)  BYRUM,ROBERT S. 05/30/2014, 8:46 PM

## 2014-05-30 NOTE — ED Notes (Signed)
Steven SchwalbeDebbie Dohrman (wife) cell [phone], home [phone]

## 2014-05-30 NOTE — Consult Note (Signed)
PULMONARY / CRITICAL CARE MEDICINE   Name: Steven Riddle MRN: [id number] DOB: [birth year]    ADMISSION DATE:  05/29/2014 CONSULTATION DATE:  4/27  REFERRING MD : [facility]  CHIEF COMPLAINT:  SOB  INITIAL PRESENTATION: SOB  STUDIES:    SIGNIFICANT EVENTS:    HISTORY OF PRESENT ILLNESS:  [name] is a  59 yo [place] male, renal tranpslant in 2013 in [place], who presents on 4/26 from Endocrinologist office with O2 sats 78% on room air. Notes one week of cough, white sputum, lower ext edema, with out FCS or chest pain. Noted to have + tropin's without acute EKG changes but he has Rt BBB. No prior cardiac history. CxR on admit 4/26 with BASDZ and he's been on nimvs >24 hours and remains on 100% and unable to come off. I suspect he needs intubation, sputum culture , possible FOB, move to ICU. If cards wants  to cath they can do while intubated and airway secured.  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  has past surgical history that includes Nephrectomy transplanted organ. Prior to Admission medications   Medication Sig Start Date End Date Taking? Authorizing Provider  aspirin 81 MG tablet Take 81 mg by mouth daily.     Yes Historical Provider, MD  BD ULTRA-FINE PEN NEEDLES 29G X 12.7MM MISC USE AS DIRECTED 02/28/11   Steven BellingSean Ellison, MD  cycloSPORINE (SANDIMMUNE) 100 MG capsule Take 100 mg by mouth 2 (two) times daily.    Yes Historical Provider, MD  fluconazole (DIFLUCAN) 150 MG tablet Take 150 mg by mouth daily.   Yes Historical Provider, MD  insulin detemir (LEVEMIR FLEXPEN) 100 UNIT/ML injection Inject 15-20 Units into the  skin 2 (two) times daily. 15 units every morning and use 20 units every evening 06/09/10  Yes Steven BellingSean Ellison, MD  mycophenolate (MYFORTIC) 180 MG EC tablet Take 180 mg by mouth daily.    Yes Historical Provider, MD  predniSONE (DELTASONE) 5 MG tablet Take 5 mg by mouth daily.   Yes Historical Provider, MD   No Known Allergies  FAMILY HISTORY:  has no family status information on file.  SOCIAL HISTORY:  reports that he has never smoked. He does not have any smokeless tobacco history on file. He reports that he does not drink alcohol or use illicit drugs.  REVIEW OF SYSTEMS:   10 point review of system taken, please see HPI for positives and negatives.   SUBJECTIVE:   VITAL SIGNS: Temp:  [98.9 F (37.2 C)-99.8 F (37.7 C)] 99.8 F (37.7 C) (04/27 0900) Pulse Rate:  [84-111] 98 (04/27 0633) Resp:  [14-47] 25 (04/27 0600) BP: (97-151)/(46-98) 125/67 mmHg (04/27 0633) SpO2:  [80 %-100 %] 97 % (04/27 0633) FiO2 (%):  [50 %-100 %] 100 % (04/27 0800) Weight:  [145 lb 8.1 oz (66 kg)] 145 lb 8.1 oz (66 kg) (04/27 [id number]) HEMODYNAMICS:   VENTILATOR SETTINGS: Vent Mode:  [-] BIPAP FiO2 (%):  [50 %-100 %] 100 % Set Rate:  [8 bmp] 8 bmp PEEP:  [6 cmH20-7 cmH20] 6 cmH20 INTAKE / OUTPUT:  Intake/Output Summary (Last 24 hours) at 05/30/14 1022 Last data filed at 05/30/14 0635  Gross per 24 hour  Intake    450 ml  Output   1775 ml  Net  -  1325 ml    PHYSICAL EXAMINATION: General:  WNWD male on bipap NAD on bipap Neuro:  Intact, follows commands HEENT:  No LAN/JVD Cardiovascular: HSR RRRR Lungs:  Decreased bs Abdomen: Soft + bs Musculoskeletal: Intact Skin:  Warm and dry  LABS:  CBC  Recent Labs Lab 05/29/14 1436 05/30/14 0331  WBC 12.5* 16.9*  HGB 11.6* 12.2*  HCT 35.5* 37.0*  PLT 297 243   Coag's  Recent Labs Lab 05/29/14 2340  INR 1.31   BMET  Recent Labs Lab 05/29/14 1436 05/30/14 0331  NA 139 138  K 4.2 3.8  CL 109 106  CO2 20 20  BUN 27* 29*   CREATININE 1.02 1.12  GLUCOSE 211* 178*   Electrolytes  Recent Labs Lab 05/29/14 1436 05/30/14 0331  CALCIUM 8.1* 8.7   Sepsis Markers  Recent Labs Lab 05/29/14 1443 05/29/14 1939  LATICACIDVEN 1.14 1.31   ABG  Recent Labs Lab 05/29/14 1532  PHART 7.363  PCO2ART 33.8*  PO2ART 66.0*   Liver Enzymes  Recent Labs Lab 05/29/14 2340  AST 31  ALT 23  ALKPHOS 132*  BILITOT 1.1  ALBUMIN 2.7*   Cardiac Enzymes  Recent Labs Lab 05/29/14 2340 05/30/14 0331  TROPONINI 1.03* 1.01*   Glucose  Recent Labs Lab 05/29/14 1422 05/29/14 2225 05/30/14 0926  GLUCAP 229* 197* 164*    Imaging Dg Chest Portable 1 View  05/29/2014   CLINICAL DATA:  Shortness of breath for a few days. Hypertension and diabetes  EXAM: PORTABLE CHEST - 1 VIEW  COMPARISON:  01/09/1999  FINDINGS: Dense patchy bilateral airspace disease, asymmetric to the right. There is mild cardiomegaly without interstitial edema or pleural effusion. Negative aortic and hilar contours.  IMPRESSION: Bilateral airspace disease consistent with pneumonia.   Electronically Signed   By: Steven Riddle M.D.   On: 05/29/2014 14:41     ASSESSMENT / PLAN:  PULMONARY OETT* A: BASDZ in renal transplant pt, on steroids, failed NIMVS, + trop P:   ICU Intubate FOB/sputum culture Vent bundle  CARDIOVASCULAR CVL A:  + trop RBBB Lower ext edema P:  Intubation is probaly the best route with all things considered. IE + trop, renal transplant on steroids,  Failed nimvs. Move to ICU Pursue intubation Heparin per cards ? CC  RENAL A:  Renal transplant 2013 P:   Renal consult Careful with dye if CC performed  GASTROINTESTINAL A:  Recent Nausea P:   Counter indication to NIMVS Antiemetic   HEMATOLOGIC A:  Chronic immunosuppresion P:  Follow labs  INFECTIOUS A:  Suspected pna Lactic acid 1.31 P:   BCx 24/26>> UC 4/26>> Sputum 4/27>> Abx:  4/27 cefepime>> 4/27 diflucan(was on as  opt)>> 4/27 vanc>>  ENDOCRINE A:   DM  P:   SSI  NEUROLOGIC A:  Intact P:   RASS goal: 0 No acute issue unless he gets intubated   FAMILY  - Updates: Wife at bedside. Possible need for intubation in future.  - Inter-disciplinary family meet or Palliative Care meeting due by:  day 7    TODAY'S SUMMARY:  Steven Riddle is a  59 yo [place] male, renal tranpslant in 2013 in [place], who presents on 4/26 from Endocrinologist office with O2 sats 78% on room air. Notes one week of cough, white sputum, lower ext edema, with out FCS or chest pain. Noted to have + tropin's without acute EKG changes but he has Rt BBB. No prior cardiac history. CxR on admit 4/26 with BASDZ and he's  been on nimvs >24 hours and remains on 100% and unable to come off. I suspect he needs intubation, sputum culture , possible FOB, move to ICU. If cards wants  to cath they can do while intubated and airway secured.  Steven Riddle ACNP Steven Riddle PCCM Pager [phone] till 3 pm If no answer page [phone] 05/30/2014, 10:36 AM  Acute hypoxic respiratory failure, CXR consistent with PNA and pulmonary edema.  Patient on BiPAP at 100% FiO2 with sats in the low 90s.  AMI as well.  Will move to the ICU.  Intubate then bronch (immune compromised).  Continue abx as ordered.  Will F/U.  ABG post intubation then will likely need high PEEP.  Hold further diureses.  F/U CVP to manage fluid.  Spoke with son.  Full code status.  The patient is critically ill with multiple organ systems failure and requires high complexity decision making for assessment and support, frequent evaluation and titration of therapies, application of advanced monitoring technologies and extensive interpretation of multiple databases.   Critical Care Time devoted to patient care services described in this note is  35  Minutes. This time reflects time of care of this signee Dr Koren Bound. This critical care time does not reflect procedure time, or teaching time or  supervisory time of PA/NP/Med student/Med Resident etc but could involve care discussion time.  Steven Riddle, M.D. [facility] Pulmonary/Critical Care Medicine. Pager: [phone]. After hours pager: [phone].

## 2014-05-30 NOTE — Procedures (Signed)
Central Venous Catheter Insertion Procedure Note Elliot Cousinmanullah Lapp [id number] 1955-12-30  Procedure: Insertion of Central Venous Catheter Indications: Assessment of intravascular volume, Drug and/or fluid administration and Frequent blood sampling  Procedure Details Consent: Risks of procedure as well as the alternatives and risks of each were explained to the (patient/caregiver).  Consent for procedure obtained. Time Out: Verified patient identification, verified procedure, site/side was marked, verified correct patient position, special equipment/implants available, medications/allergies/relevent history reviewed, required imaging and test results available.  Performed  Maximum sterile technique was used including antiseptics, cap, gloves, gown, hand hygiene, mask and sheet. Skin prep: Chlorhexidine; local anesthetic administered A antimicrobial bonded/coated triple lumen catheter was placed in the left internal jugular vein using the Seldinger technique. Ultrasound guidance used.Yes.   Catheter placed to 20 cm. Blood aspirated via all 3 ports and then flushed x 3. Line sutured x 2 and dressing applied.  Evaluation Blood flow good Complications: No apparent complications Patient did tolerate procedure well. Chest X-ray ordered to verify placement.  CXR: pending.  Brett CanalesSteve Minor ACNP [facility] P[facility] Pager [phone] till 3 pm If no answer page [phone] 05/30/2014, 12:54 PM  U/S used in placement.  I was present and supervised the entire procedure.  Alyson ReedyWesam G. Antoney Biven, M.D. [facility] Pulmonary/Critical Care Medicine. Pager: [phone]. After hours pager: [phone].

## 2014-05-30 NOTE — Procedures (Signed)
Intubation Procedure Note Steven Riddle [id number] 04/25/1955  Procedure: Intubation Indications: Airway protection and maintenance  Procedure Details Consent: Risks of procedure as well as the alternatives and risks of each were explained to the (patient/caregiver).  Consent for procedure obtained. Time Out: Verified patient identification, verified procedure, site/side was marked, verified correct patient position, special equipment/implants available, medications/allergies/relevent history reviewed, required imaging and test results available.  Performed  Maximum sterile technique was used including cap, gloves, gown, hand hygiene and mask.  MAC and 4    Evaluation Hemodynamic Status: BP stable throughout; O2 sats: stable throughout Patient's Current Condition: stable Complications: No apparent complications Patient did tolerate procedure well. Chest X-ray ordered to verify placement.  CXR: pending.   Lilli LightSchleuning, Maggie Senseney D 05/30/2014

## 2014-05-31 ENCOUNTER — Inpatient Hospital Stay (HOSPITAL_COMMUNITY): Payer: PPO

## 2014-05-31 DIAGNOSIS — R0902 Hypoxemia: Secondary | ICD-10-CM

## 2014-05-31 LAB — POCT I-STAT 3, ART BLOOD GAS (G3+)
Acid-base deficit: 2 mmol/L (ref 0.0–2.0)
BICARBONATE: 21.7 meq/L (ref 20.0–24.0)
O2 SAT: 97 %
TCO2: 23 mmol/L (ref 0–100)
pCO2 arterial: 33.2 mmHg — ABNORMAL LOW (ref 35.0–45.0)
pH, Arterial: 7.419 (ref 7.350–7.450)
pO2, Arterial: 82 mmHg (ref 80.0–100.0)

## 2014-05-31 LAB — CBC
HEMATOCRIT: 34.1 % — AB (ref 39.0–52.0)
Hemoglobin: 11 g/dL — ABNORMAL LOW (ref 13.0–17.0)
MCH: 28.1 pg (ref 26.0–34.0)
MCHC: 32.3 g/dL (ref 30.0–36.0)
MCV: 87.2 fL (ref 78.0–100.0)
PLATELETS: 314 10*3/uL (ref 150–400)
RBC: 3.91 MIL/uL — AB (ref 4.22–5.81)
RDW: 14.6 % (ref 11.5–15.5)
WBC: 12.7 10*3/uL — AB (ref 4.0–10.5)

## 2014-05-31 LAB — MAGNESIUM: MAGNESIUM: 1.5 mg/dL (ref 1.5–2.5)

## 2014-05-31 LAB — HEMOGLOBIN A1C
Hgb A1c MFr Bld: 12.1 % — ABNORMAL HIGH (ref 4.8–5.6)
MEAN PLASMA GLUCOSE: 301 mg/dL

## 2014-05-31 LAB — HEPARIN LEVEL (UNFRACTIONATED)
Heparin Unfractionated: 0.42 IU/mL (ref 0.30–0.70)
Heparin Unfractionated: 0.63 IU/mL (ref 0.30–0.70)

## 2014-05-31 LAB — BASIC METABOLIC PANEL
ANION GAP: 10 (ref 5–15)
BUN: 27 mg/dL — ABNORMAL HIGH (ref 6–23)
CALCIUM: 8.6 mg/dL (ref 8.4–10.5)
CO2: 25 mmol/L (ref 19–32)
Chloride: 108 mmol/L (ref 96–112)
Creatinine, Ser: 1.11 mg/dL (ref 0.50–1.35)
GFR calc non Af Amer: 70 mL/min — ABNORMAL LOW (ref >90)
GFR, EST AFRICAN AMERICAN: 82 mL/min — AB (ref >90)
Glucose, Bld: 198 mg/dL — ABNORMAL HIGH (ref 70–99)
Potassium: 3.9 mmol/L (ref 3.5–5.1)
Sodium: 143 mmol/L (ref 135–145)

## 2014-05-31 LAB — GLUCOSE, CAPILLARY
GLUCOSE-CAPILLARY: 154 mg/dL — AB (ref 70–99)
GLUCOSE-CAPILLARY: 287 mg/dL — AB (ref 70–99)
GLUCOSE-CAPILLARY: 255 mg/dL — AB (ref 70–99)
Glucose-Capillary: 145 mg/dL — ABNORMAL HIGH (ref 70–99)
Glucose-Capillary: 211 mg/dL — ABNORMAL HIGH (ref 70–99)

## 2014-05-31 LAB — PHOSPHORUS: Phosphorus: 4 mg/dL (ref 2.3–4.6)

## 2014-05-31 MED ORDER — FUROSEMIDE 10 MG/ML IJ SOLN
5.0000 mg/h | INTRAMUSCULAR | Status: DC
  Administered 2014-05-31: 5 mg/h via INTRAVENOUS
  Filled 2014-05-31: qty 25

## 2014-05-31 MED ORDER — POTASSIUM CHLORIDE 20 MEQ/15ML (10%) PO SOLN
40.0000 meq | Freq: Three times a day (TID) | ORAL | Status: AC
  Administered 2014-05-31 (×2): 40 meq
  Filled 2014-05-31 (×2): qty 30

## 2014-05-31 MED ORDER — INSULIN ASPART 100 UNIT/ML ~~LOC~~ SOLN
0.0000 [IU] | SUBCUTANEOUS | Status: DC
  Administered 2014-05-31: 3 [IU] via SUBCUTANEOUS
  Administered 2014-06-01: 8 [IU] via SUBCUTANEOUS
  Administered 2014-06-01: 5 [IU] via SUBCUTANEOUS
  Administered 2014-06-01 (×2): 15 [IU] via SUBCUTANEOUS
  Administered 2014-06-01: 8 [IU] via SUBCUTANEOUS
  Administered 2014-06-01 – 2014-06-02 (×3): 3 [IU] via SUBCUTANEOUS

## 2014-05-31 MED ORDER — FAMOTIDINE IN NACL 20-0.9 MG/50ML-% IV SOLN
20.0000 mg | Freq: Two times a day (BID) | INTRAVENOUS | Status: DC
  Administered 2014-05-31 – 2014-06-02 (×5): 20 mg via INTRAVENOUS
  Filled 2014-05-31 (×8): qty 50

## 2014-05-31 MED ORDER — DAPSONE 100 MG PO TABS
100.0000 mg | ORAL_TABLET | Freq: Every day | ORAL | Status: DC
  Administered 2014-05-31 – 2014-06-01 (×2): 100 mg via ORAL
  Filled 2014-05-31 (×2): qty 1

## 2014-05-31 MED ORDER — METOLAZONE 5 MG PO TABS
5.0000 mg | ORAL_TABLET | Freq: Every day | ORAL | Status: AC
  Administered 2014-05-31: 5 mg via ORAL
  Filled 2014-05-31: qty 1

## 2014-05-31 MED ORDER — VITAL AF 1.2 CAL PO LIQD
1000.0000 mL | ORAL | Status: DC
  Administered 2014-05-31: 1000 mL
  Filled 2014-05-31 (×5): qty 1000

## 2014-05-31 MED ORDER — PROMETHAZINE HCL 25 MG/ML IJ SOLN: 12.5000 mg | Freq: Four times a day (QID) | INTRAMUSCULAR | Status: DC | PRN

## 2014-05-31 MED ORDER — MAGNESIUM SULFATE 2 GM/50ML IV SOLN
2.0000 g | Freq: Once | INTRAVENOUS | Status: AC
  Administered 2014-05-31: 2 g via INTRAVENOUS
  Filled 2014-05-31: qty 50

## 2014-05-31 MED ORDER — VITAL HIGH PROTEIN PO LIQD: 1000.0000 mL | ORAL | Status: DC

## 2014-05-31 NOTE — Progress Notes (Signed)
Initial Nutrition Assessment  DOCUMENTATION CODES:  Severe malnutrition in context of acute illness/injury   Pt meets criteria for severe MALNUTRITION in the context of acute illness as evidenced by moderate depletion of muscle mass and intake </= 50% of estimated energy requirement for >/= 5 days.  INTERVENTION:  Tube feeding   Initiate TF via OGT with Vital AF 1.2 at 20 ml/h, increase by 10 ml every 4 hours to goal rate of 60 ml/h to provide 1728 kcals, 108 gm protein, 1168 ml free water daily.  NUTRITION DIAGNOSIS:  Inadequate oral intake related to inability to eat as evidenced by NPO status.   GOAL:  Provide needs based on ASPEN/SCCM guidelines   MONITOR:  TF tolerance, Weight trends, Labs, Vent status  REASON FOR ASSESSMENT:  Consult Enteral/tube feeding initiation and management  ASSESSMENT:  Patient was admitted on 4/26 with SOB. Emergently intubated on 4/27 due to acute respiratory failure.  Per discussion with patient's daughter, patient has had a poor appetite and eaten very poorly since this past weekend. Unsure of his usual weight.   Patient is currently intubated on ventilator support MV: 8.3 L/min Temp (24hrs), Avg:98.1 F (36.7 C), Min:97.3 F (36.3 C), Max:99.4 F (37.4 C)  Propofol: none  Height:  Ht Readings from Last 1 Encounters:  05/30/14 5\' 7"  (1.702 m)    Weight:  Wt Readings from Last 1 Encounters:  05/31/14 147 lb 14.9 oz (67.1 kg)    Ideal Body Weight:  67.3 kg  Wt Readings from Last 10 Encounters:  05/31/14 147 lb 14.9 oz (67.1 kg)  11/20/13 144 lb (65.318 kg)  10/16/13 144 lb (65.318 kg)  04/05/13 144 lb (65.318 kg)  05/19/12 143 lb (64.864 kg)  01/06/12 134 lb (60.782 kg)  03/17/11 145 lb (65.772 kg)  03/10/11 145 lb (65.772 kg)  06/05/10 146 lb 3.2 oz (66.316 kg)  04/09/09 161 lb (73.029 kg)    BMI:  Body mass index is 23.16 kg/(m^2).  Estimated Nutritional Needs:  Kcal:  1677 kcals  Protein:  95-110  gm  Fluid:  1.8 L  Skin:  Reviewed, no issues  Diet Order:   NPO  EDUCATION NEEDS:  No education needs identified at this time   Intake/Output Summary (Last 24 hours) at 05/31/14 1431 Last data filed at 05/31/14 1353  Gross per 24 hour  Intake 1209.26 ml  Output    300 ml  Net 909.26 ml    Last BM:  unknown   Joaquin CourtsKimberly Harris, RD, LDN, CNSC Pager [phone] After Hours Pager [phone]

## 2014-05-31 NOTE — Progress Notes (Signed)
eLink Physician-Brief Progress Note Patient Name: Steven Riddle DOB: 11/19/1955 MRN: [id number]   Date of Service  05/31/2014  HPI/Events of Note  Review of KUB reveals the gastric tube tip to be in the mid stomach.   eICU Interventions  OK to use the gastric tube for enteral nutrition and medication administration.      Intervention Category Intermediate Interventions: Diagnostic test evaluation  Alondria Mousseau Dennard Nipugene 05/31/2014, 12:28 AM

## 2014-05-31 NOTE — Progress Notes (Signed)
ANTICOAGULATION CONSULT NOTE  Pharmacy Consult for Heparin Indication: chest pain/ACS  No Known Allergies  Patient Measurements: Height: 5\' 7"  (170.2 cm) Weight: 147 lb 14.9 oz (67.1 kg) IBW/kg (Calculated) : 66.1 Heparin Dosing Weight: 68 kg  Vital Signs: Temp: 97.8 F (36.6 C) (04/28 1200) Temp Source: Oral (04/28 1200) BP: 119/51 mmHg (04/28 1300) Pulse Rate: 83 (04/28 1300)  Labs:  Recent Labs  05/29/14 1436 05/29/14 2340  05/30/14 0331 05/30/14 1215 05/30/14 2100 05/31/14 0515 05/31/14 1112  HGB 11.6*  --   --  12.2*  --   --  11.0*  --   HCT 35.5*  --   --  37.0*  --   --  34.1*  --   PLT 297  --   --  243  --   --  314  --   LABPROT  --  16.4*  --   --   --   --   --   --   INR  --  1.31  --   --   --   --   --   --   HEPARINUNFRC  --   --   < > 0.14* 0.12* <0.10* 0.42 0.63  CREATININE 1.02  --   --  1.12  --   --  1.11  --   TROPONINI  --  1.03*  --  1.01* 0.96*  --   --   --   < > = values in this interval not displayed.  Estimated Creatinine Clearance: 66.2 mL/min (by C-G formula based on Cr of 1.11).   Assessment: 59yo male with HTN, DM and HLD presents with SOB. Pharmacy is consulted to dose heparin for ACS/chest pain - with small bump in TP 1.  Pt has History of renal transplant on immunosuppresants and now sedated on vent. Heparin drip 1300 uts/hr HL 0.4> 0.6 with no rate change  No bleeding, CBC stable    Goal of Therapy:  Heparin level 0.3-0.7 units/ml Monitor platelets by anticoagulation protocol: Yes   Plan:  Decrease heparin slightly 1200 uts/hr -daily HL and CBC  Leota SauersLisa Olga Bourbeau Pharm.D. CPP, BCPS Clinical Pharmacist [phone] 05/31/2014 4:18 PM

## 2014-05-31 NOTE — Progress Notes (Signed)
Subjective:  Events of yesterday noted. Patient awake intubated denies any complaints.  Objective:  Vital Signs in the last 24 hours: Temp:  [97.3 F (36.3 C)-99.8 F (37.7 C)] 97.9 F (36.6 C) (04/28 0800) Pulse Rate:  [72-109] 85 (04/28 0900) Resp:  [16-18] 16 (04/28 0900) BP: (88-151)/(40-73) 121/55 mmHg (04/28 0900) SpO2:  [93 %-100 %] 99 % (04/28 0900) FiO2 (%):  [60 %-100 %] 60 % (04/28 0800) Weight:  [67.1 kg (147 lb 14.9 oz)] 67.1 kg (147 lb 14.9 oz) (04/28 0354)  Intake/Output from previous day: 04/27 0701 - 04/28 0700 In: 890.6 [I.V.:410.6; NG/GT:30; IV Piggyback:450] Out: 525 [Urine:525] Intake/Output from this shift: Total I/O In: 42.6 [I.V.:42.6] Out: 125 [Urine:125]  Physical Exam: Neck: no adenopathy, no carotid bruit, no JVD and supple, symmetrical, trachea midline Lungs: Bilateral rhonchi and faint rales noted clear anteriorly Heart: regular rate and rhythm, S1, S2 normal and Soft systolic murmur and S3 gallop noted Abdomen: soft, non-tender; bowel sounds normal; no masses,  no organomegaly Extremities: No clubbing cyanosis 1+ edema noted  Lab Results:  Recent Labs  05/30/14 0331 05/31/14 0515  WBC 16.9* 12.7*  HGB 12.2* 11.0*  PLT 243 314    Recent Labs  05/30/14 0331 05/31/14 0515  NA 138 143  K 3.8 3.9  CL 106 108  CO2 20 25  GLUCOSE 178* 198*  BUN 29* 27*  CREATININE 1.12 1.11    Recent Labs  05/30/14 0331 05/30/14 1215  TROPONINI 1.01* 0.96*   Hepatic Function Panel  Recent Labs  05/29/14 2340  PROT 7.2  ALBUMIN 2.7*  AST 31  ALT 23  ALKPHOS 132*  BILITOT 1.1  BILIDIR 0.6*  IBILI 0.5    Recent Labs  05/30/14 0331  CHOL 155   No results for input(s): PROTIME in the last 72 hours.  Imaging: Imaging results have been reviewed and Dg Chest Port 1 View  05/31/2014   CLINICAL DATA:  Intubated patient.  Hypoxia.  EXAM: PORTABLE CHEST - 1 VIEW  COMPARISON:  Single view of the chest 05/30/2014 and 05/29/2014.   FINDINGS: Endotracheal tube, endotracheal tube and left IJ catheter remain in place. A new NG tube is in place with the tip in the stomach. Right worse than left airspace disease persists but appears mildly improved. Heart size is normal. No pneumothorax.  IMPRESSION: NG tube tip is in the stomach.  Extensive right worse than left airspace disease show some improvement since yesterday's exam.   Electronically Signed   By: Drusilla Kannerhomas  Dalessio M.D.   On: 05/31/2014 07:21   Dg Chest Port 1 View  05/30/2014   CLINICAL DATA:  Hypoxia  EXAM: PORTABLE CHEST - 1 VIEW  COMPARISON:  May 29, 2014  FINDINGS: Endotracheal tube tip is 3.5 cm above the carina. Central catheter tip is at the cavoatrial junction. No pneumothorax. There has been progression of airspace consolidation bilaterally with extensive airspace consolidation throughout both mid and lower lung zones as well as in the right upper lobe. Heart is mildly enlarged with pulmonary vascularity within normal limits. No adenopathy.  IMPRESSION: Tube and catheter positions as described without pneumothorax. Progression of airspace consolidation bilaterally, more on the right than on the left. No change in cardiac silhouette.   Electronically Signed   By: Bretta BangWilliam  Woodruff III M.D.   On: 05/30/2014 14:02   Dg Chest Portable 1 View  05/29/2014   CLINICAL DATA:  Shortness of breath for a few days. Hypertension and diabetes  EXAM: PORTABLE CHEST -  1 VIEW  COMPARISON:  01/09/1999  FINDINGS: Dense patchy bilateral airspace disease, asymmetric to the right. There is mild cardiomegaly without interstitial edema or pleural effusion. Negative aortic and hilar contours.  IMPRESSION: Bilateral airspace disease consistent with pneumonia.   Electronically Signed   By: Marnee Spring M.D.   On: 05/29/2014 14:41   Dg Abd Portable 1v  05/30/2014   CLINICAL DATA:  59 year old male with recent orogastric tube placement  EXAM: PORTABLE ABDOMEN - 1 VIEW  COMPARISON:  Chest x-ray  obtained earlier tonight  FINDINGS: The tip of an orogastric tube projects over the stomach. Incompletely imaged central venous catheter with the tip overlying the mid right atrium. The bowel gas pattern is not obstructed. Diffuse bilateral interstitial and airspace opacities in the visualized lower lungs.  IMPRESSION: The tip of the orogastric tube projects over the stomach.   Electronically Signed   By: Malachy Moan M.D.   On: 05/30/2014 23:35    Cardiac Studies:  Assessment/Plan:  Bilateral pneumonia Acute respiratory failure secondary to above Possible small non-Q-wave myocardial infarction Coronary artery disease history of possible silent anteroseptal wall MI in the past Mild decompensated systolic heart failure improved Hypertension Insulin requiring diabetes mellitus Peripheral neuropathy Anemia of chronic disease Depression Anxiety disorder End-stage renal disease status post renal transplant on chronic immunosuppressive therapy Plan Continue present management per CCM Check 2-D echo Check one more set of troponin I if trending down  will DC heparin Will schedule for left cath possible PTCA stenting once he is extubated and stable from pulmonary point of view early next week.. Discussed with family and agree  LOS: 2 days    Kimiah Hibner N 05/31/2014, 10:06 AM

## 2014-05-31 NOTE — Progress Notes (Signed)
ANTICOAGULATION CONSULT NOTE - Follow Up Consult  Pharmacy Consult for heparin Indication: chest pain/ACS  Labs:  Recent Labs  05/29/14 1436 05/29/14 2340  05/30/14 0331 05/30/14 1215 05/30/14 2100 05/31/14 0515  HGB 11.6*  --   --  12.2*  --   --  11.0*  HCT 35.5*  --   --  37.0*  --   --  34.1*  PLT 297  --   --  243  --   --  314  LABPROT  --  16.4*  --   --   --   --   --   INR  --  1.31  --   --   --   --   --   HEPARINUNFRC  --   --   < > 0.14* 0.12* <0.10* 0.42  CREATININE 1.02  --   --  1.12  --   --  1.11  TROPONINI  --  1.03*  --  1.01* 0.96*  --   --   < > = values in this interval not displayed.    Assessment/Plan:  59yo male therapeutic on heparin after rate increase. Will continue gtt at current rate and confirm stable with additional level.   Vernard GamblesVeronda Nyala Kirchner, PharmD, BCPS  05/31/2014,6:29 AM

## 2014-05-31 NOTE — Progress Notes (Signed)
PULMONARY / CRITICAL CARE MEDICINE   Name: Steven Riddle MRN: [id number] DOB: [birth year]    ADMISSION DATE:  05/29/2014 CONSULTATION DATE:  4/27  REFERRING MD : Triad  CHIEF COMPLAINT:  SOB  INITIAL PRESENTATION: SOB  STUDIES:  4/26 CXR: Bilateral airspace disease consistent with pneumonia 4/27 CXR: Progression of airspace consolidation bilaterally, more on the right than on the left. 4/27 Abd XR: tip of gastric tube in stomach 4/27 Bronchoscopy : Purulent broncho sample sent for analysis 4/28 CXR: Extensive right worse than left airspace disease show some improvement since yesterday's exam   SIGNIFICANT EVENTS: 4/27 Emergently intubated due to acute respiratory failure   Brief HPI: Steven Riddle is a  59 yo [place] male, renal tranpslant in 2013 in [place], who presents on 4/26 from Endocrinologist office with O2 sats 78% on room air. Notes one week of cough, white sputum, lower ext edema, with out FCS or chest pain. Noted to have + tropin's without acute EKG changes but he has Rt BBB. No prior cardiac history. CxR on admit 4/26 with BASDZ and he's been on nimvs >24 hours and remains on 100% and unable to come off. I suspect he needs intubation, sputum culture , possible FOB, move to ICU. If cards wants  to cath they can do while intubated and airway secured.   SUBJECTIVE:  Afebrile sedated  VITAL SIGNS: Temp:  [97.3 F (36.3 C)-99.8 F (37.7 C)] 97.9 F (36.6 C) (04/28 0800) Pulse Rate:  [72-109] 85 (04/28 0900) Resp:  [16-18] 16 (04/28 0900) BP: (88-151)/(40-73) 121/55 mmHg (04/28 0900) SpO2:  [93 %-100 %] 99 % (04/28 0900) FiO2 (%):  [60 %-100 %] 60 % (04/28 0800) Weight:  [147 lb 14.9 oz (67.1 kg)] 147 lb 14.9 oz (67.1 kg) (04/28 0354) HEMODYNAMICS: CVP:  [6 mmHg-9 mmHg] 8 mmHg VENTILATOR SETTINGS: Vent Mode:  [-] PRVC FiO2 (%):  [60 %-100 %] 60 % Set Rate:  [16 bmp] 16 bmp Vt Set:  [530 mL] 530 mL PEEP:  [5 cmH20] 5 cmH20 Plateau Pressure:  [21 cmH20-24  cmH20] 23 cmH20 INTAKE / OUTPUT:  Intake/Output Summary (Last 24 hours) at 05/31/14 1006 Last data filed at 05/31/14 0900  Gross per 24 hour  Intake 810.41 ml  Output    500 ml  Net 310.41 ml    PHYSICAL EXAMINATION: General:  Sedated, ill, in NAD Neuro:  Sedated, withdraws to pain HEENT: OG ETT in place Cardiovascular: s1, s2, regular Lungs:  No wheezing, mechanical breath sounds Abdomen: Soft + bs Musculoskeletal: Intact Skin:  Warm and dry, no rash noted  LABS:  CBC  Recent Labs Lab 05/29/14 1436 05/30/14 0331 05/31/14 0515  WBC 12.5* 16.9* 12.7*  HGB 11.6* 12.2* 11.0*  HCT 35.5* 37.0* 34.1*  PLT 297 243 314   Coag's  Recent Labs Lab 05/29/14 2340  INR 1.31   BMET  Recent Labs Lab 05/29/14 1436 05/30/14 0331 05/31/14 0515  NA 139 138 143  K 4.2 3.8 3.9  CL 109 106 108  CO2 BUN 27* 29* 27*  CREATININE 1.02 1.12 1.11  GLUCOSE 211* 178* 198*   Electrolytes  Recent Labs Lab 05/29/14 1436 05/30/14 0331 05/31/14 0515  CALCIUM 8.1* 8.7 8.6  MG  --   --  1.5  PHOS  --   --  4.0   Sepsis Markers  Recent Labs Lab 05/29/14 1443 05/29/14 1939  LATICACIDVEN 1.14 1.31   ABG  Recent Labs Lab 05/29/14 1532 05/30/14 1523 05/31/14 0405  PHART 7.363 7.402 7.419  PCO2ART 33.8* 39.3 33.2*  PO2ART 66.0* 80.0 82.0   Liver Enzymes  Recent Labs Lab 05/29/14 2340  AST 31  ALT 23  ALKPHOS 132*  BILITOT 1.1  ALBUMIN 2.7*   Cardiac Enzymes  Recent Labs Lab 05/29/14 2340 05/30/14 0331 05/30/14 1215  TROPONINI 1.03* 1.01* 0.96*   Glucose  Recent Labs Lab 05/30/14 1739 05/30/14 2012 05/30/14 2043 05/31/14 0002 05/31/14 0404 05/31/14 0856  GLUCAP 75 53* 176* 145* 154* 211*    Imaging Dg Chest Port 1 View  05/30/2014   CLINICAL DATA:  Hypoxia  EXAM: PORTABLE CHEST - 1 VIEW  COMPARISON:  May 29, 2014  FINDINGS: Endotracheal tube tip is 3.5 cm above the carina. Central catheter tip is at the cavoatrial junction. No  pneumothorax. There has been progression of airspace consolidation bilaterally with extensive airspace consolidation throughout both mid and lower lung zones as well as in the right upper lobe. Heart is mildly enlarged with pulmonary vascularity within normal limits. No adenopathy.  IMPRESSION: Tube and catheter positions as described without pneumothorax. Progression of airspace consolidation bilaterally, more on the right than on the left. No change in cardiac silhouette.   Electronically Signed   By: Bretta Bang III M.D.   On: 05/30/2014 14:02   Dg Abd Portable 1v  05/30/2014   CLINICAL DATA:  59 year old male with recent orogastric tube placement  EXAM: PORTABLE ABDOMEN - 1 VIEW  COMPARISON:  Chest x-ray obtained earlier tonight  FINDINGS: The tip of an orogastric tube projects over the stomach. Incompletely imaged central venous catheter with the tip overlying the mid right atrium. The bowel gas pattern is not obstructed. Diffuse bilateral interstitial and airspace opacities in the visualized lower lungs.  IMPRESSION: The tip of the orogastric tube projects over the stomach.   Electronically Signed   By: Malachy Moan M.D.   On: 05/30/2014 23:35     ASSESSMENT / PLAN:  PULMONARY OETT 4/27>> A: BASDZ in renal transplant pt, on steroids, failed NIMVS, + trop P:   Bronch culture 4/27>>> Sputum cx 4/27>> Vent bundle See ID DuoNebs, steroids Will add PCP PNA coverage with dapsone (avoid bactrim with renal transplant)  CARDIOVASCULAR CVL, Left IJ, 4/27 >> A:  Acute on chronic systolic heart failure HTN  RBBB ?silent anteroseptal wall MI in the past Elevated troponin - 1.03>1.01>0.96  P:  Heparin per cards Cardiac cath per cards after extubation Check troponin x1 Check 2D echo, pending Diureses  RENAL A:  Renal transplant 2013 Mg borderline P:   Renal consult Careful with dye if CC performed Mg repletion 2g x1 Lasix drip at 5 mg/hr x24 hours. Zaroxolyn 5 mg PO  x1. Potassium as ordered.  GASTROINTESTINAL A:  Recent Nausea P:   Phenergan PRN Start TF per nutrition today   HEMATOLOGIC A:  Chronic immunosuppression Leucocytosis -  P:  Follow labs Continue immunosuppressants  INFECTIOUS A: BL PNA Lactic acid 1.31 P:   BCx 24/26>> UC 4/26>> Sputum 4/27>> Broncho cx 4/27>> Pneumocystis smear 4/27>> Abx:  4/27 cefepime>> 4/27 diflucan(was on as opt)>> 4/27 vanc>> 4/28 Dapsone>>  ENDOCRINE A:   DM  P:   SSI Levemir 15 unit daily  NEUROLOGIC A:  Sedated while on vent P:   RASS goal: -1 Fentanyl drip + Precedex  FAMILY  - Updates: 4/27 Wife at bedside.   - Inter-disciplinary family meet or Palliative Care meeting due by:  day 7  SUMMARY: Steven Riddle is a  59 yo  [place], [place] in [place], who presented on 4/26 from Endocrinologist office with O2 sats 78% on room air with one week of cough, white sputum, lower ext edema, with out FCS or chest pain. Noted to have + tropin's without acute EKG changes but RBBB. No prior cardiac history.CXR on admit 4/26 with BASDZ and he's been on nimvs >24 hours and remains on 100% and unable to come off. Emergently intubated on 4/27 due to acute respiratory failure, moved to ICU. Will continue Abx. Cardiology following, plans to do cath after he is extubated.   Steven BarbanKennerly, Solianny D, MD IMTS, PGY3 05/31/2014, 10:23 AM  Above note updated in full.  Patient stable on vent, diffuse crackles remain, continues to be on FiO2 of 60%, will actively diurese patient today.  Treat as if PCP and will f/u on culture.  Family updated bedside.  The patient is critically ill with multiple organ systems failure and requires high complexity decision making for assessment and support, frequent evaluation and titration of therapies, application of advanced monitoring technologies and extensive interpretation of multiple databases.   Critical Care Time devoted to patient care services  described in this note is  35  Minutes. This time reflects time of care of this signee Dr Koren BoundWesam Sayra Frisby. This critical care time does not reflect procedure time, or teaching time or supervisory time of PA/NP/Med student/Med Resident etc but could involve care discussion time.  Alyson ReedyWesam G. Emoni Yang, M.D. [facility]. Pager: [phone]. After hours pager: [phone].

## 2014-05-31 NOTE — Progress Notes (Signed)
  Echocardiogram 2D Echocardiogram has been performed.  Steven Riddle, Steven Riddle 05/31/2014, 10:55 AM

## 2014-06-01 ENCOUNTER — Inpatient Hospital Stay (HOSPITAL_COMMUNITY): Payer: PPO

## 2014-06-01 LAB — BASIC METABOLIC PANEL
ANION GAP: 12 (ref 5–15)
ANION GAP: 13 (ref 5–15)
BUN: 42 mg/dL — AB (ref 6–23)
BUN: 54 mg/dL — AB (ref 6–23)
CALCIUM: 8.6 mg/dL (ref 8.4–10.5)
CHLORIDE: 104 mmol/L (ref 96–112)
CHLORIDE: 102 mmol/L (ref 96–112)
CO2: 27 mmol/L (ref 19–32)
CO2: 27 mmol/L (ref 19–32)
Calcium: 9 mg/dL (ref 8.4–10.5)
Creatinine, Ser: 1.35 mg/dL (ref 0.50–1.35)
Creatinine, Ser: 1.41 mg/dL — ABNORMAL HIGH (ref 0.50–1.35)
GFR calc Af Amer: 64 mL/min — ABNORMAL LOW (ref >90)
GFR, EST AFRICAN AMERICAN: 61 mL/min — AB (ref >90)
GFR, EST NON AFRICAN AMERICAN: 56 mL/min — AB (ref >90)
GFR, EST NON AFRICAN AMERICAN: 53 mL/min — AB (ref >90)
GLUCOSE: 146 mg/dL — AB (ref 70–99)
Glucose, Bld: 281 mg/dL — ABNORMAL HIGH (ref 70–99)
POTASSIUM: 2.9 mmol/L — AB (ref 3.5–5.1)
POTASSIUM: 3.1 mmol/L — AB (ref 3.5–5.1)
SODIUM: 143 mmol/L (ref 135–145)
SODIUM: 142 mmol/L (ref 135–145)

## 2014-06-01 LAB — GLUCOSE, CAPILLARY
GLUCOSE-CAPILLARY: 189 mg/dL — AB (ref 70–99)
GLUCOSE-CAPILLARY: 215 mg/dL — AB (ref 70–99)
GLUCOSE-CAPILLARY: 256 mg/dL — AB (ref 70–99)
GLUCOSE-CAPILLARY: 262 mg/dL — AB (ref 70–99)
GLUCOSE-CAPILLARY: 341 mg/dL — AB (ref 70–99)
GLUCOSE-CAPILLARY: 397 mg/dL — AB (ref 70–99)
Glucose-Capillary: 157 mg/dL — ABNORMAL HIGH (ref 70–99)

## 2014-06-01 LAB — CBC
HCT: 37.3 % — ABNORMAL LOW (ref 39.0–52.0)
Hemoglobin: 12.4 g/dL — ABNORMAL LOW (ref 13.0–17.0)
MCH: 28.4 pg (ref 26.0–34.0)
MCHC: 33.2 g/dL (ref 30.0–36.0)
MCV: 85.6 fL (ref 78.0–100.0)
Platelets: 372 10*3/uL (ref 150–400)
RBC: 4.36 MIL/uL (ref 4.22–5.81)
RDW: 14.4 % (ref 11.5–15.5)
WBC: 17.5 10*3/uL — AB (ref 4.0–10.5)

## 2014-06-01 LAB — VANCOMYCIN, TROUGH: Vancomycin Tr: 25.9 ug/mL (ref 10.0–20.0)

## 2014-06-01 LAB — CULTURE, BAL-QUANTITATIVE
CULTURE: NO GROWTH
Colony Count: NO GROWTH

## 2014-06-01 LAB — BLOOD GAS, ARTERIAL
Acid-Base Excess: 2.5 mmol/L — ABNORMAL HIGH (ref 0.0–2.0)
Bicarbonate: 25.5 mEq/L — ABNORMAL HIGH (ref 20.0–24.0)
Drawn by: 270271
FIO2: 0.6 %
LHR: 16 {breaths}/min
O2 Saturation: 96.2 %
PATIENT TEMPERATURE: 97.6
PEEP: 5 cmH2O
PH ART: 7.509 — AB (ref 7.350–7.450)
TCO2: 26.5 mmol/L (ref 0–100)
VT: 530 mL
pCO2 arterial: 32.1 mmHg — ABNORMAL LOW (ref 35.0–45.0)
pO2, Arterial: 86.9 mmHg (ref 80.0–100.0)

## 2014-06-01 LAB — CULTURE, BAL-QUANTITATIVE W GRAM STAIN
Colony Count: NO GROWTH
Culture: NO GROWTH

## 2014-06-01 LAB — BRAIN NATRIURETIC PEPTIDE: B Natriuretic Peptide: 872.3 pg/mL — ABNORMAL HIGH (ref 0.0–100.0)

## 2014-06-01 LAB — MRSA PCR SCREENING: MRSA by PCR: NEGATIVE

## 2014-06-01 LAB — MAGNESIUM: Magnesium: 1.8 mg/dL (ref 1.5–2.5)

## 2014-06-01 LAB — PHOSPHORUS: PHOSPHORUS: 3.6 mg/dL (ref 2.3–4.6)

## 2014-06-01 LAB — HEPARIN LEVEL (UNFRACTIONATED): HEPARIN UNFRACTIONATED: 0.42 [IU]/mL (ref 0.30–0.70)

## 2014-06-01 LAB — TROPONIN I: Troponin I: 0.83 ng/mL (ref <0.031)

## 2014-06-01 MED ORDER — SODIUM CHLORIDE 0.9 % IV SOLN
INTRAVENOUS | Status: DC
  Administered 2014-06-03: 03:00:00 via INTRAVENOUS

## 2014-06-01 MED ORDER — ATORVASTATIN CALCIUM 40 MG PO TABS
40.0000 mg | ORAL_TABLET | Freq: Every day | ORAL | Status: DC
  Administered 2014-06-01 – 2014-06-06 (×6): 40 mg via ORAL
  Filled 2014-06-01 (×7): qty 1

## 2014-06-01 MED ORDER — CARVEDILOL 3.125 MG PO TABS
3.1250 mg | ORAL_TABLET | Freq: Two times a day (BID) | ORAL | Status: DC
  Administered 2014-06-01 – 2014-06-08 (×13): 3.125 mg via ORAL
  Filled 2014-06-01 (×15): qty 1

## 2014-06-01 MED ORDER — CETYLPYRIDINIUM CHLORIDE 0.05 % MT LIQD
7.0000 mL | Freq: Two times a day (BID) | OROMUCOSAL | Status: DC
  Administered 2014-06-01 – 2014-06-02 (×3): 7 mL via OROMUCOSAL

## 2014-06-01 MED ORDER — VANCOMYCIN HCL 500 MG IV SOLR
500.0000 mg | Freq: Two times a day (BID) | INTRAVENOUS | Status: DC
  Filled 2014-06-01: qty 500

## 2014-06-01 MED ORDER — MAGNESIUM SULFATE 2 GM/50ML IV SOLN
2.0000 g | Freq: Once | INTRAVENOUS | Status: AC
  Administered 2014-06-01: 2 g via INTRAVENOUS
  Filled 2014-06-01: qty 50

## 2014-06-01 MED ORDER — CHLORHEXIDINE GLUCONATE 0.12 % MT SOLN
15.0000 mL | Freq: Two times a day (BID) | OROMUCOSAL | Status: DC
  Administered 2014-06-01 – 2014-06-03 (×2): 15 mL via OROMUCOSAL
  Filled 2014-06-01: qty 15

## 2014-06-01 MED ORDER — IPRATROPIUM-ALBUTEROL 0.5-2.5 (3) MG/3ML IN SOLN: 3.0000 mL | RESPIRATORY_TRACT | Status: DC | PRN

## 2014-06-01 MED ORDER — POTASSIUM CHLORIDE 20 MEQ/15ML (10%) PO SOLN
40.0000 meq | ORAL | Status: AC
  Administered 2014-06-01 (×2): 40 meq
  Filled 2014-06-01 (×2): qty 30

## 2014-06-01 MED ORDER — METOLAZONE 5 MG PO TABS
5.0000 mg | ORAL_TABLET | Freq: Every day | ORAL | Status: AC
  Administered 2014-06-01: 5 mg via ORAL
  Filled 2014-06-01: qty 1

## 2014-06-01 MED ORDER — FUROSEMIDE 10 MG/ML IJ SOLN
40.0000 mg | Freq: Three times a day (TID) | INTRAMUSCULAR | Status: AC
  Administered 2014-06-01 (×2): 40 mg via INTRAVENOUS
  Filled 2014-06-01 (×2): qty 4

## 2014-06-01 MED ORDER — CLOPIDOGREL BISULFATE 75 MG PO TABS
75.0000 mg | ORAL_TABLET | Freq: Every day | ORAL | Status: DC
  Administered 2014-06-01 – 2014-06-04 (×4): 75 mg via ORAL
  Filled 2014-06-01 (×4): qty 1

## 2014-06-01 NOTE — Progress Notes (Signed)
Inpatient Diabetes Program Recommendations  AACE/ADA: New Consensus Statement on Inpatient Glycemic Control (2013)  Target Ranges:  Prepandial:   less than 140 mg/dL      Peak postprandial:   less than 180 mg/dL (1-2 hours)      Critically ill patients:  140 - 180 mg/dL   Results for Steven Riddle, Steven Riddle (MRN [id number]) as of 06/01/2014 10:38  Ref. Range 05/31/2014 16:32 05/31/2014 20:50 06/01/2014 00:03 06/01/2014 04:10 06/01/2014 07:39  Glucose-Capillary Latest Ref Range: 70-99 mg/dL 409255 (H) 811189 (H) 914256 (H) 262 (H) 341 (H)    Reason for assessment: elevated CBG  Diabetes history: Type 2 Outpatient Diabetes medications: Levemir 15-20 units bid Current orders for Inpatient glycemic control: Levemir 15 units q day, Novolog moderate correction q4h  Elevated fasting blood sugar, please consider increasing Levemir to 15 units bid (note he takes 15-20 units bid at home- A1C 12.1% on 05/29/14).  Susette RacerJulie Tyquisha Sharps, RN, BA, MHA, CDE Diabetes Coordinator Inpatient Diabetes Program  [phone] (Team Pager) [phone] Patrcia Dolly([place] Office) 06/01/2014 10:38 AM

## 2014-06-01 NOTE — Progress Notes (Signed)
Subjective:  Patient denies any chest pain states breathing has improved successfully extubated earlier this morning  Objective:  Vital Signs in the last 24 hours: Temp:  [97.5 F (36.4 C)-98.4 F (36.9 C)] 98.4 F (36.9 C) (04/29 0800) Pulse Rate:  [77-96] 93 (04/29 1100) Resp:  [14-27] 19 (04/29 1100) BP: (113-141)/(47-61) 118/50 mmHg (04/29 1100) SpO2:  [94 %-100 %] 96 % (04/29 1100) FiO2 (%):  [40 %-60 %] 40 % (04/29 0818) Weight:  [65.8 kg (145 lb 1 oz)] 65.8 kg (145 lb 1 oz) (04/29 0435)  Intake/Output from previous day: 04/28 0701 - 04/29 0700 In: 1865.7 [I.V.:630.7; NG/GT:635; IV Piggyback:600] Out: 3975 [Urine:3975] Intake/Output from this shift: Total I/O In: 188.1 [I.V.:88.1; NG/GT:100] Out: 1550 [Urine:1550]  Physical Exam: Neck: no adenopathy, no carotid bruit, no JVD and supple, symmetrical, trachea midline Lungs: Bibasilar Rales noted with occasional rhonchi Heart: regular rate and rhythm, S1, S2 normal and Soft systolic murmur and S3 gallop noted Abdomen: soft, non-tender; bowel sounds normal; no masses,  no organomegaly Extremities: extremities normal, atraumatic, no cyanosis or edema  Lab Results:  Recent Labs  05/31/14 0515 06/01/14 0400  WBC 12.7* 17.5*  HGB 11.0* 12.4*  PLT 314 372    Recent Labs  05/31/14 0515 06/01/14 0400  NA 143 143  K 3.9 2.9*  CL 108 104  CO2 25 27  GLUCOSE 198* 281*  BUN 27* 42*  CREATININE 1.11 1.35    Recent Labs  05/30/14 1215 06/01/14 0400  TROPONINI 0.96* 0.83*   Hepatic Function Panel  Recent Labs  05/29/14 2340  PROT 7.2  ALBUMIN 2.7*  AST 31  ALT 23  ALKPHOS 132*  BILITOT 1.1  BILIDIR 0.6*  IBILI 0.5    Recent Labs  05/30/14 0331  CHOL 155   No results for input(s): PROTIME in the last 72 hours.  Imaging: Imaging results have been reviewed and Dg Chest Port 1 View  06/01/2014   CLINICAL DATA:  Intubation.  EXAM: PORTABLE CHEST - 1 VIEW  COMPARISON:  05/31/2014.  FINDINGS:  Endotracheal tube, left IJ line, NG tube in good anatomic position. Heart size stable. Persistent but improving bilateral pulmonary infiltrates. No pleural effusion or pneumothorax. No acute osseous abnormality.  IMPRESSION: 1. Lines and tubes in stable position. 2. Persistent but improving bilateral pulmonary infiltrates.   Electronically Signed   By: Maisie Fus  Register   On: 06/01/2014 07:16   Dg Chest Port 1 View  05/31/2014   CLINICAL DATA:  Intubated patient.  Hypoxia.  EXAM: PORTABLE CHEST - 1 VIEW  COMPARISON:  Single view of the chest 05/30/2014 and 05/29/2014.  FINDINGS: Endotracheal tube, endotracheal tube and left IJ catheter remain in place. A new NG tube is in place with the tip in the stomach. Right worse than left airspace disease persists but appears mildly improved. Heart size is normal. No pneumothorax.  IMPRESSION: NG tube tip is in the stomach.  Extensive right worse than left airspace disease show some improvement since yesterday's exam.   Electronically Signed   By: Drusilla Kanner M.D.   On: 05/31/2014 07:21   Dg Chest Port 1 View  05/30/2014   CLINICAL DATA:  Hypoxia  EXAM: PORTABLE CHEST - 1 VIEW  COMPARISON:  May 29, 2014  FINDINGS: Endotracheal tube tip is 3.5 cm above the carina. Central catheter tip is at the cavoatrial junction. No pneumothorax. There has been progression of airspace consolidation bilaterally with extensive airspace consolidation throughout both mid and lower lung zones as  well as in the right upper lobe. Heart is mildly enlarged with pulmonary vascularity within normal limits. No adenopathy.  IMPRESSION: Tube and catheter positions as described without pneumothorax. Progression of airspace consolidation bilaterally, more on the right than on the left. No change in cardiac silhouette.   Electronically Signed   By: Bretta BangWilliam  Woodruff III M.D.   On: 05/30/2014 14:02   Dg Abd Portable 1v  05/30/2014   CLINICAL DATA:  59 year old male with recent orogastric tube  placement  EXAM: PORTABLE ABDOMEN - 1 VIEW  COMPARISON:  Chest x-ray obtained earlier tonight  FINDINGS: The tip of an orogastric tube projects over the stomach. Incompletely imaged central venous catheter with the tip overlying the mid right atrium. The bowel gas pattern is not obstructed. Diffuse bilateral interstitial and airspace opacities in the visualized lower lungs.  IMPRESSION: The tip of the orogastric tube projects over the stomach.   Electronically Signed   By: Malachy MoanHeath  McCullough M.D.   On: 05/30/2014 23:35    Cardiac Studies:  Assessment/Plan:  Resolving Bilateral pneumonia Status post Acute respiratory failure secondary to above Possible small non-Q-wave myocardial infarction Coronary artery disease history of possible silent anteroseptal wall MI in the past Resolving  decompensated systolic heart failure improved Hypertension Insulin requiring diabetes mellitus Peripheral neuropathy Anemia of chronic disease Depression Anxiety disorder End-stage renal disease status post renal transplant on chronic immunosuppressive therapy  Plan Continue present management Discussed briefly with patient and family regarding left cardiac cath possible PTCA stenting its risk and benefits, will discuss further and decide Will schedule tentatively for Monday.   LOS: 3 days    Caylin Nass N 06/01/2014, 12:14 PM

## 2014-06-01 NOTE — Progress Notes (Addendum)
ANTIBIOTIC CONSULT NOTE - FOLLOW UP  Pharmacy Consult for Vancomycin  Indication: rule out pneumonia and rule out sepsis  No Known Allergies  Labs:  Recent Labs  05/30/14 0331 05/31/14 0515 06/01/14 0400 06/01/14 2012  WBC 16.9* 12.7* 17.5*  --   HGB 12.2* 11.0* 12.4*  --   PLT 243 314 372  --   CREATININE 1.12 1.11 1.35 1.41*   Estimated Creatinine Clearance: 51.9 mL/min (by C-G formula based on Cr of 1.41).  Recent Labs  06/01/14 2200  VANCOTROUGH 25.9*    Assessment: Supra-therapeutic vancomycin trough (drawn correctly)  Goal of Therapy:  Vancomycin trough level 15-20 mcg/ml  Plan:  -Reduce vancomycin to 500 mg IV q12h, start in AM -Re-check vancomycin trough at steady state -Will check random vancomycin in AM to ensure level going down as expected  Abran DukeLedford, Cordelro Gautreau 06/01/2014,11:37 PM  =================================== Addendum  SCr is rising, vancomycin level remains elevated -Hold vancomycin for now -Re-check random vancomycin level 5/1 AM Wilmer FloorJLedford, PharmD

## 2014-06-01 NOTE — Procedures (Signed)
Extubation Procedure Note  Patient Details:   Name: Steven Riddle DOB: 1955/02/25 MRN: [id number]   Airway Documentation:     Evaluation  O2 sats: stable throughout Complications: No apparent complications Patient did tolerate procedure well. Bilateral Breath Sounds: Clear Suctioning: Airway Yes  Patient tolerated wean. MD ordered to extubate. Positive for cuff leak. Patient extubated to a 4 Lpm nasal cannula. No signs of dyspnea or stridor. Patient instructed on the Incentive Spirometer, achieving 750 three times. RN at bedside.   Ancil BoozerSmallwood, Lora Chavers 06/01/2014, 9:39 AM

## 2014-06-01 NOTE — Care Management Note (Addendum)
° ° °  Page 1 of 2   06/07/2014     4:07:28 PM CARE MANAGEMENT NOTE 06/07/2014  Patient:  Steven Riddle,Steven Riddle   Account Number:  [id number]  Date Initiated:  05/30/2014  Documentation initiated by:  COLE,ANGELA  Subjective/Objective Assessment:   Pt from home admitted with sob.     Action/Plan:   Return to home when medically stable. CM to f/u with d/c needs.  lives w wife, pcp dr Romero Bellingsean ellison   Anticipated DC Date:  06/06/2014   Anticipated DC Plan:  HOME W HOME HEALTH SERVICES      DC Planning Services  CM consult      PAC Choice  DURABLE MEDICAL EQUIPMENT  HOME HEALTH   Choice offered to / List presented to:  C-3 Spouse   DME arranged  [facility]      DME agency  [facility]     [facility]  HH-1 RN  HH-10 DISEASE MANAGEMENT  HH-2 PT      [facility]  [facility]   Status of service:  In process, will continue to follow Medicare Important Message given?  YES (If response is NO, the following Medicare IM given date fields will be blank) Date Medicare IM given:  06/01/2014 Medicare IM given by:  Junius CreamerWELL,DEBBIE Date Additional Medicare IM given:  06/04/2014 Additional Medicare IM given by:  Junius CreamerEBBIE DOWELL  [facility]  [facility]  Per UR Regulation:  Reviewed for med. necessity/level of care/duration of stay  If discussed at Long Length of Stay Meetings, dates discussed:    Comments:   06-07-14 [address]04 Tomi BambergerBrenda Graves-Bigelow, RN,BSN [phone] Benefits check received and co pay will be 20% of actual cos of med, no prior authorization needed. Please provide co pay card as well if this plan is not a medicare plan. Thanks  06-07-14 [address] Annalaura Sauseda Graves-Bigelow, [place]N [phone] CM did a benefits check for effient- in EPIC. CM Please check EPIC in basket for cost if not listed in note. Staff RN to provide effient 30 day free card.  05/30/2014 @ 10:30 Gae Gallopngela Cole RN,BSN,CM [phone]         emergency contact -  [address] , [phone] 5/3 0933 debbie dowell rn,bsn spoke w wife debbie. she would like hhc and rolator for pt. states bp low when stands. she does not want any snf and wants to get pt home. hopes for dc on 5-4 if bp ok. went over hhc agency list and no pref. ref to donna w [facility] for hhrn and hhpt. have left vm for jermaine for rolator.   4/27 1413 debbie dowell rn,bsn may need hhc. will moniter for dc needs as pt progresses.

## 2014-06-01 NOTE — Progress Notes (Signed)
PULMONARY / CRITICAL CARE MEDICINE   Name: Steven Riddle MRN: [id number] DOB: May 10, 1955    ADMISSION DATE:  05/29/2014 CONSULTATION DATE:  4/27  REFERRING MD : [facility]  CHIEF COMPLAINT:  SOB  INITIAL PRESENTATION: SOB  STUDIES:  4/26 CXR: Bilateral airspace disease consistent with pneumonia 4/27 CXR: Progression of airspace consolidation bilaterally, more on the right than on the left. 4/27 Abd XR: tip of gastric tube in stomach 4/27 Bronchoscopy : Purulent broncho sample sent for analysis 4/28 CXR: Extensive right worse than left airspace disease show some improvement since yesterday's exam   SIGNIFICANT EVENTS: 4/27 Emergently intubated due to acute respiratory failure   Brief HPI: Steven Riddle is a  59 yo [place] male, renal tranpslant in 2013 in [place], who presents on 4/26 from Endocrinologist office with O2 sats 78% on room air. Notes one week of cough, white sputum, lower ext edema, with out FCS or chest pain. Noted to have + tropin's without acute EKG changes but he has Rt BBB. No prior cardiac history. CxR on admit 4/26 with BASDZ and he's been on nimvs >24 hours and remains on 100% and unable to come off. I suspect he needs intubation, sputum culture , possible FOB, move to ICU. If cards wants  to cath they can do while intubated and airway secured.   SUBJECTIVE:  Afebrile sedated  VITAL SIGNS: Temp:  [97.5 F (36.4 C)-98.4 F (36.9 C)] 98.4 F (36.9 C) (04/29 0800) Pulse Rate:  [77-94] 94 (04/29 0900) Resp:  [14-27] 20 (04/29 0900) BP: (113-141)/(47-61) 113/51 mmHg (04/29 0900) SpO2:  [94 %-100 %] 99 % (04/29 0900) FiO2 (%):  [40 %-60 %] 40 % (04/29 0818) Weight:  [65.8 kg (145 lb 1 oz)] 65.8 kg (145 lb 1 oz) (04/29 0435) HEMODYNAMICS: CVP:  [7 mmHg-11 mmHg] 7 mmHg VENTILATOR SETTINGS: Vent Mode:  [-] PSV;CPAP FiO2 (%):  [40 %-60 %] 40 % Set Rate:  [16 bmp] 16 bmp Vt Set:  [530 mL] 530 mL PEEP:  [5 cmH20] 5 cmH20 Pressure Support:  [5 cmH20] 5  cmH20 Plateau Pressure:  [21 cmH20-25 cmH20] 25 cmH20 INTAKE / OUTPUT:  Intake/Output Summary (Last 24 hours) at 06/01/14 0930 Last data filed at 06/01/14 0800  Gross per 24 hour  Intake 1977.16 ml  Output   4230 ml  Net -2252.84 ml    PHYSICAL EXAMINATION: General:  Awake and interactive Neuro:  Sedated, withdraws to pain HEENT: OG ETT in place Cardiovascular: s1, s2, regular Lungs:  No wheezing, mechanical breath sounds Abdomen: Soft + bs Musculoskeletal: Intact Skin:  Warm and dry, no rash noted  LABS:  CBC  Recent Labs Lab 05/30/14 0331 05/31/14 0515 06/01/14 0400  WBC 16.9* 12.7* 17.5*  HGB 12.2* 11.0* 12.4*  HCT 37.0* 34.1* 37.3*  PLT 243 314 372   Coag's  Recent Labs Lab 05/29/14 2340  INR 1.31   BMET  Recent Labs Lab 05/30/14 0331 05/31/14 0515 06/01/14 0400  NA 138 143 143  K 3.8 3.9 2.9*  CL 106 108 104  CO2 BUN 29* 27* 42*  CREATININE 1.12 1.11 1.35  GLUCOSE 178* 198* 281*   Electrolytes  Recent Labs Lab 05/30/14 0331 05/31/14 0515 06/01/14 0400  CALCIUM 8.7 8.6 9.0  MG  --  1.5 1.8  PHOS  --  4.0 3.6   Sepsis Markers  Recent Labs Lab 05/29/14 1443 05/29/14 1939  LATICACIDVEN 1.14 1.31   ABG  Recent Labs Lab 05/30/14 1523 05/31/14 0405 06/01/14  0424  PHART 7.402 7.419 7.509*  PCO2ART 39.3 33.2* 32.1*  PO2ART 80.0 82.0 86.9   Liver Enzymes  Recent Labs Lab 05/29/14 2340  AST 31  ALT 23  ALKPHOS 132*  BILITOT 1.1  ALBUMIN 2.7*   Cardiac Enzymes  Recent Labs Lab 05/30/14 0331 05/30/14 1215 06/01/14 0400  TROPONINI 1.01* 0.96* 0.83*   Glucose  Recent Labs Lab 05/31/14 1143 05/31/14 1632 05/31/14 2050 06/01/14 0003 06/01/14 0410 06/01/14 0739  GLUCAP 287* 255* 189* 256* 262* 341*   Imaging Dg Chest Port 1 View  05/31/2014   CLINICAL DATA:  Intubated patient.  Hypoxia.  EXAM: PORTABLE CHEST - 1 VIEW  COMPARISON:  Single view of the chest 05/30/2014 and 05/29/2014.  FINDINGS:  Endotracheal tube, endotracheal tube and left IJ catheter remain in place. A new NG tube is in place with the tip in the stomach. Right worse than left airspace disease persists but appears mildly improved. Heart size is normal. No pneumothorax.  IMPRESSION: NG tube tip is in the stomach.  Extensive right worse than left airspace disease show some improvement since yesterday's exam.   Electronically Signed   By: Drusilla Kannerhomas  Dalessio M.D.   On: 05/31/2014 07:21   ASSESSMENT / PLAN:  PULMONARY OETT 4/27>> A: BASDZ in renal transplant pt, on steroids, failed NIMVS, + trop P:   Bronch culture 4/27>>> Sputum cx 4/27>> Wean to extubate today. See ID DuoNebs, steroids D/C PCP coverage, fungal stain negative.  CARDIOVASCULAR CVL, Left IJ, 4/27 >> A:  Acute on chronic systolic heart failure HTN  RBBB ?silent anteroseptal wall MI in the past Elevated troponin - 1.03>1.01>0.96 P:  Heparin per cards Cardiac cath per cards after extubation Check troponin x1 Check 2D echo, pending Diureses  RENAL A:  Renal transplant 2013 Mg borderline P:   Careful with dye if CC performed Change lasix drip to 40 mg IV q8 hours. Zaroxolyn 5 mg PO x1. Potassium as ordered.  GASTROINTESTINAL A:  Recent Nausea P:   Phenergan PRN D/C TF. Start diet   HEMATOLOGIC A:  Chronic immunosuppression Leucocytosis -  P:  Follow labs Continue immunosuppressants  INFECTIOUS A: BL PNA Lactic acid 1.31 P:   BCx 24/26>> UC 4/26>> Sputum 4/27>> Broncho cx 4/27>> Pneumocystis smear 4/27>> Abx:  4/27 cefepime>> 4/27 diflucan(was on as opt)>> 4/27 vanc>> 4/28 Dapsone>>4/29  ENDOCRINE A:   DM  P:   SSI Levemir 15 unit daily  NEUROLOGIC A:  Sedated while on vent P:   RASS goal: -1 Fentanyl drip + Precedex  SUMMARY: Wean to extubate today, titrate O2 for sats, mobilize and PT as ordered, [facility] will sign off.  The patient is critically ill with multiple organ  systems failure and requires high complexity decision making for assessment and support, frequent evaluation and titration of therapies, application of advanced monitoring technologies and extensive interpretation of multiple databases.   Critical Care Time devoted to patient care services described in this note is  35  Minutes. This time reflects time of care of this signee Dr Koren BoundWesam Yacoub. This critical care time does not reflect procedure time, or teaching time or supervisory time of PA/NP/Med student/Med Resident etc but could involve care discussion time.  Alyson ReedyWesam G. Yacoub, M.D. [facility]. Pager: [phone]. After hours pager: [phone].

## 2014-06-01 NOTE — Progress Notes (Signed)
ANTIBIOTIC CONSULT NOTE  Pharmacy Consult for Cefepime and vancomycin Indication: rule out pneumonia and rule out sepsis  No Known Allergies  Patient Measurements: Height: 5\' 7"  (170.2 cm) Weight: 145 lb 1 oz (65.8 kg) IBW/kg (Calculated) : 66.1    Vital Signs: Temp: 97.9 F (36.6 C) (04/29 1100) Temp Source: Oral (04/29 1100) BP: 148/81 mmHg (04/29 1300) Pulse Rate: 88 (04/29 1300) Intake/Output from previous day: 04/28 0701 - 04/29 0700 In: 1865.7 [I.V.:630.7; NG/GT:635; IV Piggyback:600] Out: 3975 [Urine:3975] Intake/Output from this shift: Total I/O In: 188.1 [I.V.:88.1; NG/GT:100] Out: 1550 [Urine:1550]  Labs:  Recent Labs  05/30/14 0331 05/31/14 0515 06/01/14 0400  WBC 16.9* 12.7* 17.5*  HGB 12.2* 11.0* 12.4*  PLT 243 314 372  CREATININE 1.12 1.11 1.35   Estimated Creatinine Clearance: 54.2 mL/min (by C-G formula based on Cr of 1.35). No results for input(s): VANCOTROUGH, VANCOPEAK, VANCORANDOM, GENTTROUGH, GENTPEAK, GENTRANDOM, TOBRATROUGH, TOBRAPEAK, TOBRARND, AMIKACINPEAK, AMIKACINTROU, AMIKACIN in the last 72 hours.   Microbiology: Recent Results (from the past 720 hour(s))  Blood culture (routine x 2)     Status: None (Preliminary result)   Collection Time: 05/29/14  3:10 PM  Result Value Ref Range Status   Specimen Description BLOOD LEFT ARM  Final   Special Requests BOTTLES DRAWN AEROBIC AND ANAEROBIC 5ML  Final   Culture   Final           BLOOD CULTURE RECEIVED NO GROWTH TO DATE CULTURE WILL BE HELD FOR 5 DAYS BEFORE ISSUING A FINAL NEGATIVE REPORT Performed at [facility]    Report Status PENDING  Incomplete  Blood culture (routine x 2)     Status: None (Preliminary result)   Collection Time: 05/29/14  3:37 PM  Result Value Ref Range Status   Specimen Description BLOOD LEFT HAND  Final   Special Requests BOTTLES DRAWN AEROBIC AND ANAEROBIC 5CC  Final   Culture   Final           BLOOD CULTURE RECEIVED NO GROWTH TO DATE CULTURE WILL  BE HELD FOR 5 DAYS BEFORE ISSUING A FINAL NEGATIVE REPORT Performed at [facility]    Report Status PENDING  Incomplete  AFB culture with smear     Status: None (Preliminary result)   Collection Time: 05/30/14  1:36 PM  Result Value Ref Range Status   Specimen Description BRONCHIAL ALVEOLAR LAVAGE  Final   Special Requests LEFT LOWER LOBE  Final   Acid Fast Smear   Final    NO ACID FAST BACILLI SEEN Performed at [facility]    Culture   Final    CULTURE WILL BE EXAMINED FOR 6 WEEKS BEFORE ISSUING A FINAL REPORT Performed at [facility]    Report Status PENDING  Incomplete  Fungus Culture with Smear     Status: None (Preliminary result)   Collection Time: 05/30/14  1:36 PM  Result Value Ref Range Status   Specimen Description BRONCHIAL ALVEOLAR LAVAGE  Final   Special Requests LEFT LOWER LOBE  Final   Fungal Smear   Final    NO YEAST OR FUNGAL ELEMENTS SEEN Performed at [facility]    Culture   Final    CULTURE IN PROGRESS FOR FOUR WEEKS Performed at [facility]    Report Status PENDING  Incomplete  Culture, bal-quantitative     Status: None   Collection Time: 05/30/14  1:36 PM  Result Value Ref Range Status   Specimen Description BRONCHIAL ALVEOLAR LAVAGE  Final   Special Requests LEFT LOWER LOBE  Final   Gram Stain   Final    FEW WBC PRESENT, PREDOMINANTLY PMN NO SQUAMOUS EPITHELIAL CELLS SEEN NO ORGANISMS SEEN Performed at [facility]    Colony Count NO GROWTH Performed at [facility]   Final   Culture   Final    NO GROWTH 2 DAYS Performed at [facility]    Report Status 06/01/2014 FINAL  Final  AFB culture with smear     Status: None (Preliminary result)   Collection Time: 05/30/14  1:39 PM  Result Value Ref Range Status   Specimen Description BRONCHIAL ALVEOLAR LAVAGE  Final   Special Requests RIGHT LOWER LOBE  Final   Acid Fast Smear   Final    NO ACID FAST BACILLI SEEN Performed at  [facility]    Culture   Final    CULTURE WILL BE EXAMINED FOR 6 WEEKS BEFORE ISSUING A FINAL REPORT Performed at [facility]    Report Status PENDING  Incomplete  Culture, bal-quantitative     Status: None   Collection Time: 05/30/14  1:39 PM  Result Value Ref Range Status   Specimen Description BRONCHIAL ALVEOLAR LAVAGE  Final   Special Requests RIGHT LOWER LOBE  Final   Gram Stain   Final    MODERATE WBC PRESENT, PREDOMINANTLY PMN NO SQUAMOUS EPITHELIAL CELLS SEEN NO ORGANISMS SEEN Performed at [facility] Count NO GROWTH Performed at [facility]   Final   Culture   Final    NO GROWTH 2 DAYS Performed at [facility]    Report Status 06/01/2014 FINAL  Final  Fungus Culture with Smear     Status: None (Preliminary result)   Collection Time: 05/30/14  1:39 PM  Result Value Ref Range Status   Specimen Description BRONCHIAL ALVEOLAR LAVAGE  Final   Special Requests RIGHT LOWER LOBE  Final   Fungal Smear   Final    NO YEAST OR FUNGAL ELEMENTS SEEN Performed at [facility]    Culture   Final    CULTURE IN PROGRESS FOR FOUR WEEKS Performed at [facility]    Report Status PENDING  Incomplete  MRSA PCR Screening     Status: None   Collection Time: 06/01/14  9:51 AM  Result Value Ref Range Status   MRSA by PCR NEGATIVE NEGATIVE Final    Comment:        The GeneXpert MRSA Assay (FDA approved for NASAL specimens only), is one component of a comprehensive MRSA colonization surveillance program. It is not intended to diagnose MRSA infection nor to guide or monitor treatment for MRSA infections.     Assessment: 59 yo male with history of DM2, HTN and renal insufficiency s/p kidney transplant presents with SOB. Received doses of vancomycin and cefepime in the ED- cefepime continued upon transfer, vancomycin ordered to start again this morning.  WBC 17.5, afebrile. Patient is on IV methylpred.    Goal of Therapy:  Vancomycin trough level 15-20 mcg/ml  Plan:  -Cefepime 2g IV q12h -Vancomycin  IV q12h - check trough level tonight. -follow up c/s, LOT (anticipate 8 days based on PNA focused order set), renal function, trough at [facility] -patient is also on fluconazole PTA which has been continued here  Tad Moore, BCPS  Clinical Pharmacist Pager [phone]  06/01/2014 1:46 PM

## 2014-06-01 NOTE — Progress Notes (Signed)
ANTICOAGULATION CONSULT NOTE  Pharmacy Consult for Heparin Indication: chest pain/ACS  No Known Allergies  Patient Measurements: Height: 5\' 7"  (170.2 cm) Weight: 145 lb 1 oz (65.8 kg) IBW/kg (Calculated) : 66.1 Heparin Dosing Weight: 68 kg  Vital Signs: Temp: 97.9 F (36.6 C) (04/29 1100) Temp Source: Oral (04/29 1100) BP: 148/81 mmHg (04/29 1300) Pulse Rate: 88 (04/29 1300)  Labs:  Recent Labs  05/29/14 2340  05/30/14 0331 05/30/14 1215  05/31/14 0515 05/31/14 1112 06/01/14 0400  HGB  --   --  12.2*  --   --  11.0*  --  12.4*  HCT  --   --  37.0*  --   --  34.1*  --  37.3*  PLT  --   --  243  --   --  314  --  372  LABPROT 16.4*  --   --   --   --   --   --   --   INR 1.31  --   --   --   --   --   --   --   HEPARINUNFRC  --   < > 0.14* 0.12*  < > 0.42 0.63 0.42  CREATININE  --   --  1.12  --   --  1.11  --  1.35  TROPONINI 1.03*  --  1.01* 0.96*  --   --   --  0.83*  < > = values in this interval not displayed.  Estimated Creatinine Clearance: 54.2 mL/min (by C-G formula based on Cr of 1.35).  Marland Kitchen. dexmedetomidine Stopped (06/01/14 0744)  . feeding supplement (VITAL AF 1.2 CAL) 1,000 mL (06/01/14 0430)  . fentaNYL infusion INTRAVENOUS Stopped (06/01/14 0744)  . heparin 1,200 Units/hr (06/01/14 0617)     Assessment: 59yo male with HTN, DM and HLD presents with SOB. Pharmacy is consulted to dose heparin for ACS/chest pain - with small bump in TP 1.  Pt has History of renal transplant on immunosuppresants and now sedated on vent. Heparin drip 1200 uts/hr HL 0.42 No bleeding, CBC stable  Goal of Therapy:  Heparin level 0.3-0.7 units/ml Monitor platelets by anticoagulation protocol: Yes   Plan:  1. Continue heparin at current rate. 2. Daily heparin level and CBC. 3. F/u plans for anticoagulation after cath on Monday.  Tad MooreJessica Revis Whalin, Pharm D, BCPS  Clinical Pharmacist Pager [phone]  06/01/2014 1:47 PM

## 2014-06-01 NOTE — Progress Notes (Addendum)
Wasted 45 mL of Fentanyl with Gaspar Garbeanya Falcon Mccaskey RN.

## 2014-06-01 NOTE — Progress Notes (Signed)
[facility] Electrolyte Replacement Protocol  Patient Name: Steven Riddle DOB: 1955-02-09 MRN: [id number]  Date of Service  06/01/2014   HPI/Events of Note    Recent Labs Lab 05/29/14 1436 05/30/14 0331 05/31/14 0515 06/01/14 0400  NA 139 138 143 143  K 4.2 3.8 3.9 2.9*  CL 109 106 108 104  CO2 _0 GLUCOSE 211* 178* 198* 281*  BUN 27* 29* 27* 42*  CREATININE 1.02 1.12 1.11 1.35  CALCIUM 8.1* 8.7 8.6 9.0  MG  --   --  1.5 1.8  PHOS  --   --  4.0 3.6    Estimated Creatinine Clearance: 54.2 mL/min (by C-G formula based on Cr of 1.35).  Intake/Output      04/28 0701 - 04/29 0700   I.V. (mL/kg) 573.5 (8.7)   NG/GT 470   IV Piggyback 550   Total Intake(mL/kg) 1593.5 (24.2)   Urine (mL/kg/hr) 3550 (2.2)   Total Output 3550   Net -1956.6        - I/O DETAILED x24h    Total I/O In: 927.4 [I.V.:257.4; NG/GT:420; IV Piggyback:250] Out: 2425 [Urine:2425] - I/O THIS SHIFT    ASSESSMENT   eICURN Interventions   Electrolyte protocol criteria met. Lab values replaced per protocol. MD notified.   ASSESSMENT: MAJOR ELECTROLYTE    Lorene Dy 06/01/2014, 5:46 AM

## 2014-06-02 DIAGNOSIS — I5021 Acute systolic (congestive) heart failure: Secondary | ICD-10-CM

## 2014-06-02 DIAGNOSIS — E43 Unspecified severe protein-calorie malnutrition: Secondary | ICD-10-CM | POA: Diagnosis present

## 2014-06-02 LAB — BASIC METABOLIC PANEL
Anion gap: 13 (ref 5–15)
BUN: 59 mg/dL — AB (ref 6–23)
CO2: 28 mmol/L (ref 19–32)
Calcium: 8.9 mg/dL (ref 8.4–10.5)
Chloride: 101 mmol/L (ref 96–112)
Creatinine, Ser: 1.75 mg/dL — ABNORMAL HIGH (ref 0.50–1.35)
GFR calc Af Amer: 47 mL/min — ABNORMAL LOW (ref >90)
GFR calc non Af Amer: 41 mL/min — ABNORMAL LOW (ref >90)
Glucose, Bld: 181 mg/dL — ABNORMAL HIGH (ref 70–99)
POTASSIUM: 3.1 mmol/L — AB (ref 3.5–5.1)
SODIUM: 142 mmol/L (ref 135–145)

## 2014-06-02 LAB — GLUCOSE, CAPILLARY
GLUCOSE-CAPILLARY: 104 mg/dL — AB (ref 70–99)
GLUCOSE-CAPILLARY: 330 mg/dL — AB (ref 70–99)
Glucose-Capillary: 160 mg/dL — ABNORMAL HIGH (ref 70–99)
Glucose-Capillary: 198 mg/dL — ABNORMAL HIGH (ref 70–99)
Glucose-Capillary: 405 mg/dL — ABNORMAL HIGH (ref 70–99)

## 2014-06-02 LAB — CBC
HCT: 37 % — ABNORMAL LOW (ref 39.0–52.0)
Hemoglobin: 12.2 g/dL — ABNORMAL LOW (ref 13.0–17.0)
MCH: 28.2 pg (ref 26.0–34.0)
MCHC: 33 g/dL (ref 30.0–36.0)
MCV: 85.6 fL (ref 78.0–100.0)
PLATELETS: 342 10*3/uL (ref 150–400)
RBC: 4.32 MIL/uL (ref 4.22–5.81)
RDW: 14.3 % (ref 11.5–15.5)
WBC: 17.5 10*3/uL — ABNORMAL HIGH (ref 4.0–10.5)

## 2014-06-02 LAB — VANCOMYCIN, RANDOM: VANCOMYCIN RM: 27.5 ug/mL

## 2014-06-02 LAB — MAGNESIUM: Magnesium: 1.9 mg/dL (ref 1.5–2.5)

## 2014-06-02 LAB — HEPARIN LEVEL (UNFRACTIONATED): Heparin Unfractionated: 0.46 IU/mL (ref 0.30–0.70)

## 2014-06-02 LAB — PHOSPHORUS: Phosphorus: 4.8 mg/dL — ABNORMAL HIGH (ref 2.3–4.6)

## 2014-06-02 MED ORDER — ISOSORBIDE MONONITRATE ER 30 MG PO TB24
30.0000 mg | ORAL_TABLET | Freq: Every day | ORAL | Status: DC
  Administered 2014-06-02 – 2014-06-06 (×5): 30 mg via ORAL
  Filled 2014-06-02 (×5): qty 1

## 2014-06-02 MED ORDER — PREDNISONE 5 MG PO TABS
5.0000 mg | ORAL_TABLET | Freq: Every day | ORAL | Status: DC
  Administered 2014-06-03 – 2014-06-08 (×6): 5 mg via ORAL
  Filled 2014-06-02 (×8): qty 1

## 2014-06-02 MED ORDER — INSULIN ASPART 100 UNIT/ML ~~LOC~~ SOLN
0.0000 [IU] | Freq: Three times a day (TID) | SUBCUTANEOUS | Status: DC
Start: 2014-06-02 — End: 2014-06-08
  Administered 2014-06-02: 15 [IU] via SUBCUTANEOUS
  Administered 2014-06-02: 20 [IU] via SUBCUTANEOUS
  Administered 2014-06-03: 7 [IU] via SUBCUTANEOUS
  Administered 2014-06-03: 4 [IU] via SUBCUTANEOUS
  Administered 2014-06-04: 3 [IU] via SUBCUTANEOUS
  Administered 2014-06-05: 7 [IU] via SUBCUTANEOUS
  Administered 2014-06-06: 4 [IU] via SUBCUTANEOUS
  Administered 2014-06-06: 11 [IU] via SUBCUTANEOUS
  Administered 2014-06-07 – 2014-06-08 (×3): 4 [IU] via SUBCUTANEOUS

## 2014-06-02 MED ORDER — POTASSIUM CHLORIDE 10 MEQ/100ML IV SOLN
10.0000 meq | INTRAVENOUS | Status: AC
Start: 2014-06-02 — End: 2014-06-02
  Administered 2014-06-02 (×3): 10 meq via INTRAVENOUS
  Filled 2014-06-02: qty 100

## 2014-06-02 MED ORDER — SODIUM CHLORIDE 0.9 % IV BOLUS (SEPSIS)
250.0000 mL | Freq: Once | INTRAVENOUS | Status: AC
  Administered 2014-06-02: 250 mL via INTRAVENOUS

## 2014-06-02 MED ORDER — INSULIN DETEMIR 100 UNIT/ML ~~LOC~~ SOLN
15.0000 [IU] | Freq: Two times a day (BID) | SUBCUTANEOUS | Status: DC
  Administered 2014-06-02 – 2014-06-06 (×8): 15 [IU] via SUBCUTANEOUS
  Filled 2014-06-02 (×12): qty 0.15

## 2014-06-02 NOTE — Evaluation (Signed)
Physical Therapy Evaluation Patient Details Name: Steven Riddle MRN: [id number] DOB: 02-26-1955 Today's Date: 06/02/2014   History of Present Illness  59 y.o. male with hx of renal transplan 2013 admitted for airspace disease w/ acute hypoxic respiratory failure - B PNA, acute on chronic systolic hear failure, and Possible small non-Q-wave myocardial infarction.  Clinical Impression  Pt admitted with above complications. Pt currently with functional limitations due to the deficits listed below (see PT Problem List). Vitals stable throughout therapy session SpO2 lowest at 91% on room air, HR in 80s. Ambulated up to 100 feet however became weaker with progression and required mod assist for balance towards end of distance. Reviewed therapeutic exercises which he tolerated well. Very adamant to return home and has 24 hour supervision however, pt may require higher level of care for safety and rehab if he fails to progress. Will continue to monitor and update recommendations as appropriate. Pt will benefit from skilled PT to increase their independence and safety with mobility to allow discharge to the venue listed below.       Follow Up Recommendations Home health PT;Supervision/Assistance - 24 hour    Equipment Recommendations  Rolling walker with 5" wheels    Recommendations for Other Services OT consult     Precautions / Restrictions Precautions Precautions: None Restrictions Weight Bearing Restrictions: No      Mobility  Bed Mobility Overal bed mobility: Needs Assistance Bed Mobility: Supine to Sit     Supine to sit: Min guard;HOB elevated     General bed mobility comments: Min guard for safety. Requires extra time, use of rail and cues for technique.  Transfers Overall transfer level: Needs assistance Equipment used: 1 person hand held assist (IV pole) Transfers: Sit to/from Stand Sit to Stand: Min assist         General transfer comment: Min assist for boost to  stand. Reaches for furniture and IV  pole. Hand held assist for stability upon standing. Improves after standing for a minute.   Ambulation/Gait Ambulation/Gait assistance: Mod assist Ambulation Distance (Feet): 100 Feet Assistive device: 1 person hand held assist (IV pole) Gait Pattern/deviations: Step-through pattern;Decreased stride length;Wide base of support;Staggering left;Drifts right/left;Staggering right Gait velocity: decreased Gait velocity interpretation: Below normal speed for age/gender General Gait Details: initially min assist for balance however pt required mod assist for balance towards end of distance as legs began to buckle and balance decreased. VC for upright posture, forward gaze and awareness of symptoms. Pt declined feeling weak and acutally stated the initial part of his ambulation was more difficult than the final portion.  Stairs            Wheelchair Mobility    Modified Rankin (Stroke Patients Only)       Balance Overall balance assessment: Needs assistance Sitting-balance support: No upper extremity supported;Feet supported Sitting balance-Leahy Scale: Fair     Standing balance support: Single extremity supported Standing balance-Leahy Scale: Poor                               Pertinent Vitals/Pain Pain Assessment: No/denies pain    Home Living Family/patient expects to be discharged to:: Private residence Living Arrangements: Spouse/significant other;Children Available Help at Discharge: Family;Available 24 hours/day Type of Home: House Home Access: Stairs to enter Entrance Stairs-Rails: Doctor, general practice of Steps: 4-5 Home Layout: Two level;Able to live on main level with bedroom/bathroom Home Equipment: None  Prior Function Level of Independence: Independent               Hand Dominance   Dominant Hand: Left    Extremity/Trunk Assessment   Upper Extremity Assessment: Defer to OT  evaluation           Lower Extremity Assessment: Generalized weakness         Communication   Communication: No difficulties  Cognition Arousal/Alertness: Awake/alert Behavior During Therapy: WFL for tasks assessed/performed Overall Cognitive Status: Impaired/Different from baseline Area of Impairment: Safety/judgement         Safety/Judgement: Decreased awareness of deficits     General Comments: Poor insight into his generalized weakness.    General Comments General comments (skin integrity, edema, etc.): Adamant about returning home. SpO2 91% on room air while ambulating 93% on room air at rest.    Exercises General Exercises - Lower Extremity Ankle Circles/Pumps: AROM;Both;15 reps;Seated Long Arc Quad: Strengthening;Both;10 reps;Seated Hip ABduction/ADduction: Strengthening;Both;10 reps;Supine Straight Leg Raises: Strengthening;Both;10 reps;Supine      Assessment/Plan    PT Assessment Patient needs continued PT services  PT Diagnosis Difficulty walking;Abnormality of gait;Generalized weakness   PT Problem List Decreased strength;Decreased activity tolerance;Decreased balance;Decreased mobility;Decreased knowledge of use of DME;Decreased safety awareness;Cardiopulmonary status limiting activity  PT Treatment Interventions DME instruction;Gait training;Stair training;Functional mobility training;Therapeutic activities;Therapeutic exercise;Balance training;Neuromuscular re-education;Patient/family education   PT Goals (Current goals can be found in the Care Plan section) Acute Rehab PT Goals Patient Stated Goal: Go home PT Goal Formulation: With patient Time For Goal Achievement: 06/16/14 Potential to Achieve Goals: Good    Frequency Min 3X/week   Barriers to discharge        Co-evaluation               End of Session Equipment Utilized During Treatment: Gait belt Activity Tolerance: Patient limited by fatigue Patient left: in bed;with call  bell/phone within reach;with nursing/sitter in room Nurse Communication: Mobility status (SpO2)         Time: [id number] PT Time Calculation (min) (ACUTE ONLY): 31 min   Charges:   PT Evaluation $Initial PT Evaluation Tier I: 1 Procedure PT Treatments $Gait Training: 8-22 mins   PT G [place]:        Takesha Steger S 06/02/2014, 3:39 PM Sunday SpillersLogan Secor [place], [place] [phone]

## 2014-06-02 NOTE — Progress Notes (Signed)
[place] TEAM 1 - Stepdown/ICU TEAM Progress Note  Elliot Cousinmanullah Claus ZOX:[id number] DOB: 1955-02-04 DOA: 05/29/2014 PCP: Romero BellingELLISON, SEAN, MD  Admit HPI / Brief Narrative61: 58 yo [place] tran[place] who presented on 4/26 from his Endocrinologist's office with O2 sats 78% on room air. Noted one week of cough, white sputum, lower ext edema, with out fevers or chest pain. Noted to have + tropin's without acute EKG changes but he has Rt BBB. No prior cardiac history. CXR on admit 4/26 with B air space disease.     Following admission he remained on BIPAP >24 hours and was unable to come off.  He was therefore moved to the ICU under the care of PCCM.    Significant Events: 4/27 emergently intubated  4/27 CVL L IJ 4/29 extubated   HPI/Subjective: Pt is resting comfortably at present.  He denies cp, sob, n/v, or abdom pain.  He has no questions.   Assessment/Plan:  B airspace disease w/ acute hypoxic respiratory failure - B PNA Now titrated to 3L [place] - cont empiric abx tx and follow   Acute on chronic systolic heart failure Baseline wgt appears to be ~65kg - currently 62.4 - net negative 5L since admit - hold diuretic and give small volume IVF today in setting of climbing creatinine   Renal transplant 2013 / Chronic immunosuppression crt is climbing - was 1.12 at admit and appears to be <1.0 at baseline - followed by [facility] - gently hydrate today and follow - this may preclude cardiac cath planned for Monday  Hypokalemia  Replace - check Mg  HTN  BP currently reasonably well controlled - nitrates being added by Cards  RBBB  Elevated troponin - 1.03>1.01>0.96 - Possible small non-Q-wave myocardial infarction - CAD  history of possible silent anteroseptal wall MI in the past - Cards planning for cardiac cath Monday   DM A1c 12.1 - CBG uncontrolled - adjust insulin and follow   Code Status: FULL Family Communication: no family present at time  of exam Disposition Plan: SDU  Consultants: Cardiology - Dr. Sharyn LullHarwani  Antibiotics: 4/26 cefepime > 4/28 diflucan > 4/26 vanc > 4/28 Dapsone> 4/29  DVT prophylaxis: IV heparin   Objective: Blood pressure 138/65, pulse 90, temperature 98.9 F (37.2 C), temperature source Oral, resp. rate 21, height 5\' 7"  (1.702 m), weight 62.4 kg (137 lb 9.1 oz), SpO2 95 %.  Intake/Output Summary (Last 24 hours) at 06/02/14 1138 Last data filed at 06/02/14 [id number]  Gross per 24 hour  Intake   1752 ml  Output   2375 ml  Net   -623 ml   Exam: General: No acute respiratory distress at rest in chair  Lungs: fine diffuse crackles - no wheeze  Cardiovascular: Regular rate and rhythm without murmur gallop or rub  Abdomen: Nontender, nondistended, soft, bowel sounds positive, no rebound, no ascites, no appreciable mass Extremities: No significant cyanosis, clubbing, or edema bilateral lower extremities  Data Reviewed: Basic Metabolic Panel:  Recent Labs Lab 05/30/14 0331 05/31/14 0515 06/01/14 0400 06/01/14 2012 06/02/14 0414  NA 138 143 143 142 142  K 3.8 3.9 2.9* 3.1* 3.1*  CL 106 108 104 102 101  CO2 20 25 27 27 28   GLUCOSE 178* 198* 281* 146* 181*  BUN 29* 27* 42* 54* 59*  CREATININE 1.12 1.11 1.35 1.41* 1.75*  CALCIUM 8.7 8.6 9.0 8.6 8.9  MG  --  1.5 1.8  --  1.9  PHOS  --  4.0 3.6  --  4.8*   CBC:  Recent Labs Lab 05/29/14 1436 05/30/14 0331 05/31/14 0515 06/01/14 0400 06/02/14 0414  WBC 12.5* 16.9* 12.7* 17.5* 17.5*  NEUTROABS 10.7*  --   --   --   --   HGB 11.6* 12.2* 11.0* 12.4* 12.2*  HCT 35.5* 37.0* 34.1* 37.3* 37.0*  MCV 87.0 88.1 87.2 85.6 85.6  PLT 297 243 314 372 342   Liver Function Tests:  Recent Labs Lab 05/29/14 2340  AST 31  ALT 23  ALKPHOS 132*  BILITOT 1.1  PROT 7.2  ALBUMIN 2.7*   Coags:  Recent Labs Lab 05/29/14 2340  INR 1.31   Cardiac Enzymes:  Recent Labs Lab 05/29/14 2340 05/30/14 0331 05/30/14 1215 06/01/14 0400    TROPONINI 1.03* 1.01* 0.96* 0.83*   CBG:  Recent Labs Lab 06/01/14 1552 06/01/14 1953 06/01/14 2350 06/02/14 0354 06/02/14 0744  GLUCAP 215* 157* 104* 160* 198*    Recent Results (from the past 240 hour(s))  Blood culture (routine x 2)     Status: None (Preliminary result)   Collection Time: 05/29/14  3:10 PM  Result Value Ref Range Status   Specimen Description BLOOD LEFT ARM  Final   Special Requests BOTTLES DRAWN AEROBIC AND ANAEROBIC  Final   Culture   Final           BLOOD CULTURE RECEIVED NO GROWTH TO DATE CULTURE WILL BE HELD FOR 5 DAYS BEFORE ISSUING A FINAL NEGATIVE REPORT Performed at [facility]    Report Status PENDING  Incomplete  Blood culture (routine x 2)     Status: None (Preliminary result)   Collection Time: 05/29/14  3:37 PM  Result Value Ref Range Status   Specimen Description BLOOD LEFT HAND  Final   Special Requests BOTTLES DRAWN AEROBIC AND ANAEROBIC 5CC  Final   Culture   Final           BLOOD CULTURE RECEIVED NO GROWTH TO DATE CULTURE WILL BE HELD FOR 5 DAYS BEFORE ISSUING A FINAL NEGATIVE REPORT Performed at [facility]    Report Status PENDING  Incomplete  AFB culture with smear     Status: None (Preliminary result)   Collection Time: 05/30/14  1:36 PM  Result Value Ref Range Status   Specimen Description BRONCHIAL ALVEOLAR LAVAGE  Final   Special Requests LEFT LOWER LOBE  Final   Acid Fast Smear   Final    NO ACID FAST BACILLI SEEN Performed at [facility]    Culture   Final    CULTURE WILL BE EXAMINED FOR 6 WEEKS BEFORE ISSUING A FINAL REPORT Performed at [facility]    Report Status PENDING  Incomplete  Fungus Culture with Smear     Status: None (Preliminary result)   Collection Time: 05/30/14  1:36 PM  Result Value Ref Range Status   Specimen Description BRONCHIAL ALVEOLAR LAVAGE  Final   Special Requests LEFT LOWER LOBE  Final   Fungal Smear   Final    NO YEAST OR FUNGAL ELEMENTS  SEEN Performed at [facility]    Culture   Final    CULTURE IN PROGRESS FOR FOUR WEEKS Performed at [facility]    Report Status PENDING  Incomplete  Culture, bal-quantitative     Status: None   Collection Time: 05/30/14  1:36 PM  Result Value Ref Range Status   Specimen Description BRONCHIAL ALVEOLAR LAVAGE  Final   Special  Requests LEFT LOWER LOBE  Final   Gram Stain   Final    FEW WBC PRESENT, PREDOMINANTLY PMN NO SQUAMOUS EPITHELIAL CELLS SEEN NO ORGANISMS SEEN Performed at [facility]    Colony Count NO GROWTH Performed at [facility]   Final   Culture   Final    NO GROWTH 2 DAYS Performed at [facility]    Report Status 06/01/2014 FINAL  Final  AFB culture with smear     Status: None (Preliminary result)   Collection Time: 05/30/14  1:39 PM  Result Value Ref Range Status   Specimen Description BRONCHIAL ALVEOLAR LAVAGE  Final   Special Requests RIGHT LOWER LOBE  Final   Acid Fast Smear   Final    NO ACID FAST BACILLI SEEN Performed at [facility]    Culture   Final    CULTURE WILL BE EXAMINED FOR 6 WEEKS BEFORE ISSUING A FINAL REPORT Performed at [facility]    Report Status PENDING  Incomplete  Culture, bal-quantitative     Status: None   Collection Time: 05/30/14  1:39 PM  Result Value Ref Range Status   Specimen Description BRONCHIAL ALVEOLAR LAVAGE  Final   Special Requests RIGHT LOWER LOBE  Final   Gram Stain   Final    MODERATE WBC PRESENT, PREDOMINANTLY PMN NO SQUAMOUS EPITHELIAL CELLS SEEN NO ORGANISMS SEEN Performed at [facility] Count NO GROWTH Performed at [facility]   Final   Culture   Final    NO GROWTH 2 DAYS Performed at [facility]    Report Status 06/01/2014 FINAL  Final  Fungus Culture with Smear     Status: None (Preliminary result)   Collection Time: 05/30/14  1:39 PM  Result Value Ref Range Status   Specimen Description  BRONCHIAL ALVEOLAR LAVAGE  Final   Special Requests RIGHT LOWER LOBE  Final   Fungal Smear   Final    NO YEAST OR FUNGAL ELEMENTS SEEN Performed at [facility]    Culture   Final    CULTURE IN PROGRESS FOR FOUR WEEKS Performed at [facility]    Report Status PENDING  Incomplete  MRSA PCR Screening     Status: None   Collection Time: 06/01/14  9:51 AM  Result Value Ref Range Status   MRSA by PCR NEGATIVE NEGATIVE Final    Comment:        The GeneXpert MRSA Assay (FDA approved for NASAL specimens only), is one component of a comprehensive MRSA colonization surveillance program. It is not intended to diagnose MRSA infection nor to guide or monitor treatment for MRSA infections.      Studies:   Recent x-ray studies have been reviewed in detail by the Attending Physician  Scheduled Meds:  Scheduled Meds: . antiseptic oral rinse  7 mL Mouth Rinse q12n4p  . aspirin  81 mg Oral Daily  . atorvastatin  40 mg Oral q1800  . carvedilol  3.125 mg Oral BID WC  . ceFEPime (MAXIPIME) IV  2 g Intravenous Q12H  . chlorhexidine  15 mL Mouth Rinse BID  . clopidogrel  75 mg Oral Daily  . cycloSPORINE  100 mg Oral BID  . famotidine (PEPCID) IV  20 mg Intravenous Q12H  . fluconazole  150 mg Oral Daily  . insulin aspart  0-15 Units Subcutaneous 6 times per day  . insulin detemir  15 Units Subcutaneous Daily  .  isosorbide mononitrate  30 mg Oral Daily  . methylPREDNISolone (SOLU-MEDROL) injection  60 mg Intravenous Q24H  . mycophenolate  180 mg Oral Daily  . pantoprazole (PROTONIX) IV  40 mg Intravenous Q24H    Time spent on care of this patient: 35 mins   MCCLUNG,JEFFREY T , MD   [facility] Office  [phone] Pager - Text Page per Loretha Stapler as per below:  On-Call/Text Page:      [email or web address]      password [email or web address]  If 7PM-7AM, please contact night-coverage [email or web address] Password [email or web address] 06/02/2014, 11:38 AM   LOS: 4 days

## 2014-06-02 NOTE — Progress Notes (Signed)
ANTICOAGULATION CONSULT NOTE - Follow Up Consult  Pharmacy Consult for heparin Indication: chest pain/ACS  No Known Allergies  Patient Measurements: Height: 5\' 7"  (170.2 cm) Weight: 137 lb 9.1 oz (62.4 kg) IBW/kg (Calculated) : 66.1 Heparin Dosing Weight: 68 kg  Vital Signs: Temp: 98.9 F (37.2 C) (04/30 0744) Temp Source: Oral (04/30 0744) BP: 138/65 mmHg (04/30 0744) Pulse Rate: 90 (04/30 0744)  Labs:  Recent Labs  05/30/14 1215  05/31/14 0515 05/31/14 1112 06/01/14 0400 06/01/14 2012 06/02/14 0413 06/02/14 0414  HGB  --   < > 11.0*  --  12.4*  --   --  12.2*  HCT  --   --  34.1*  --  37.3*  --   --  37.0*  PLT  --   --  314  --  372  --   --  342  HEPARINUNFRC 0.12*  < > 0.42 0.63 0.42  --  0.46  --   CREATININE  --   < > 1.11  --  1.35 1.41*  --  1.75*  TROPONINI 0.96*  --   --   --  0.83*  --   --   --   < > = values in this interval not displayed.  Estimated Creatinine Clearance: 39.6 mL/min (by C-G formula based on Cr of 1.75).   Medications:  Scheduled:  . antiseptic oral rinse  7 mL Mouth Rinse q12n4p  . aspirin  81 mg Oral Daily  . atorvastatin  40 mg Oral q1800  . carvedilol  3.125 mg Oral BID WC  . ceFEPime (MAXIPIME) IV  2 g Intravenous Q12H  . chlorhexidine  15 mL Mouth Rinse BID  . clopidogrel  75 mg Oral Daily  . cycloSPORINE  100 mg Oral BID  . famotidine (PEPCID) IV  20 mg Intravenous Q12H  . fluconazole  150 mg Oral Daily  . insulin aspart  0-15 Units Subcutaneous 6 times per day  . insulin detemir  15 Units Subcutaneous Daily  . methylPREDNISolone (SOLU-MEDROL) injection  60 mg Intravenous Q24H  . mycophenolate  180 mg Oral Daily  . pantoprazole (PROTONIX) IV  40 mg Intravenous Q24H  . potassium chloride  10 mEq Intravenous Q1 Hr x 3    Assessment: 59 yo m admitted on 4/26 for SOB.  Patient had CP and + troponin, so pharmacy is consulted to dose heparin.  HL this AM therapeutic at 0.46 on 1200 units/hr. Hgb 12.2, plts 342, no bleeding  noted.  Goal of Therapy:  Heparin level 0.3-0.7 units/ml Monitor platelets by anticoagulation protocol: Yes   Plan:  Continue heparin 1200 units/hr AM HL & CBC Monitor hgb/plts, s/s of bleeding, clinical course  Cassie L. Roseanne RenoStewart, PharmD Clinical Pharmacy Resident Pager: [phone] 06/02/2014 8:42 AM

## 2014-06-02 NOTE — Progress Notes (Signed)
Subjective:  Patient denies any chest pain states breathing has improved. Creatinine gradually trending up off Lasix and Zaroxolyn now hopefully will start trending down. Antibiotics being adjusted by pharmacy  Objective:  Vital Signs in the last 24 hours: Temp:  [97.9 F (36.6 C)-98.9 F (37.2 C)] 98.9 F (37.2 C) (04/30 0744) Pulse Rate:  [81-93] 90 (04/30 0744) Resp:  [18-26] 21 (04/30 0744) BP: (113-148)/(50-81) 138/65 mmHg (04/30 0744) SpO2:  [93 %-100 %] 95 % (04/30 0744) Weight:  [62.4 kg (137 lb 9.1 oz)] 62.4 kg (137 lb 9.1 oz) (04/30 0356)  Intake/Output from previous day: 04/29 0701 - 04/30 0700 In: 1376.1 [P.O.:480; I.V.:496.1; NG/GT:100; IV Piggyback:300] Out: 3925 [Urine:3925] Intake/Output from this shift: Total I/O In: 564 [P.O.:240; I.V.:24; IV Piggyback:300] Out: -   Physical Exam: Neck: no adenopathy, no carotid bruit, no JVD and supple, symmetrical, trachea midline Lungs: Decreased breath sound at bases with occasional rhonchi Heart: regular rate and rhythm, S1, S2 normal and Soft systolic murmur and S3 gallop noted Abdomen: soft, non-tender; bowel sounds normal; no masses,  no organomegaly Extremities: extremities normal, atraumatic, no cyanosis or edema  Lab Results:  Recent Labs  06/01/14 0400 06/02/14 0414  WBC 17.5* 17.5*  HGB 12.4* 12.2*  PLT 372 342    Recent Labs  06/01/14 2012 06/02/14 0414  NA 142 142  K 3.1* 3.1*  CL 102 101  CO2 27 28  GLUCOSE 146* 181*  BUN 54* 59*  CREATININE 1.41* 1.75*    Recent Labs  05/30/14 1215 06/01/14 0400  TROPONINI 0.96* 0.83*   Hepatic Function Panel No results for input(s): PROT, ALBUMIN, AST, ALT, ALKPHOS, BILITOT, BILIDIR, IBILI in the last 72 hours. No results for input(s): CHOL in the last 72 hours. No results for input(s): PROTIME in the last 72 hours.  Imaging: Imaging results have been reviewed and Dg Chest Port 1 View  06/01/2014   CLINICAL DATA:  Intubation.  EXAM: PORTABLE CHEST  - 1 VIEW  COMPARISON:  05/31/2014.  FINDINGS: Endotracheal tube, left IJ line, NG tube in good anatomic position. Heart size stable. Persistent but improving bilateral pulmonary infiltrates. No pleural effusion or pneumothorax. No acute osseous abnormality.  IMPRESSION: 1. Lines and tubes in stable position. 2. Persistent but improving bilateral pulmonary infiltrates.   Electronically Signed   By: Maisie Fushomas  Register   On: 06/01/2014 07:16    Cardiac Studies:  Assessment/Plan:  Resolving Bilateral pneumonia Status post Acute respiratory failure secondary to above Possible small non-Q-wave myocardial infarction Coronary artery disease history of possible silent anteroseptal wall MI in the past Resolving decompensated systolic heart failure improved Hypertension Insulin requiring diabetes mellitus Peripheral neuropathy Anemia of chronic disease Depression Anxiety disorder End-stage renal disease status post renal transplant on chronic immunosuppressive therapy  Acute renal injury secondary to hypovolemia/antibiotics Hypokalemia Plan Agree with holding diuretics Add low-dose nitrates Check labs in a.m. and chest x-ray in a.m. Replace K as per CCM Scheduled for left cath possible PCI on Monday tentatively  LOS: 4 days    Steven Riddle N 06/02/2014, 9:56 AM

## 2014-06-03 ENCOUNTER — Inpatient Hospital Stay (HOSPITAL_COMMUNITY): Payer: PPO

## 2014-06-03 DIAGNOSIS — J189 Pneumonia, unspecified organism: Secondary | ICD-10-CM

## 2014-06-03 HISTORY — DX: Pneumonia, unspecified organism: J18.9

## 2014-06-03 LAB — COMPREHENSIVE METABOLIC PANEL
ALT: 36 U/L (ref 17–63)
AST: 36 U/L (ref 15–41)
Albumin: 2 g/dL — ABNORMAL LOW (ref 3.5–5.0)
Alkaline Phosphatase: 100 U/L (ref 38–126)
Anion gap: 10 (ref 5–15)
BUN: 50 mg/dL — ABNORMAL HIGH (ref 6–20)
CO2: 29 mmol/L (ref 22–32)
Calcium: 8.4 mg/dL — ABNORMAL LOW (ref 8.9–10.3)
Chloride: 102 mmol/L (ref 101–111)
Creatinine, Ser: 1.33 mg/dL — ABNORMAL HIGH (ref 0.61–1.24)
GFR calc Af Amer: >60 mL/min (ref >60)
GFR calc non Af Amer: 57 mL/min — ABNORMAL LOW (ref >60)
Glucose, Bld: 131 mg/dL — ABNORMAL HIGH (ref 70–99)
Potassium: 2.8 mmol/L — ABNORMAL LOW (ref 3.5–5.1)
SODIUM: 141 mmol/L (ref 135–145)
TOTAL PROTEIN: 6 g/dL — AB (ref 6.5–8.1)
Total Bilirubin: 1.2 mg/dL (ref 0.3–1.2)

## 2014-06-03 LAB — GLUCOSE, CAPILLARY
GLUCOSE-CAPILLARY: 183 mg/dL — AB (ref 70–99)
Glucose-Capillary: 139 mg/dL — ABNORMAL HIGH (ref 70–99)
Glucose-Capillary: 113 mg/dL — ABNORMAL HIGH (ref 70–99)
Glucose-Capillary: 181 mg/dL — ABNORMAL HIGH (ref 70–99)
Glucose-Capillary: 212 mg/dL — ABNORMAL HIGH (ref 70–99)

## 2014-06-03 LAB — CBC
HEMATOCRIT: 34.6 % — AB (ref 39.0–52.0)
HEMOGLOBIN: 11.2 g/dL — AB (ref 13.0–17.0)
MCH: 27.7 pg (ref 26.0–34.0)
MCHC: 32.4 g/dL (ref 30.0–36.0)
MCV: 85.6 fL (ref 78.0–100.0)
PLATELETS: 326 10*3/uL (ref 150–400)
RBC: 4.04 MIL/uL — ABNORMAL LOW (ref 4.22–5.81)
RDW: 14.1 % (ref 11.5–15.5)
WBC: 13.8 10*3/uL — ABNORMAL HIGH (ref 4.0–10.5)

## 2014-06-03 LAB — VANCOMYCIN, RANDOM: VANCOMYCIN RM: 13 ug/mL

## 2014-06-03 LAB — MAGNESIUM: Magnesium: 2 mg/dL (ref 1.7–2.4)

## 2014-06-03 LAB — BRAIN NATRIURETIC PEPTIDE: B NATRIURETIC PEPTIDE 5: 753.1 pg/mL — AB (ref 0.0–100.0)

## 2014-06-03 LAB — HIV ANTIBODY (ROUTINE TESTING W REFLEX): HIV Screen 4th Generation wRfx: NONREACTIVE

## 2014-06-03 LAB — HEPARIN LEVEL (UNFRACTIONATED)
HEPARIN UNFRACTIONATED: 1.25 [IU]/mL — AB (ref 0.30–0.70)
Heparin Unfractionated: 0.95 IU/mL — ABNORMAL HIGH (ref 0.30–0.70)

## 2014-06-03 LAB — TROPONIN I: Troponin I: 0.97 ng/mL (ref <0.031)

## 2014-06-03 MED ORDER — SODIUM CHLORIDE 0.9 % IV BOLUS (SEPSIS)
250.0000 mL | Freq: Once | INTRAVENOUS | Status: AC
  Administered 2014-06-03: 250 mL via INTRAVENOUS

## 2014-06-03 MED ORDER — POTASSIUM CHLORIDE CRYS ER 20 MEQ PO TBCR
20.0000 meq | EXTENDED_RELEASE_TABLET | Freq: Once | ORAL | Status: AC
  Administered 2014-06-03: 20 meq via ORAL
  Filled 2014-06-03: qty 1

## 2014-06-03 MED ORDER — ASPIRIN 81 MG PO CHEW
81.0000 mg | CHEWABLE_TABLET | ORAL | Status: AC
Start: 2014-06-04 — End: 2014-06-04
  Administered 2014-06-04: 81 mg via ORAL
  Filled 2014-06-03: qty 1

## 2014-06-03 MED ORDER — VANCOMYCIN HCL IN DEXTROSE 750-5 MG/150ML-% IV SOLN
750.0000 mg | INTRAVENOUS | Status: DC
  Administered 2014-06-03 – 2014-06-05 (×3): 750 mg via INTRAVENOUS
  Filled 2014-06-03 (×4): qty 150

## 2014-06-03 MED ORDER — POTASSIUM CHLORIDE CRYS ER 20 MEQ PO TBCR
40.0000 meq | EXTENDED_RELEASE_TABLET | Freq: Once | ORAL | Status: AC
  Administered 2014-06-03: 40 meq via ORAL
  Filled 2014-06-03: qty 2

## 2014-06-03 NOTE — Progress Notes (Signed)
ANTICOAGULATION CONSULT NOTE  Pharmacy Consult for heparin Indication: chest pain/ACS  No Known Allergies  Patient Measurements: Height: 5\' 7"  (170.2 cm) Weight: 137 lb 9.1 oz (62.4 kg) IBW/kg (Calculated) : 66.1 Heparin Dosing Weight: 68 kg  Vital Signs: Temp: 97.3 F (36.3 C) (04/30 2000) Temp Source: Oral (04/30 2000) BP: 140/61 mmHg (05/01 0350) Pulse Rate: 75 (05/01 0350)  Labs:  Recent Labs  06/01/14 0400 06/01/14 2012 06/02/14 0413 06/02/14 0414 06/03/14 0506 06/03/14 0507  HGB 12.4*  --   --  12.2* 11.2*  --   HCT 37.3*  --   --  37.0* 34.6*  --   PLT 372  --   --  342 326  --   HEPARINUNFRC 0.42  --  0.46  --   --  0.95*  CREATININE 1.35 1.41*  --  1.75*  --   --   TROPONINI 0.83*  --   --   --   --   --     Estimated Creatinine Clearance: 39.6 mL/min (by C-G formula based on Cr of 1.75).  Assessment: 59 yo male with NSTEMI awaiting cath, for heparin  Goal of Therapy:  Heparin level 0.3-0.7 units/ml Monitor platelets by anticoagulation protocol: Yes   Plan:  Decrease Heparin 1050 units/hr  Check heparin level in 8 hours.   Geannie RisenGreg Hanaa Payes, PharmD, BCPS   06/03/2014 5:43 AM

## 2014-06-03 NOTE — Progress Notes (Signed)
Subjective:  Denies any chest pain or shortness of breath. Renal function improving.  Objective:  Vital Signs in the last 24 hours: Temp:  [97.3 F (36.3 C)-98.7 F (37.1 C)] 97.5 F (36.4 C) (05/01 0731) Pulse Rate:  [73-90] 79 (05/01 0730) Resp:  [16-20] 17 (05/01 0731) BP: (131-144)/(56-65) 144/65 mmHg (05/01 0731) SpO2:  [91 %-99 %] 98 % (05/01 0731) Weight:  [62.4 kg (137 lb 9.1 oz)] 62.4 kg (137 lb 9.1 oz) (05/01 0333)  Intake/Output from previous day: 04/30 0701 - 05/01 0700 In: 1252 [P.O.:480; I.V.:372; IV Piggyback:400] Out: 1100 [Urine:1100] Intake/Output from this shift: Total I/O In: 184 [I.V.:34; IV Piggyback:150] Out: 400 [Urine:400]  Physical Exam: Neck: no adenopathy, no carotid bruit, no JVD and supple, symmetrical, trachea midline Lungs: Faint bilateral rales noted Heart: regular rate and rhythm, S1, S2 normal and Soft systolic murmur and S3 gallop noted Abdomen: soft, non-tender; bowel sounds normal; no masses,  no organomegaly Extremities: extremities normal, atraumatic, no cyanosis or edema  Lab Results:  Recent Labs  06/02/14 0414 06/03/14 0506  WBC 17.5* 13.8*  HGB 12.2* 11.2*  PLT 342 326    Recent Labs  06/02/14 0414 06/03/14 0506  NA 142 141  K 3.1* 2.8*  CL 101 102  CO2 28 29  GLUCOSE 181* 131*  BUN 59* 50*  CREATININE 1.75* 1.33*    Recent Labs  06/01/14 0400 06/03/14 0506  TROPONINI 0.83* 0.97*   Hepatic Function Panel  Recent Labs  06/03/14 0506  PROT 6.0*  ALBUMIN 2.0*  AST 36  ALT 36  ALKPHOS 100  BILITOT 1.2   No results for input(s): CHOL in the last 72 hours. No results for input(s): PROTIME in the last 72 hours.  Imaging: Imaging results have been reviewed and Dg Chest 2 View  06/03/2014   CLINICAL DATA:  Congestive heart failure.  EXAM: CHEST  2 VIEW  COMPARISON:  06/01/2014  FINDINGS: Left IJ central line tip overlies the level of the superior vena cava. Endotracheal tube and nasogastric tube have been  removed.  There is elevation of the right hemidiaphragm. The heart is enlarged. There patchy lung densities bilaterally, showing minimal improvement since most recent exam and showing continued improvement over the last several days.  IMPRESSION: 1. Removal of endotracheal tube and nasogastric tube. 2. Improving aeration.   Electronically Signed   By: Norva PavlovElizabeth  Brown M.D.   On: 06/03/2014 09:25    Cardiac Studies:  Assessment/Plan:  Resolving Bilateral pneumonia Status post Acute respiratory failure secondary to above Possible small non-Q-wave myocardial infarction Coronary artery disease history of possible silent anteroseptal wall MI in the past Resolving decompensated systolic heart failure improved Hypertension Insulin requiring diabetes mellitus Peripheral neuropathy Anemia of chronic disease Depression Anxiety disorder End-stage renal disease status post renal transplant on chronic immunosuppressive therapy  Acute renal injury secondary to hypovolemia/antibiotics improved Plan Discussed with patient and his wife regarding left cath possible PTCA stenting this risk and benefits i.e. death MI stroke need for emergency CABG local vascular complications worsening renal function etc. and consents for PCI Hypokalemia  LOS: 5 days    [name], Carolyn Sylvia 06/03/2014, 10:27 AM

## 2014-06-03 NOTE — Progress Notes (Signed)
ANTIBIOTIC CONSULT NOTE - FOLLOW UP  Pharmacy Consult for Vancomycin  Indication: rule out pneumonia and rule out sepsis  No Known Allergies  Labs:  Recent Labs  06/01/14 0400 06/01/14 2012 06/02/14 0414 06/03/14 0506  WBC 17.5*  --  17.5* 13.8*  HGB 12.4*  --  12.2* 11.2*  PLT 372  --  342 326  CREATININE 1.35 1.41* 1.75*  --    Estimated Creatinine Clearance: 39.6 mL/min (by C-G formula based on Cr of 1.75).  Recent Labs  06/01/14 2200 06/02/14 0412 06/03/14 0504  VANCOTROUGH 25.9*  --   --   VANCORANDOM  --  27.5 13    Assessment: 59 y.o. male with with PNA for empiric antibiotics  Goal of Therapy:  Vancomycin trough level 15-20 mcg/ml  Plan:  Change vancomycin 750 mg IV q24h F/U renal function  Geannie RisenGreg Avett Reineck, PharmD, BCPS

## 2014-06-03 NOTE — Progress Notes (Signed)
ANTICOAGULATION CONSULT NOTE  Pharmacy Consult for heparin Indication: chest pain/ACS  No Known Allergies  Patient Measurements: Height: 5\' 7"  (170.2 cm) Weight: 137 lb 9.1 oz (62.4 kg) IBW/kg (Calculated) : 66.1 Heparin Dosing Weight: 68 kg  Vital Signs: Temp: 97.6 F (36.4 C) (05/01 1120) Temp Source: Oral (05/01 1120) BP: 105/52 mmHg (05/01 1411) Pulse Rate: 79 (05/01 0730)  Labs:  Recent Labs  06/01/14 0400 06/01/14 2012 06/02/14 0413 06/02/14 0414 06/03/14 0506 06/03/14 0507 06/03/14 1429  HGB 12.4*  --   --  12.2* 11.2*  --   --   HCT 37.3*  --   --  37.0* 34.6*  --   --   PLT 372  --   --  342 326  --   --   HEPARINUNFRC 0.42  --  0.46  --   --  0.95* 1.25*  CREATININE 1.35 1.41*  --  1.75* 1.33*  --   --   TROPONINI 0.83*  --   --   --  0.97*  --   --     Estimated Creatinine Clearance: 52.1 mL/min (by C-G formula based on Cr of 1.33).  Assessment: 59 yo male with NSTEMI awaiting cath, for heparin Heparin level continues to be elevated at 1.25 -- level drawn appropriately per RN  Goal of Therapy:  Heparin level 0.3-0.7 units/ml Monitor platelets by anticoagulation protocol: Yes   Plan:  Hold heparin x 1 hours Decrease heparin to 850 units / hr Heparin level 8 hours after heparin decreased  Thank you. Okey RegalLisa Coreyon Nicotra, PharmD [phone] 06/03/2014 3:53 PM

## 2014-06-03 NOTE — Progress Notes (Signed)
[name] - Stepdown/ICU TEAM Progress Note  Steven Riddle WUX:[id number] DOB: [birth year] DOA: 05/29/2014 PCP: Steven Belling, MD  Admit HPI / Brief Narrative: 59 yo [place] male s/p renal tranpslant in 2013 in [place] who presented on 4/26 from his Endocrinologist's office with O2 sats 78% on room air. Noted one week of cough, white sputum, lower ext edema, with out fevers or chest pain. Noted to have + tropin's without acute EKG changes but he has Rt BBB. No prior cardiac history. CXR on admit 4/26 with B air space disease.     Following admission he remained on BIPAP >24 hours and was unable to come off.  He was therefore moved to the ICU under the care of PCCM.    Significant Events: 4/27 emergently intubated  4/27 CVL L IJ 4/29 extubated   HPI/Subjective: Pt is resting comfortably in the bed.  He denies cp, sob, n/v, or abdom pain.   Assessment/Plan:  B airspace disease w/ acute hypoxic respiratory failure - B PNA Now titrated to 2L [place] - cont empiric abx tx to complete 7 days of therapy and follow   Chronic systolic heart failure Baseline wgt appears to be ~65kg - weight 62.4 > 62.4 over last 24 hours - remains net negative 5.5L since admit - hold diuretic and increase IVF today in setting of elevated creatinine with plan for cardiac cath tomorrow   Renal transplant 2013 / Chronic immunosuppression crt improved with modest volume expansion yesterday - was 1.12 at admit and appears to be <1.0 at baseline - followed by [facility] - discussed with on-call nephrologist - will expand volume further today and reassess creatinine/GFR in a.m. - mucomyst is not recommended in this setting per nephrology with emphasis to be placed on volume expansion  Hypokalemia  Replace - check Mg  HTN  BP currently reasonably well controlled   RBBB  Elevated troponin - 1.03>1.01>0.96 - Possible small non-Q-wave myocardial infarction - CAD  history of possible silent anteroseptal  wall MI in the past - Cards planning for cardiac cath Monday   DM A1c 12.1 - CBG has improved - follow without change today  Code Status: FULL Family Communication: no family present at time of exam Disposition Plan: SDU  Consultants: Cardiology - Dr. Sharyn Riddle  Antibiotics: 4/26 cefepime > 4/28 diflucan > 4/26 vanc > 4/28 Dapsone> 4/29  DVT prophylaxis: IV heparin   Objective: Blood pressure 105/52, pulse 79, temperature 97.6 F (36.4 C), temperature source Oral, resp. rate 18, height 5\' 7"  (1.702 m), weight 62.4 kg (137 lb 9.1 oz), SpO2 98 %.  Intake/Output Summary (Last 24 hours) at 06/03/14 1508 Last data filed at 06/03/14 1400  Gross per 24 hour  Intake    630 ml  Output   1500 ml  Net   -870 ml   Exam: General: No acute respiratory distress  Lungs: Clear to auscultation today with no wheeze Cardiovascular: Regular rate and rhythm without murmur gallop or rub  Abdomen: Nontender, nondistended, soft, bowel sounds positive, no rebound, no ascites, no appreciable mass Extremities: No significant cyanosis, clubbing, or edema bilateral lower extremities  Data Reviewed: Basic Metabolic Panel:  Recent Labs Lab 05/31/14 0515 06/01/14 0400 06/01/14 2012 06/02/14 0414 06/03/14 0506  NA 143 143 142 142 141  K 3.9 2.9* 3.1* 3.1* 2.8*  CL 108 104 102 101 102  CO2 25 27 27 28 29   GLUCOSE 198* 281* 146* 181* 131*  BUN 27* 42* 54* 59* 50*  CREATININE  1.11 1.35 1.41* 1.75* 1.33*  CALCIUM 8.6 9.0 8.6 8.9 8.4*  MG 1.5 1.8  --  1.9 2.0  PHOS 4.0 3.6  --  4.8*  --    CBC:  Recent Labs Lab 05/29/14 1436 05/30/14 0331 05/31/14 0515 06/01/14 0400 06/02/14 0414 06/03/14 0506  WBC 12.5* 16.9* 12.7* 17.5* 17.5* 13.8*  NEUTROABS 10.7*  --   --   --   --   --   HGB 11.6* 12.2* 11.0* 12.4* 12.2* 11.2*  HCT 35.5* 37.0* 34.1* 37.3* 37.0* 34.6*  MCV 87.0 88.1 87.2 85.6 85.6 85.6  PLT 297 243 314 372 342 326   Liver Function Tests:  Recent Labs Lab 05/29/14 2340  06/03/14 0506  AST 31 36  ALT 23 36  ALKPHOS 132* 100  BILITOT 1.1 1.2  PROT 7.2 6.0*  ALBUMIN 2.7* 2.0*   Coags:  Recent Labs Lab 05/29/14 2340  INR 1.31   Cardiac Enzymes:  Recent Labs Lab 05/29/14 2340 05/30/14 0331 05/30/14 1215 06/01/14 0400 06/03/14 0506  TROPONINI 1.03* 1.01* 0.96* 0.83* 0.97*   CBG:  Recent Labs Lab 06/02/14 1148 06/02/14 1628 06/02/14 2235 06/03/14 0729 06/03/14 1119  GLUCAP 405* 330* 139* 113* 181*    Recent Results (from the past 240 hour(s))  Blood culture (routine x 2)     Status: None (Preliminary result)   Collection Time: 05/29/14  3:10 PM  Result Value Ref Range Status   Specimen Description BLOOD LEFT ARM  Final   Special Requests BOTTLES DRAWN AEROBIC AND ANAEROBIC  Final   Culture   Final           BLOOD CULTURE RECEIVED NO GROWTH TO DATE CULTURE WILL BE HELD FOR 5 DAYS BEFORE ISSUING A FINAL NEGATIVE REPORT Performed at [facility]    Report Status PENDING  Incomplete  Blood culture (routine x 2)     Status: None (Preliminary result)   Collection Time: 05/29/14  3:37 PM  Result Value Ref Range Status   Specimen Description BLOOD LEFT HAND  Final   Special Requests BOTTLES DRAWN AEROBIC AND ANAEROBIC 5CC  Final   Culture   Final           BLOOD CULTURE RECEIVED NO GROWTH TO DATE CULTURE WILL BE HELD FOR 5 DAYS BEFORE ISSUING A FINAL NEGATIVE REPORT Performed at [facility]    Report Status PENDING  Incomplete  AFB culture with smear     Status: None (Preliminary result)   Collection Time: 05/30/14  1:36 PM  Result Value Ref Range Status   Specimen Description BRONCHIAL ALVEOLAR LAVAGE  Final   Special Requests LEFT LOWER LOBE  Final   Acid Fast Smear   Final    NO ACID FAST BACILLI SEEN Performed at [facility]    Culture   Final    CULTURE WILL BE EXAMINED FOR 6 WEEKS BEFORE ISSUING A FINAL REPORT Performed at [facility]    Report Status PENDING  Incomplete  Fungus  Culture with Smear     Status: None (Preliminary result)   Collection Time: 05/30/14  1:36 PM  Result Value Ref Range Status   Specimen Description BRONCHIAL ALVEOLAR LAVAGE  Final   Special Requests LEFT LOWER LOBE  Final   Fungal Smear   Final    NO YEAST OR FUNGAL ELEMENTS SEEN Performed at [facility]    Culture   Final    CULTURE IN PROGRESS FOR FOUR WEEKS Performed at  [facility]    Report Status PENDING  Incomplete  Culture, bal-quantitative     Status: None   Collection Time: 05/30/14  1:36 PM  Result Value Ref Range Status   Specimen Description BRONCHIAL ALVEOLAR LAVAGE  Final   Special Requests LEFT LOWER LOBE  Final   Gram Stain   Final    FEW WBC PRESENT, PREDOMINANTLY PMN NO SQUAMOUS EPITHELIAL CELLS SEEN NO ORGANISMS SEEN Performed at [facility]    Colony Count NO GROWTH Performed at [facility]   Final   Culture   Final    NO GROWTH 2 DAYS Performed at [facility]    Report Status 06/01/2014 FINAL  Final  AFB culture with smear     Status: None (Preliminary result)   Collection Time: 05/30/14  1:39 PM  Result Value Ref Range Status   Specimen Description BRONCHIAL ALVEOLAR LAVAGE  Final   Special Requests RIGHT LOWER LOBE  Final   Acid Fast Smear   Final    NO ACID FAST BACILLI SEEN Performed at [facility]    Culture   Final    CULTURE WILL BE EXAMINED FOR 6 WEEKS BEFORE ISSUING A FINAL REPORT Performed at [facility]    Report Status PENDING  Incomplete  Culture, bal-quantitative     Status: None   Collection Time: 05/30/14  1:39 PM  Result Value Ref Range Status   Specimen Description BRONCHIAL ALVEOLAR LAVAGE  Final   Special Requests RIGHT LOWER LOBE  Final   Gram Stain   Final    MODERATE WBC PRESENT, PREDOMINANTLY PMN NO SQUAMOUS EPITHELIAL CELLS SEEN NO ORGANISMS SEEN Performed at [facility]    Colony Count NO GROWTH Performed at [facility]   Final    Culture   Final    NO GROWTH 2 DAYS Performed at [facility]    Report Status 06/01/2014 FINAL  Final  Fungus Culture with Smear     Status: None (Preliminary result)   Collection Time: 05/30/14  1:39 PM  Result Value Ref Range Status   Specimen Description BRONCHIAL ALVEOLAR LAVAGE  Final   Special Requests RIGHT LOWER LOBE  Final   Fungal Smear   Final    NO YEAST OR FUNGAL ELEMENTS SEEN Performed at [facility]    Culture   Final    CULTURE IN PROGRESS FOR FOUR WEEKS Performed at [facility]    Report Status PENDING  Incomplete  MRSA PCR Screening     Status: None   Collection Time: 06/01/14  9:51 AM  Result Value Ref Range Status   MRSA by PCR NEGATIVE NEGATIVE Final    Comment:        The GeneXpert MRSA Assay (FDA approved for NASAL specimens only), is one component of a comprehensive MRSA colonization surveillance program. It is not intended to diagnose MRSA infection nor to guide or monitor treatment for MRSA infections.      Studies:   Recent x-ray studies have been reviewed in detail by the Attending Physician  Scheduled Meds:  Scheduled Meds: . antiseptic oral rinse  7 mL Mouth Rinse q12n4p  . aspirin  81 mg Oral Daily  . [START ON 06/04/2014] aspirin  81 mg Oral Pre-Cath  . atorvastatin  40 mg Oral q1800  . carvedilol  3.125 mg Oral BID WC  . ceFEPime (MAXIPIME) IV  2 g Intravenous Q12H  . chlorhexidine  15 mL Mouth Rinse  BID  . clopidogrel  75 mg Oral Daily  . cycloSPORINE  100 mg Oral BID  . fluconazole  150 mg Oral Daily  . insulin aspart  0-20 Units Subcutaneous TID WC  . insulin detemir  15 Units Subcutaneous BID  . isosorbide mononitrate  30 mg Oral Daily  . mycophenolate  180 mg Oral Daily  . predniSONE  5 mg Oral Q breakfast  . vancomycin  750 mg Intravenous Q24H    Time spent on care of this patient: 35 mins   Raaga Maeder T , MD   [facility] Office  [phone] Pager - Text Page per Loretha Stapler as  per below:  On-Call/Text Page:      [email or web address]      password [email or web address]  If 7PM-7AM, please contact night-coverage [email or web address] Password [email or web address] 06/03/2014, 3:08 PM   LOS: 5 days

## 2014-06-04 ENCOUNTER — Encounter (HOSPITAL_COMMUNITY)
Admission: EM | Disposition: A | Discharge status: Home Health | Payer: PPO | Source: Home / Self Care | Attending: Internal Medicine

## 2014-06-04 HISTORY — PX: CARDIAC CATHETERIZATION: SHX172

## 2014-06-04 LAB — GLUCOSE, CAPILLARY
GLUCOSE-CAPILLARY: 310 mg/dL — AB (ref 70–99)
Glucose-Capillary: 85 mg/dL (ref 70–99)
Glucose-Capillary: 135 mg/dL — ABNORMAL HIGH (ref 70–99)

## 2014-06-04 LAB — CBC
HCT: 34.8 % — ABNORMAL LOW (ref 39.0–52.0)
Hemoglobin: 11.1 g/dL — ABNORMAL LOW (ref 13.0–17.0)
MCH: 27.9 pg (ref 26.0–34.0)
MCHC: 31.9 g/dL (ref 30.0–36.0)
MCV: 87.4 fL (ref 78.0–100.0)
PLATELETS: 289 10*3/uL (ref 150–400)
RBC: 3.98 MIL/uL — AB (ref 4.22–5.81)
RDW: 13.9 % (ref 11.5–15.5)
WBC: 12.8 10*3/uL — AB (ref 4.0–10.5)

## 2014-06-04 LAB — CULTURE, BLOOD (ROUTINE X 2)
CULTURE: NO GROWTH
CULTURE: NO GROWTH

## 2014-06-04 LAB — BASIC METABOLIC PANEL
Anion gap: 6 (ref 5–15)
BUN: 36 mg/dL — AB (ref 6–20)
CALCIUM: 8.1 mg/dL — AB (ref 8.9–10.3)
CO2: 28 mmol/L (ref 22–32)
CREATININE: 1.1 mg/dL (ref 0.61–1.24)
Chloride: 103 mmol/L (ref 101–111)
GFR calc non Af Amer: >60 mL/min (ref >60)
Glucose, Bld: 187 mg/dL — ABNORMAL HIGH (ref 70–99)
Potassium: 4 mmol/L (ref 3.5–5.1)
Sodium: 137 mmol/L (ref 135–145)

## 2014-06-04 LAB — POCT ACTIVATED CLOTTING TIME: ACTIVATED CLOTTING TIME: 392 s

## 2014-06-04 LAB — MAGNESIUM: Magnesium: 1.7 mg/dL (ref 1.7–2.4)

## 2014-06-04 LAB — HEPARIN LEVEL (UNFRACTIONATED)
HEPARIN UNFRACTIONATED: 0.74 [IU]/mL — AB (ref 0.30–0.70)
Heparin Unfractionated: 0.49 IU/mL (ref 0.30–0.70)

## 2014-06-04 SURGERY — CORONARY STENT INTERVENTION

## 2014-06-04 MED ORDER — PRASUGREL HCL 10 MG PO TABS
ORAL_TABLET | ORAL | Status: AC
  Filled 2014-06-04: qty 1

## 2014-06-04 MED ORDER — SODIUM CHLORIDE 0.9 % IV SOLN
INTRAVENOUS | Status: DC | PRN
  Administered 2014-06-04: 150 mL/h via INTRAVENOUS

## 2014-06-04 MED ORDER — SODIUM CHLORIDE 0.9 % IV SOLN
250.0000 mL | INTRAVENOUS | Status: DC | PRN
  Administered 2014-06-04: 500 mL via INTRAVENOUS

## 2014-06-04 MED ORDER — SODIUM CHLORIDE 0.9 % IV SOLN
INTRAVENOUS | Status: DC | PRN
  Administered 2014-06-04: 999 mL/h via INTRAVENOUS

## 2014-06-04 MED ORDER — SODIUM CHLORIDE 0.9 % IV SOLN: INTRAVENOUS | Status: DC

## 2014-06-04 MED ORDER — BIVALIRUDIN 250 MG IV SOLR
INTRAVENOUS | Status: AC
  Filled 2014-06-04: qty 250

## 2014-06-04 MED ORDER — FENTANYL CITRATE (PF) 100 MCG/2ML IJ SOLN
INTRAMUSCULAR | Status: DC | PRN
  Administered 2014-06-04: 25 ug via INTRAVENOUS

## 2014-06-04 MED ORDER — SODIUM BICARBONATE 8.4 % IV SOLN
INTRAVENOUS | Status: AC
  Filled 2014-06-04: qty 1000

## 2014-06-04 MED ORDER — FENTANYL CITRATE (PF) 100 MCG/2ML IJ SOLN
INTRAMUSCULAR | Status: AC
  Filled 2014-06-04: qty 2

## 2014-06-04 MED ORDER — SODIUM BICARBONATE BOLUS VIA INFUSION
INTRAVENOUS | Status: AC
  Administered 2014-06-04: 06:00:00 via INTRAVENOUS
  Filled 2014-06-04: qty 1

## 2014-06-04 MED ORDER — NITROGLYCERIN 1 MG/10 ML FOR IR/CATH LAB
INTRA_ARTERIAL | Status: AC
  Filled 2014-06-04: qty 10

## 2014-06-04 MED ORDER — SODIUM CHLORIDE 0.9 % IJ SOLN
3.0000 mL | Freq: Two times a day (BID) | INTRAMUSCULAR | Status: DC
  Administered 2014-06-04 – 2014-06-06 (×4): 3 mL via INTRAVENOUS

## 2014-06-04 MED ORDER — MIDAZOLAM HCL 2 MG/2ML IJ SOLN
INTRAMUSCULAR | Status: AC
  Filled 2014-06-04: qty 2

## 2014-06-04 MED ORDER — GLUCERNA SHAKE PO LIQD
237.0000 mL | Freq: Three times a day (TID) | ORAL | Status: DC
Start: 2014-06-04 — End: 2014-06-08
  Administered 2014-06-04 – 2014-06-08 (×8): 237 mL via ORAL
  Filled 2014-06-04 (×7): qty 237

## 2014-06-04 MED ORDER — ONDANSETRON HCL 4 MG/2ML IJ SOLN: 4.0000 mg | Freq: Four times a day (QID) | INTRAMUSCULAR | Status: DC | PRN

## 2014-06-04 MED ORDER — NITROGLYCERIN 0.2 MG/ML ON CALL CATH LAB
INTRAVENOUS | Status: DC | PRN
  Administered 2014-06-04 (×3): 150 ug via INTRAVENOUS

## 2014-06-04 MED ORDER — PRASUGREL HCL 10 MG PO TABS
10.0000 mg | ORAL_TABLET | Freq: Every day | ORAL | Status: DC
  Administered 2014-06-05 – 2014-06-08 (×4): 10 mg via ORAL
  Filled 2014-06-04 (×5): qty 1

## 2014-06-04 MED ORDER — SODIUM CHLORIDE 0.9 % IJ SOLN: 3.0000 mL | INTRAMUSCULAR | Status: DC | PRN

## 2014-06-04 MED ORDER — ASPIRIN 81 MG PO CHEW
81.0000 mg | CHEWABLE_TABLET | Freq: Every day | ORAL | Status: DC
  Administered 2014-06-05 – 2014-06-08 (×3): 81 mg via ORAL
  Filled 2014-06-04 (×3): qty 1

## 2014-06-04 MED ORDER — PRASUGREL HCL 10 MG PO TABS
ORAL_TABLET | ORAL | Status: DC | PRN
  Administered 2014-06-04: 60 mg via ORAL

## 2014-06-04 MED ORDER — ACETAMINOPHEN 325 MG PO TABS
650.0000 mg | ORAL_TABLET | ORAL | Status: DC | PRN
Start: 2014-06-04 — End: 2014-06-07

## 2014-06-04 MED ORDER — HEPARIN (PORCINE) IN NACL 2-0.9 UNIT/ML-% IJ SOLN
INTRAMUSCULAR | Status: AC
  Filled 2014-06-04: qty 1000

## 2014-06-04 MED ORDER — MIDAZOLAM HCL 2 MG/2ML IJ SOLN
INTRAMUSCULAR | Status: DC | PRN
  Administered 2014-06-04: 1 mg via INTRAVENOUS

## 2014-06-04 MED ORDER — ATROPINE SULFATE 0.1 MG/ML IJ SOLN
INTRAMUSCULAR | Status: AC
  Filled 2014-06-04: qty 10

## 2014-06-04 MED ORDER — IOHEXOL 350 MG/ML SOLN
INTRAVENOUS | Status: DC | PRN
  Administered 2014-06-04: 50 mL via INTRA_ARTERIAL
  Administered 2014-06-04: 100 mL via INTRA_ARTERIAL
  Administered 2014-06-04: 50 mL via INTRA_ARTERIAL

## 2014-06-04 MED ORDER — SODIUM CHLORIDE 0.9 % IV SOLN: INTRAVENOUS | Status: AC

## 2014-06-04 MED ORDER — BIVALIRUDIN BOLUS VIA INFUSION
INTRAVENOUS | Status: DC | PRN
  Administered 2014-06-04: 47.1 mg via INTRAVENOUS

## 2014-06-04 MED ORDER — SODIUM CHLORIDE 0.9 % IV SOLN
250.0000 mg | INTRAVENOUS | Status: DC | PRN
  Administered 2014-06-04: 1.75 mg/kg/h via INTRAVENOUS

## 2014-06-04 MED ORDER — SODIUM CHLORIDE 0.9 % IV SOLN: 250.0000 mL | INTRAVENOUS | Status: DC | PRN

## 2014-06-04 MED ORDER — SODIUM CHLORIDE 0.9 % IJ SOLN
3.0000 mL | Freq: Two times a day (BID) | INTRAMUSCULAR | Status: DC
  Administered 2014-06-04: 3 mL via INTRAVENOUS

## 2014-06-04 MED ORDER — LIDOCAINE HCL (PF) 1 % IJ SOLN
INTRAMUSCULAR | Status: AC
  Filled 2014-06-04: qty 30

## 2014-06-04 SURGICAL SUPPLY — 21 items
BALLN EMERGE MR 2.5X15 (BALLOONS) ×3
BALLN ~~LOC~~ EMERGE MR 2.75X15 (BALLOONS) ×3
BALLN ~~LOC~~ EMERGE MR 2.75X20 (BALLOONS) ×3
BALLOON EMERGE MR 2.5X15 (BALLOONS) IMPLANT
BALLOON ~~LOC~~ EMERGE MR 2.75X15 (BALLOONS) IMPLANT
BALLOON ~~LOC~~ EMERGE MR 2.75X20 (BALLOONS) IMPLANT
CATH INFINITI 5 FR 3DRC (CATHETERS) ×2 IMPLANT
CATH INFINITI 5FR AL1 (CATHETERS) ×2 IMPLANT
CATH INFINITI 5FR MULTPACK ANG (CATHETERS) ×3 IMPLANT
GUIDE CATH RUNWAY 6FR VL3 (CATHETERS) ×2 IMPLANT
KIT ENCORE 26 ADVANTAGE (KITS) ×2 IMPLANT
KIT HEART LEFT (KITS) ×3 IMPLANT
PACK CARDIAC CATHETERIZATION (CUSTOM PROCEDURE TRAY) ×3 IMPLANT
SHEATH PINNACLE 5F 10CM (SHEATH) ×3 IMPLANT
SHEATH PINNACLE 6F 10CM (SHEATH) ×2 IMPLANT
STENT XIENCE ALPINE RX 2.75X38 (Permanent Stent) ×2 IMPLANT
SYR MEDRAD MARK V 150ML (SYRINGE) ×3 IMPLANT
TRANSDUCER W/STOPCOCK (MISCELLANEOUS) ×3 IMPLANT
WIRE EMERALD 3MM-J .035X150CM (WIRE) ×3 IMPLANT
WIRE PT2 MS 185 (WIRE) ×2 IMPLANT
WIRE RUNTHROUGH .014X180CM (WIRE) ×2 IMPLANT

## 2014-06-04 NOTE — Progress Notes (Signed)
ANTICOAGULATION CONSULT NOTE - Follow Up Consult  Pharmacy Consult for Heparin  Indication: chest pain/ACS  No Known Allergies  Patient Measurements: Height: 5\' 7"  (170.2 cm) Weight: 137 lb 9.1 oz (62.4 kg) IBW/kg (Calculated) : 66.1  Vital Signs: Temp: 98.4 F (36.9 C) (05/02 0000) Temp Source: Oral (05/02 0000) BP: 126/64 mmHg (05/02 0000) Pulse Rate: 72 (05/02 0000)  Labs:  Recent Labs  06/01/14 0400  06/02/14 0414 06/03/14 0506 06/03/14 0507 06/03/14 1429 06/04/14 0035  HGB 12.4*  --  12.2* 11.2*  --   --  11.1*  HCT 37.3*  --  37.0* 34.6*  --   --  34.8*  PLT 372  --  342 326  --   --  289  HEPARINUNFRC 0.42  < >  --   --  0.95* 1.25* 0.74*  CREATININE 1.35  < > 1.75* 1.33*  --   --  1.10  TROPONINI 0.83*  --   --  0.97*  --   --   --   < > = values in this interval not displayed.  Estimated Creatinine Clearance: 63 mL/min (by C-G formula based on Cr of 1.1).   Assessment: Supra-therapeutic heparin level, no issues per RN.   Goal of Therapy:  Heparin level 0.3-0.7 units/ml Monitor platelets by anticoagulation protocol: Yes   Plan:  -Decrease heparin infusion to 750 units/hr -0800 HL -Daily CBC/HL -Monitor for bleeding  Abran DukeLedford, Cricket Goodlin 06/04/2014,1:08 AM

## 2014-06-04 NOTE — Progress Notes (Signed)
PT Cancellation Note  Patient Details Name: Steven Riddle MRN: [id number] DOB: 01-30-1956   Cancelled Treatment:    Reason Eval/Treat Not Completed: Medical issues which prohibited therapy (pt in cath lab and not currently available)   Toney Sangabor, Zachory Mangual [facility] 06/04/2014, 11:20 AM Delaney MeigsMaija Tabor Kathlean Cinco, PT [phone]

## 2014-06-04 NOTE — Interval H&P Note (Signed)
Cath Lab Visit (complete for each Cath Lab visit)  Clinical Evaluation Leading to the Procedure:   ACS: Yes.    Non-ACS:    Anginal Classification: CCS IV  Anti-ischemic medical therapy: Maximal Therapy (2 or more classes of medications)  Non-Invasive Test Results: No non-invasive testing performed  Prior CABG: No previous CABG      History and Physical Interval Note:  06/04/2014 11:23 AM  Steven Riddle  has presented today for surgery, with the diagnosis of unstable angian  The various methods of treatment have been discussed with the patient and family. After consideration of risks, benefits and other options for treatment, the patient has consented to  Procedure(s): Left Heart Cath and Coronary Angiography (N/A) as a surgical intervention .  The patient's history has been reviewed, patient examined, no change in status, stable for surgery.  I have reviewed the patient's chart and labs.  Questions were answered to the patient's satisfaction.     [name], Deatrice Spanbauer

## 2014-06-04 NOTE — Progress Notes (Signed)
[place] TEAM 1 - Stepdown/ICU TEAM Progress Note  Steven Riddle [id number] DOB: Oct 03, 1955 DOA: 05/29/2014 PCP: Romero BellingELLISON, SEAN, MD  Admit HPI / Brief Narrative53: 58 yo [place] male s/[place] renal tran[place]lant in 2013 in [place] who presented on 4/26 from his Endocrinologist's office with O2 sats 78% on room air. Noted one week of cough, white sputum, lower ext edema, with out fevers or chest pain. Noted to have + tropin's without acute EKG changes but he has Rt BBB. No prior cardiac history. CXR on admit 4/26 with B air space disease.     Following admission he remained on BIPAP >24 hours and was unable to come off.  He was therefore moved to the ICU under the care of PCCM.    Significant Events: 4/27 emergently intubated  4/27 CVL L IJ 4/29 extubated   HPI/Subjective: The patient has no complaints whatsoever today.  He denies chest pain shortness breath fevers chills nausea vomiting or abdominal pain.  His respirations are calm and comfortable.  Assessment/Plan:  B airspace disease w/ acute hypoxic respiratory failure - B Community acquired PNA Now titrated to 2L [place] - cont empiric abx tx to complete 7 days of therapy - wean to RA as able   Chronic systolic heart failure Baseline wgt appears to be ~65kg - weight 62.4 > 62.8 over last 24 hours - remains net negative 4.4L since admit - hold diuretic and continue IVF for today  Renal transplant 2013 / Chronic immunosuppression crt improved with modest volume expansion yesterday - was 1.12 at admit and appears to be <1.0 at baseline - followed by [facility] - discussed with on-call nephrologist - mucomyst is not recommended in this setting per nephrology with emphasis to be placed on volume expansion - cont IVF today  Hypokalemia  Corrected - Mg acceptable   HTN  BP currently reasonably controlled - will avoid adjusting medical tx until after cardiac cath   RBBB  Elevated troponin - 1.03>1.01>0.96 - Possible small non-Q-wave  myocardial infarction - CAD  history of possible silent anteroseptal wall MI in the past - Cards planning for cardiac cath today   DM A1c 12.1 - CBG variable - follow while NPO for cath today   Code Status: FULL Family Communication: no family present at time of exam Disposition Plan: SDU  Consultants: Cardiology - Dr. Sharyn LullHarwani  Antibiotics: 4/26 cefepime > 4/28 diflucan > 4/26 vanc > 4/28 Dapsone> 4/29  DVT prophylaxis: IV heparin   Objective: Blood pressure 145/68, pulse 77, temperature 97.9 F (36.6 C), temperature source Oral, resp. rate 16, height 5\' 7"  (1.702 m), weight 62.8 kg (138 lb 7.2 oz), SpO2 95 %.  Intake/Output Summary (Last 24 hours) at 06/04/14 0912 Last data filed at 06/04/14 0806  Gross per 24 hour  Intake 2246.6 ml  Output   1475 ml  Net  771.6 ml   Exam: General: No acute respiratory distress - alert and conversant  Lungs: Clear to auscultation with no wheeze Cardiovascular: Regular rate and rhythm without murmur gallop or rub  Abdomen: Nontender, nondistended, soft, bowel sounds positive, no rebound, no ascites, no appreciable mass Extremities: No significant cyanosis, clubbing, edema bilateral lower extremities  Data Reviewed: Basic Metabolic Panel:  Recent Labs Lab 05/31/14 0515 06/01/14 0400 06/01/14 2012 06/02/14 0414 06/03/14 0506 06/04/14 0035  NA 143 143 142 142 141 137  K 3.9 2.9* 3.1* 3.1* 2.8* 4.0  CL 108 104 102 101 102 103  CO2 25 27 27 28 29  28  GLUCOSE 198* 281* 146* 181* 131* 187*  BUN 27* 42* 54* 59* 50* 36*  CREATININE 1.11 1.35 1.41* 1.75* 1.33* 1.10  CALCIUM 8.6 9.0 8.6 8.9 8.4* 8.1*  MG 1.5 1.8  --  1.9 2.0 1.7  PHOS 4.0 3.6  --  4.8*  --   --    CBC:  Recent Labs Lab 05/29/14 1436  05/31/14 0515 06/01/14 0400 06/02/14 0414 06/03/14 0506 06/04/14 0035  WBC 12.5*  < > 12.7* 17.5* 17.5* 13.8* 12.8*  NEUTROABS 10.7*  --   --   --   --   --   --   HGB 11.6*  < > 11.0* 12.4* 12.2* 11.2* 11.1*  HCT 35.5*  <  > 34.1* 37.3* 37.0* 34.6* 34.8*  MCV 87.0  < > 87.2 85.6 85.6 85.6 87.4  PLT 297  < > 314 372 342 326 289  < > = values in this interval not displayed.   Liver Function Tests:  Recent Labs Lab 05/29/14 2340 06/03/14 0506  AST 31 36  ALT 23 36  ALKPHOS 132* 100  BILITOT 1.1 1.2  PROT 7.2 6.0*  ALBUMIN 2.7* 2.0*   Coags:  Recent Labs Lab 05/29/14 2340  INR 1.31   Cardiac Enzymes:  Recent Labs Lab 05/29/14 2340 05/30/14 0331 05/30/14 1215 06/01/14 0400 06/03/14 0506  TROPONINI 1.03* 1.01* 0.96* 0.83* 0.97*   CBG:  Recent Labs Lab 06/03/14 0729 06/03/14 1119 06/03/14 1638 06/03/14 2219 06/04/14 0735  GLUCAP 113* 181* 212* 183* 85    Recent Results (from the past 240 hour(s))  Blood culture (routine x 2)     Status: None (Preliminary result)   Collection Time: 05/29/14  3:10 PM  Result Value Ref Range Status   Specimen Description BLOOD LEFT ARM  Final   Special Requests BOTTLES DRAWN AEROBIC AND ANAEROBIC  Final   Culture   Final           BLOOD CULTURE RECEIVED NO GROWTH TO DATE CULTURE WILL BE HELD FOR 5 DAYS BEFORE ISSUING A FINAL NEGATIVE REPORT Performed at [facility]    Report Status PENDING  Incomplete  Blood culture (routine x 2)     Status: None (Preliminary result)   Collection Time: 05/29/14  3:37 PM  Result Value Ref Range Status   Specimen Description BLOOD LEFT HAND  Final   Special Requests BOTTLES DRAWN AEROBIC AND ANAEROBIC 5CC  Final   Culture   Final           BLOOD CULTURE RECEIVED NO GROWTH TO DATE CULTURE WILL BE HELD FOR 5 DAYS BEFORE ISSUING A FINAL NEGATIVE REPORT Performed at [facility]    Report Status PENDING  Incomplete  AFB culture with smear     Status: None (Preliminary result)   Collection Time: 05/30/14  1:36 PM  Result Value Ref Range Status   Specimen Description BRONCHIAL ALVEOLAR LAVAGE  Final   Special Requests LEFT LOWER LOBE  Final   Acid Fast Smear   Final    NO ACID FAST BACILLI  SEEN Performed at [facility]    Culture   Final    CULTURE WILL BE EXAMINED FOR 6 WEEKS BEFORE ISSUING A FINAL REPORT Performed at [facility]    Report Status PENDING  Incomplete  Fungus Culture with Smear     Status: None (Preliminary result)   Collection Time: 05/30/14  1:36 PM  Result Value Ref Range Status   Specimen Description BRONCHIAL  ALVEOLAR LAVAGE  Final   Special Requests LEFT LOWER LOBE  Final   Fungal Smear   Final    NO YEAST OR FUNGAL ELEMENTS SEEN Performed at [facility]    Culture   Final    CULTURE IN PROGRESS FOR FOUR WEEKS Performed at [facility]    Report Status PENDING  Incomplete  Culture, bal-quantitative     Status: None   Collection Time: 05/30/14  1:36 PM  Result Value Ref Range Status   Specimen Description BRONCHIAL ALVEOLAR LAVAGE  Final   Special Requests LEFT LOWER LOBE  Final   Gram Stain   Final    FEW WBC PRESENT, PREDOMINANTLY PMN NO SQUAMOUS EPITHELIAL CELLS SEEN NO ORGANISMS SEEN Performed at [facility] Count NO GROWTH Performed at [facility]   Final   Culture   Final    NO GROWTH 2 DAYS Performed at [facility]    Report Status 06/01/2014 FINAL  Final  AFB culture with smear     Status: None (Preliminary result)   Collection Time: 05/30/14  1:39 PM  Result Value Ref Range Status   Specimen Description BRONCHIAL ALVEOLAR LAVAGE  Final   Special Requests RIGHT LOWER LOBE  Final   Acid Fast Smear   Final    NO ACID FAST BACILLI SEEN Performed at [facility]    Culture   Final    CULTURE WILL BE EXAMINED FOR 6 WEEKS BEFORE ISSUING A FINAL REPORT Performed at [facility]    Report Status PENDING  Incomplete  Culture, bal-quantitative     Status: None   Collection Time: 05/30/14  1:39 PM  Result Value Ref Range Status   Specimen Description BRONCHIAL ALVEOLAR LAVAGE  Final   Special Requests RIGHT LOWER LOBE  Final   Gram Stain    Final    MODERATE WBC PRESENT, PREDOMINANTLY PMN NO SQUAMOUS EPITHELIAL CELLS SEEN NO ORGANISMS SEEN Performed at [facility] Count NO GROWTH Performed at [facility]   Final   Culture   Final    NO GROWTH 2 DAYS Performed at [facility]    Report Status 06/01/2014 FINAL  Final  Fungus Culture with Smear     Status: None (Preliminary result)   Collection Time: 05/30/14  1:39 PM  Result Value Ref Range Status   Specimen Description BRONCHIAL ALVEOLAR LAVAGE  Final   Special Requests RIGHT LOWER LOBE  Final   Fungal Smear   Final    NO YEAST OR FUNGAL ELEMENTS SEEN Performed at [facility]    Culture   Final    CULTURE IN PROGRESS FOR FOUR WEEKS Performed at [facility]    Report Status PENDING  Incomplete  MRSA PCR Screening     Status: None   Collection Time: 06/01/14  9:51 AM  Result Value Ref Range Status   MRSA by PCR NEGATIVE NEGATIVE Final    Comment:        The GeneXpert MRSA Assay (FDA approved for NASAL specimens only), is one component of a comprehensive MRSA colonization surveillance program. It is not intended to diagnose MRSA infection nor to guide or monitor treatment for MRSA infections.      Studies:   Recent x-ray studies have been reviewed in detail by the Attending Physician  Scheduled Meds:  Scheduled Meds: . aspirin  81 mg Oral Daily  . atorvastatin  40  mg Oral q1800  . carvedilol  3.125 mg Oral BID WC  . ceFEPime (MAXIPIME) IV  2 g Intravenous Q12H  . clopidogrel  75 mg Oral Daily  . cycloSPORINE  100 mg Oral BID  . fluconazole  150 mg Oral Daily  . insulin aspart  0-20 Units Subcutaneous TID WC  . insulin detemir  15 Units Subcutaneous BID  . isosorbide mononitrate  30 mg Oral Daily  . mycophenolate  180 mg Oral Daily  . predniSONE  5 mg Oral Q breakfast  . sodium bicarbonate (CIN) infusion   Intravenous Pre-Cath  . sodium chloride  3 mL Intravenous Q12H  . vancomycin  750  mg Intravenous Q24H    Time spent on care of this patient: 35 mins   Kennie Snedden T , MD   [facility] Office  [phone] Pager - Text Page per Loretha Stapler as per below:  On-Call/Text Page:      [email or web address]      password [email or web address]  If 7PM-7AM, please contact night-coverage [email or web address] Password [email or web address] 06/04/2014, 9:12 AM   LOS: 6 days

## 2014-06-04 NOTE — Progress Notes (Addendum)
Sheath removed at 1552. Manual pressure held for about 35 minutes. Sterile dressing applied. Pt VSS. No concerns at this time. Will continue to monitor closely.

## 2014-06-04 NOTE — Progress Notes (Signed)
Subjective:  Patient denies any chest pain or shortness of breath tolerated left cardiac cath/PTCA stenting to left circumflex and obtuse marginal with excellent angiographic results. Patient was noted to have three-vessel CAD with depressed LV systolic function discussed with patient and family prior to PCI patient and family absolutely refused for consideration for CABG and wanted to be treated percutaneously.  Objective:  Vital Signs in the last 24 hours: Temp:  [97.9 F (36.6 C)-98.6 F (37 C)] 98.6 F (37 C) (05/02 1645) Pulse Rate:  [0-130] 73 (05/02 1830) Resp:  [0-27] 19 (05/02 1830) BP: (114-151)/(44-77) 131/46 mmHg (05/02 1830) SpO2:  [0 %-99 %] 93 % (05/02 1830) Weight:  [62.8 kg (138 lb 7.2 oz)] 62.8 kg (138 lb 7.2 oz) (05/02 0410)  Intake/Output from previous day: 05/01 0701 - 05/02 0700 In: 2227.6 [P.O.:480; I.V.:1497.6; IV Piggyback:250] Out: 1475 [Urine:1475] Intake/Output from this shift: Total I/O In: 1466.2 [I.V.:1266.2; IV Piggyback:200] Out: 925 [Urine:925]  Physical Exam: Neck: no adenopathy, no carotid bruit, no JVD and supple, symmetrical, trachea midline Lungs: Clear to auscultation anteriorly Heart: regular rate and rhythm, S1, S2 normal and Soft systolic murmur noted Abdomen: soft, non-tender; bowel sounds normal; no masses,  no organomegaly Extremities: extremities normal, atraumatic, no cyanosis or edema and Right groin dressing dry  Lab Results:  Recent Labs  06/03/14 0506 06/04/14 0035  WBC 13.8* 12.8*  HGB 11.2* 11.1*  PLT 326 289    Recent Labs  06/03/14 0506 06/04/14 0035  NA 141 137  K 2.8* 4.0  CL 102 103  CO2 29 28  GLUCOSE 131* 187*  BUN 50* 36*  CREATININE 1.33* 1.10    Recent Labs  06/03/14 0506  TROPONINI 0.97*   Hepatic Function Panel  Recent Labs  06/03/14 0506  PROT 6.0*  ALBUMIN 2.0*  AST 36  ALT 36  ALKPHOS 100  BILITOT 1.2   No results for input(s): CHOL in the last 72 hours. No results for  input(s): PROTIME in the last 72 hours.  Imaging: Imaging results have been reviewed and Dg Chest 2 View  06/03/2014   CLINICAL DATA:  Congestive heart failure.  EXAM: CHEST  2 VIEW  COMPARISON:  06/01/2014  FINDINGS: Left IJ central line tip overlies the level of the superior vena cava. Endotracheal tube and nasogastric tube have been removed.  There is elevation of the right hemidiaphragm. The heart is enlarged. There patchy lung densities bilaterally, showing minimal improvement since most recent exam and showing continued improvement over the last several days.  IMPRESSION: 1. Removal of endotracheal tube and nasogastric tube. 2. Improving aeration.   Electronically Signed   By: Norva PavlovElizabeth  Brown M.D.   On: 06/03/2014 09:25    Cardiac Studies:  Assessment/Plan:  Possible small non-Q-wave myocardial infarction status post left cath/PTCA stenting to proximal left circumflex/obtuse marginal. With excellent endograft results. Multivessel CAD Resolving bilateral pneumonia Status post acute respiratory failure Coronary artery disease history of possible silent anteroseptal wall MI in the past Resolving decompensated systolic heart failure improved Hypertension Insulin requiring diabetes mellitus Peripheral neuropathy Anemia of chronic disease Depression Anxiety disorder End-stage renal disease status post renal transplant on chronic immunosuppressive therapy  Acute renal injury secondary to hypovolemia/antibiotics improved. Plan Continue present management Check labs in a.m. Will discuss with patient regarding staging PCI and few weeks. Versus this admission if chest pain. Phase I cardiac rehabilitation  LOS: 6 days    [name], Magdelyn Roebuck 06/04/2014, 6:42 PM

## 2014-06-04 NOTE — H&P (View-Only) (Signed)
Subjective:  Denies any chest pain or shortness of breath. Renal function improving.  Objective:  Vital Signs in the last 24 hours: Temp:  [97.3 F (36.3 C)-98.7 F (37.1 C)] 97.5 F (36.4 C) (05/01 0731) Pulse Rate:  [73-90] 79 (05/01 0730) Resp:  [16-20] 17 (05/01 0731) BP: (131-144)/(56-65) 144/65 mmHg (05/01 0731) SpO2:  [91 %-99 %] 98 % (05/01 0731) Weight:  [62.4 kg (137 lb 9.1 oz)] 62.4 kg (137 lb 9.1 oz) (05/01 0333)  Intake/Output from previous day: 04/30 0701 - 05/01 0700 In: 1252 [P.O.:480; I.V.:372; IV Piggyback:400] Out: 1100 [Urine:1100] Intake/Output from this shift: Total I/O In: 184 [I.V.:34; IV Piggyback:150] Out: 400 [Urine:400]  Physical Exam: Neck: no adenopathy, no carotid bruit, no JVD and supple, symmetrical, trachea midline Lungs: Faint bilateral rales noted Heart: regular rate and rhythm, S1, S2 normal and Soft systolic murmur and S3 gallop noted Abdomen: soft, non-tender; bowel sounds normal; no masses,  no organomegaly Extremities: extremities normal, atraumatic, no cyanosis or edema  Lab Results:  Recent Labs  06/02/14 0414 06/03/14 0506  WBC 17.5* 13.8*  HGB 12.2* 11.2*  PLT 342 326    Recent Labs  06/02/14 0414 06/03/14 0506  NA 142 141  K 3.1* 2.8*  CL 101 102  CO2 28 29  GLUCOSE 181* 131*  BUN 59* 50*  CREATININE 1.75* 1.33*    Recent Labs  06/01/14 0400 06/03/14 0506  TROPONINI 0.83* 0.97*   Hepatic Function Panel  Recent Labs  06/03/14 0506  PROT 6.0*  ALBUMIN 2.0*  AST 36  ALT 36  ALKPHOS 100  BILITOT 1.2   No results for input(s): CHOL in the last 72 hours. No results for input(s): PROTIME in the last 72 hours.  Imaging: Imaging results have been reviewed and Dg Chest 2 View  06/03/2014   CLINICAL DATA:  Congestive heart failure.  EXAM: CHEST  2 VIEW  COMPARISON:  06/01/2014  FINDINGS: Left IJ central line tip overlies the level of the superior vena cava. Endotracheal tube and nasogastric tube have been  removed.  There is elevation of the right hemidiaphragm. The heart is enlarged. There patchy lung densities bilaterally, showing minimal improvement since most recent exam and showing continued improvement over the last several days.  IMPRESSION: 1. Removal of endotracheal tube and nasogastric tube. 2. Improving aeration.   Electronically Signed   By: Elizabeth  Brown M.D.   On: 06/03/2014 09:25    Cardiac Studies:  Assessment/Plan:  Resolving Bilateral pneumonia Status post Acute respiratory failure secondary to above Possible small non-Q-wave myocardial infarction Coronary artery disease history of possible silent anteroseptal wall MI in the past Resolving decompensated systolic heart failure improved Hypertension Insulin requiring diabetes mellitus Peripheral neuropathy Anemia of chronic disease Depression Anxiety disorder End-stage renal disease status post renal transplant on chronic immunosuppressive therapy  Acute renal injury secondary to hypovolemia/antibiotics improved Plan Discussed with patient and his wife regarding left cath possible PTCA stenting this risk and benefits i.e. death MI stroke need for emergency CABG local vascular complications worsening renal function etc. and consents for PCI Hypokalemia  LOS: 5 days    Steven Riddle 06/03/2014, 10:27 AM    

## 2014-06-04 NOTE — Progress Notes (Signed)
ANTICOAGULATION / ANTIBIOTIC CONSULT NOTE  Pharmacy Consult for Heparin Indication: chest pain/ACS  No Known Allergies  Patient Measurements: Height: 5\' 7"  (170.2 cm) Weight: 138 lb 7.2 oz (62.8 kg) IBW/kg (Calculated) : 66.1 Heparin Dosing Weight: 68 kg  Vital Signs: Temp: 97.9 F (36.6 C) (05/02 0736) Temp Source: Oral (05/02 0736) BP: 145/68 mmHg (05/02 0806) Pulse Rate: 70 (05/02 1000)  Labs:  Recent Labs  06/02/14 0414 06/03/14 0506  06/03/14 1429 06/04/14 0035 06/04/14 0810  HGB 12.2* 11.2*  --   --  11.1*  --   HCT 37.0* 34.6*  --   --  34.8*  --   PLT 342 326  --   --  289  --   HEPARINUNFRC  --   --   < > 1.25* 0.74* 0.49  CREATININE 1.75* 1.33*  --   --  1.10  --   TROPONINI  --  0.97*  --   --   --   --   < > = values in this interval not displayed.  Estimated Creatinine Clearance: 63.4 mL/min (by C-G formula based on Cr of 1.1).  . sodium chloride 50 mL/hr at 06/04/14 0921  . heparin 750 Units/hr (06/04/14 25360627)     Assessment: 59yo male with hx renal transplant and on immunosupressants  HTN, DM and HLD presents with SOB. Pharmacy is consulted to dose heparin for ACS/chest pain - with small bump in TP peak at 1. Heparin drip 750  uts/hr HL 0.49 No bleeding, CBC stable  Vancomycin and cefepime for pneumonia.  Cr improved 1.7> 1.1, afebrile, WBC improved 17> 12 VR 4/30 27 - dose held and recheck 5/1 13 - dose adjusted accordingly   Goal of Therapy:  Heparin level 0.3-0.7 units/ml Monitor platelets by anticoagulation protocol: Yes  Vancomycin trough 15-20  Plan:  1. Continue heparin at 750 uts/hr . 2. Daily heparin level and CBC. 3. F/u after cath . 4.  Vancomycin 750mg  q24   Leota SauersLisa Kit Brubacher Pharm.D. CPP, BCPS Clinical Pharmacist [phone] 06/04/2014 10:28 AM

## 2014-06-04 NOTE — Progress Notes (Signed)
Nutrition Follow-up  DOCUMENTATION CODES:  Severe malnutrition in context of acute illness/injury  INTERVENTION:  Glucerna shake  NUTRITION DIAGNOSIS:  Inadequate oral intake related to inability to eat as evidenced by NPO status; ongoing  GOAL:  Patient will meet greater than or equal to 90% of their needs; unmet  MONITOR:  PO intake, Supplement acceptance, Diet advancement, Labs, Weight trends, Skin, I & O's  REASON FOR ASSESSMENT:  Consult Enteral/tube feeding initiation and management  ASSESSMENT: Patient was admitted on 4/26 with SOB. Emergently intubated on 4/27 due to acute respiratory failure.  Pt was extubated on 06/01/14 and TF was subsequently d/c. Nutritional needs re-estimated due to change in status.  Pt is currently NPO due to cardiac cath scheduled for today. He was previously on a Heart Healthy/ Carb Modified diet. Appetite remains poor; noted 25% meal completion. RD will add Glucerna Shake TID once diet is advanced in attempt to optimize nutritional adequacy.  Labs reviewed. BUN: 36, Calcium: 8.1, Glucose: 187. CBGS: 85-212.   Height:  Ht Readings from Last 1 Encounters:  05/30/14 5\' 7"  (1.702 m)    Weight:  Wt Readings from Last 1 Encounters:  06/04/14 138 lb 7.2 oz (62.8 kg)    Ideal Body Weight:  67.3 kg  Wt Readings from Last 10 Encounters:  06/04/14 138 lb 7.2 oz (62.8 kg)  11/20/13 144 lb (65.318 kg)  10/16/13 144 lb (65.318 kg)  04/05/13 144 lb (65.318 kg)  05/19/12 143 lb (64.864 kg)  01/06/12 134 lb (60.782 kg)  03/17/11 145 lb (65.772 kg)  03/10/11 145 lb (65.772 kg)  06/05/10 146 lb 3.2 oz (66.316 kg)  04/09/09 161 lb (73.029 kg)    BMI:  Body mass index is 21.68 kg/(m^2). Normal weight range  Estimated Nutritional Needs:  Kcal:  1700-1900  Protein:  95-110 gm  Fluid:  1.7-1.9 L  Skin:  Reviewed, no issues  Diet Order:  Diet NPO time specified Except for: Sips with Meds  EDUCATION NEEDS:  No education needs  identified at this time   Intake/Output Summary (Last 24 hours) at 06/04/14 0946 Last data filed at 06/04/14 0917  Gross per 24 hour  Intake 2106.6 ml  Output   2050 ml  Net   56.6 ml    Last BM:  06/04/14  Steven Riddle, RD, LDN, CDE Pager: [phone] After hours Pager: [phone]

## 2014-06-04 NOTE — Progress Notes (Signed)
Sheath remove

## 2014-06-05 ENCOUNTER — Encounter (HOSPITAL_COMMUNITY): Payer: Self-pay | Admitting: Cardiology

## 2014-06-05 ENCOUNTER — Inpatient Hospital Stay (HOSPITAL_COMMUNITY): Payer: PPO

## 2014-06-05 DIAGNOSIS — R778 Other specified abnormalities of plasma proteins: Secondary | ICD-10-CM | POA: Diagnosis present

## 2014-06-05 DIAGNOSIS — D849 Immunodeficiency, unspecified: Secondary | ICD-10-CM | POA: Diagnosis present

## 2014-06-05 DIAGNOSIS — E876 Hypokalemia: Secondary | ICD-10-CM | POA: Diagnosis present

## 2014-06-05 DIAGNOSIS — Z940 Kidney transplant status: Secondary | ICD-10-CM

## 2014-06-05 DIAGNOSIS — E1065 Type 1 diabetes mellitus with hyperglycemia: Secondary | ICD-10-CM

## 2014-06-05 DIAGNOSIS — J9621 Acute and chronic respiratory failure with hypoxia: Secondary | ICD-10-CM | POA: Diagnosis present

## 2014-06-05 DIAGNOSIS — D899 Disorder involving the immune mechanism, unspecified: Secondary | ICD-10-CM

## 2014-06-05 DIAGNOSIS — R7989 Other specified abnormal findings of blood chemistry: Secondary | ICD-10-CM

## 2014-06-05 DIAGNOSIS — I10 Essential (primary) hypertension: Secondary | ICD-10-CM

## 2014-06-05 LAB — CBC
HCT: 34.1 % — ABNORMAL LOW (ref 39.0–52.0)
HEMOGLOBIN: 11 g/dL — AB (ref 13.0–17.0)
MCH: 27.9 pg (ref 26.0–34.0)
MCHC: 32.3 g/dL (ref 30.0–36.0)
MCV: 86.5 fL (ref 78.0–100.0)
PLATELETS: 284 10*3/uL (ref 150–400)
RBC: 3.94 MIL/uL — ABNORMAL LOW (ref 4.22–5.81)
RDW: 13.8 % (ref 11.5–15.5)
WBC: 10.4 10*3/uL (ref 4.0–10.5)

## 2014-06-05 LAB — BASIC METABOLIC PANEL
ANION GAP: 5 (ref 5–15)
BUN: 22 mg/dL — ABNORMAL HIGH (ref 6–20)
CO2: 30 mmol/L (ref 22–32)
Calcium: 8 mg/dL — ABNORMAL LOW (ref 8.9–10.3)
Chloride: 102 mmol/L (ref 101–111)
Creatinine, Ser: 0.86 mg/dL (ref 0.61–1.24)
GFR calc Af Amer: >60 mL/min (ref >60)
Glucose, Bld: 335 mg/dL — ABNORMAL HIGH (ref 70–99)
Potassium: 3.6 mmol/L (ref 3.5–5.1)
SODIUM: 137 mmol/L (ref 135–145)

## 2014-06-05 LAB — GLUCOSE, CAPILLARY
GLUCOSE-CAPILLARY: 243 mg/dL — AB (ref 70–99)
Glucose-Capillary: 115 mg/dL — ABNORMAL HIGH (ref 70–99)
Glucose-Capillary: 109 mg/dL — ABNORMAL HIGH (ref 70–99)
Glucose-Capillary: 107 mg/dL — ABNORMAL HIGH (ref 70–99)

## 2014-06-05 MED ORDER — SODIUM CHLORIDE 0.9 % IV SOLN: 250.0000 mL | INTRAVENOUS | Status: DC | PRN

## 2014-06-05 MED ORDER — SODIUM CHLORIDE 0.9 % IV SOLN
INTRAVENOUS | Status: DC
  Administered 2014-06-05 – 2014-06-06 (×2): via INTRAVENOUS

## 2014-06-05 NOTE — Progress Notes (Signed)
Steven Riddle TEAM 1 - Stepdown/ICU TEAM Progress Note  Steven Riddle RUE:[id number] DOB: 1955/07/01 DOA: 05/29/2014 PCP: Romero Belling, Steven Riddle  Admit HPI / Brief Narrative: 59 yo [place] male PMHx anxiety, depression, anemia, HTN, multivessel CAD, systolic CHF, DM Type 1, Hyperlipidemia, s/p renal tranpslant in 2013 in [place]   Presented on 4/26 from his Endocrinologist's office with O2 sats 78% on room air. Noted one week of cough, white sputum, lower ext edema, with out fevers or chest pain. Noted to have + tropin's without acute EKG changes but he has Rt BBB. No prior cardiac history. CXR on admit 4/26 with B air space disease.   Following admission he remained on BIPAP >24 hours and was unable to come off. He was therefore moved to the ICU under the care of PCCM  HPI/Subjective: 5/3 A/O 4, states when ambulating today felt dizzy. RN reports that patient's SBP= 60. Notified cardiologist on call   Assessment/Plan: B airspace disease w/ acute hypoxic respiratory failure/CAP -Now on room air  - Completed 7 day course of cont empiric abx tx to complete 7 days of therapy  Chronic systolic heart failure -Baseline wgt appears to be ~65kg - weight 62.4 > 62.8 over last 24 hours  - Strict in and out; since admission -3.6 L     Renal transplant 2013 / Chronic immunosuppression -Cr within normal limit  - followed by [facility]  Hypokalemia  -Potassium goal > 4  -Recheck potassium and magnesium in a.m.   HTN  -BP currently reasonably controlled, will avoid adjusting medical tx until after cardiac cath   RBBB  Elevated troponin - 1.03>1.01>0.96 - Possible small non-Q-wave myocardial infarction - CAD  -history of possible silent anteroseptal wall MI in the past  - Cards planning for cardiac cath Thursday   DM Type 1 uncontrolled -4/26 HgA1c= 12.1   -Continue Levemir 15 units BID -Continue resistant SSI    Code Status: FULL Family Communication: no family present at  time of exam Disposition Plan: Per cardiology    Consultants: Cardiology - Dr. Sharyn Lull  Procedure/Significant Events: 4/27 emergently intubated  4/27 CVL L IJ 4/29 extubated    Culture   Antibiotics: 4/26 cefepime > stopped 5/3 4/28 diflucan > 4/26 vanc > stopped 5/3 4/28 Dapsone> 4/29   DVT prophylaxis: IV heparin   Devices NA   LINES / TUBES:  NA    Continuous Infusions: . sodium chloride 50 mL/hr at 06/04/14 1900    Objective: VITAL SIGNS: Temp: 98 F (36.7 C) (05/03 1245) Temp Source: Oral (05/03 1245) BP: 141/54 mmHg (05/03 1638) Pulse Rate: 72 (05/03 1638) SPO2; FIO2:   Intake/Output Summary (Last 24 hours) at 06/05/14 1653 Last data filed at 06/05/14 1600  Gross per 24 hour  Intake   1680 ml  Output    950 ml  Net    730 ml     Exam: General: A/O 4, NAD, No acute respiratory distress Lungs: Clear to auscultation bilaterally without wheezes or crackles Cardiovascular: Regular rate and rhythm without murmur gallop or rub normal S1 and S2 Abdomen: Nontender, nondistended, soft, bowel sounds positive, no rebound, no ascites, no appreciable mass Extremities: No significant cyanosis, clubbing, or edema bilateral lower extremities  Data Reviewed: Basic Metabolic Panel:  Recent Labs Lab 05/31/14 0515 06/01/14 0400 06/01/14 2012 06/02/14 0414 06/03/14 0506 06/04/14 0035 06/05/14 0305  NA 143 143 142 142 141 137 137  K 3.9 2.9* 3.1* 3.1* 2.8* 4.0 3.6  CL 108 104 102 101 102  103 102  CO2 GLUCOSE 198* 281* 146* 181* 131* 187* 335*  BUN 27* 42* 54* 59* 50* 36* 22*  CREATININE 1.11 1.35 1.41* 1.75* 1.33* 1.10 0.86  CALCIUM 8.6 9.0 8.6 8.9 8.4* 8.1* 8.0*  MG 1.5 1.8  --  1.9 2.0 1.7  --   PHOS 4.0 3.6  --  4.8*  --   --   --    Liver Function Tests:  Recent Labs Lab 05/29/14 2340 06/03/14 0506  AST 31 36  ALT 23 36  ALKPHOS 132* 100  BILITOT 1.1 1.2  PROT 7.2 6.0*  ALBUMIN 2.7* 2.0*   No results for  input(s): LIPASE, AMYLASE in the last 168 hours. No results for input(s): AMMONIA in the last 168 hours. CBC:  Recent Labs Lab 06/01/14 0400 06/02/14 0414 06/03/14 0506 06/04/14 0035 06/05/14 0305  WBC 17.5* 17.5* 13.8* 12.8* 10.4  HGB 12.4* 12.2* 11.2* 11.1* 11.0*  HCT 37.3* 37.0* 34.6* 34.8* 34.1*  MCV 85.6 85.6 85.6 87.4 86.5  PLT 372 342 326 289 284   Cardiac Enzymes:  Recent Labs Lab 05/29/14 2340 05/30/14 0331 05/30/14 1215 06/01/14 0400 06/03/14 0506  TROPONINI 1.03* 1.01* 0.96* 0.83* 0.97*   BNP (last 3 results)  Recent Labs  05/30/14 0412 06/01/14 0400 06/03/14 0504  BNP 1665.6* 872.3* 753.1*    ProBNP (last 3 results) No results for input(s): PROBNP in the last 8760 hours.  CBG:  Recent Labs Lab 06/04/14 0735 06/04/14 1653 06/04/14 2150 06/05/14 0816 06/05/14 1247  GLUCAP 85 135* 310* 243* 115*    Recent Results (from the past 240 hour(s))  Blood culture (routine x 2)     Status: None   Collection Time: 05/29/14  3:10 PM  Result Value Ref Range Status   Specimen Description BLOOD LEFT ARM  Final   Special Requests BOTTLES DRAWN AEROBIC AND ANAEROBIC  Final   Culture   Final    NO GROWTH 5 DAYS Performed at [facility]    Report Status 06/04/2014 FINAL  Final  Blood culture (routine x 2)     Status: None   Collection Time: 05/29/14  3:37 PM  Result Value Ref Range Status   Specimen Description BLOOD LEFT HAND  Final   Special Requests BOTTLES DRAWN AEROBIC AND ANAEROBIC 5CC  Final   Culture   Final    NO GROWTH 5 DAYS Performed at [facility]    Report Status 06/04/2014 FINAL  Final  AFB culture with smear     Status: None (Preliminary result)   Collection Time: 05/30/14  1:36 PM  Result Value Ref Range Status   Specimen Description BRONCHIAL ALVEOLAR LAVAGE  Final   Special Requests LEFT LOWER LOBE  Final   Acid Fast Smear   Final    NO ACID FAST BACILLI SEEN Performed at [facility]    Culture    Final    CULTURE WILL BE EXAMINED FOR 6 WEEKS BEFORE ISSUING A FINAL REPORT Performed at [facility]    Report Status PENDING  Incomplete  Fungus Culture with Smear     Status: None (Preliminary result)   Collection Time: 05/30/14  1:36 PM  Result Value Ref Range Status   Specimen Description BRONCHIAL ALVEOLAR LAVAGE  Final   Special Requests LEFT LOWER LOBE  Final   Fungal Smear   Final    NO YEAST OR FUNGAL ELEMENTS SEEN Performed at [facility]  Culture   Final    CULTURE IN PROGRESS FOR FOUR WEEKS Performed at [facility]    Report Status PENDING  Incomplete  Culture, bal-quantitative     Status: None   Collection Time: 05/30/14  1:36 PM  Result Value Ref Range Status   Specimen Description BRONCHIAL ALVEOLAR LAVAGE  Final   Special Requests LEFT LOWER LOBE  Final   Gram Stain   Final    FEW WBC PRESENT, PREDOMINANTLY PMN NO SQUAMOUS EPITHELIAL CELLS SEEN NO ORGANISMS SEEN Performed at [facility] NO GROWTH Performed at [facility]   Final   Culture   Final    NO GROWTH 2 DAYS Performed at [facility]    Report Status 06/01/2014 FINAL  Final  AFB culture with smear     Status: None (Preliminary result)   Collection Time: 05/30/14  1:39 PM  Result Value Ref Range Status   Specimen Description BRONCHIAL ALVEOLAR LAVAGE  Final   Special Requests RIGHT LOWER LOBE  Final   Acid Fast Smear   Final    NO ACID FAST BACILLI SEEN Performed at [facility]    Culture   Final    CULTURE WILL BE EXAMINED FOR 6 WEEKS BEFORE ISSUING A FINAL REPORT Performed at [facility]    Report Status PENDING  Incomplete  Culture, bal-quantitative     Status: None   Collection Time: 05/30/14  1:39 PM  Result Value Ref Range Status   Specimen Description BRONCHIAL ALVEOLAR LAVAGE  Final   Special Requests RIGHT LOWER LOBE  Final   Gram Stain   Final    MODERATE WBC PRESENT, PREDOMINANTLY  PMN NO SQUAMOUS EPITHELIAL CELLS SEEN NO ORGANISMS SEEN Performed at [facility] NO GROWTH Performed at [facility]   Final   Culture   Final    NO GROWTH 2 DAYS Performed at [facility]    Report Status 06/01/2014 FINAL  Final  Fungus Culture with Smear     Status: None (Preliminary result)   Collection Time: 05/30/14  1:39 PM  Result Value Ref Range Status   Specimen Description BRONCHIAL ALVEOLAR LAVAGE  Final   Special Requests RIGHT LOWER LOBE  Final   Fungal Smear   Final    NO YEAST OR FUNGAL ELEMENTS SEEN Performed at [facility]    Culture   Final    CULTURE IN PROGRESS FOR FOUR WEEKS Performed at [facility]    Report Status PENDING  Incomplete  MRSA PCR Screening     Status: None   Collection Time: 06/01/14  9:51 AM  Result Value Ref Range Status   MRSA by PCR NEGATIVE NEGATIVE Final    Comment:        The GeneXpert MRSA Assay (FDA approved for NASAL specimens only), is one component of a comprehensive MRSA colonization surveillance program. It is not intended to diagnose MRSA infection nor to guide or monitor treatment for MRSA infections.      Studies:  Recent x-ray studies have been reviewed in detail by the Attending Physician  Scheduled Meds:  Scheduled Meds: . aspirin  81 mg Oral Daily  . atorvastatin  40 mg Oral q1800  . carvedilol  3.125 mg Oral BID WC  . cycloSPORINE  100 mg Oral BID  . feeding supplement (GLUCERNA SHAKE)  237 mL Oral TID BM  . fluconazole  150 mg  Oral Daily  . insulin aspart  0-20 Units Subcutaneous TID WC  . insulin detemir  15 Units Subcutaneous BID  . isosorbide mononitrate  30 mg Oral Daily  . mycophenolate  180 mg Oral Daily  . prasugrel  10 mg Oral Daily  . predniSONE  5 mg Oral Q breakfast  . sodium chloride  3 mL Intravenous Q12H    Time spent on care of this patient: 40 mins   Steven Riddle, Steven Riddle , Steven Riddle  [facility] Office   [phone] Pager - [phone]  On-Call/Text Page:      [email or web address]      password [email or web address]  If 7PM-7AM, please contact night-coverage [email or web address] Password [email or web address] 06/05/2014, 4:53 PM   LOS: 7 days   Care during the described time interval was provided by me .  I have reviewed this patient's available data, including medical history, events of note, physical examination, radiology studies and test results as part of my evaluation  Steven Littlesurtis Jovaun Levene, Steven Riddle [phone] Pager

## 2014-06-05 NOTE — Progress Notes (Signed)
Complained of feeling weak while ambulating along the hallway with PT, BP- 60 systolic, no SOB nor dizziness noted, sat in a chair, instructed to call for help when needed, call light within reach.  Continue to monitor. BP- rechecked 149 systolic. Lying down

## 2014-06-05 NOTE — Progress Notes (Signed)
Dr.Harwani made aware about the BP systolic-60 and feeling weak  during ambulation. No order given. Continue to monitor.

## 2014-06-05 NOTE — Progress Notes (Signed)
Inpatient Diabetes Program Recommendations  AACE/ADA: New Consensus Statement on Inpatient Glycemic Control (2013)  Target Ranges:  Prepandial:   less than 140 mg/dL      Peak postprandial:   less than 180 mg/dL (1-2 hours)      Critically ill patients:  140 - 180 mg/dL   Results for Steven Riddle, Steven Riddle (MRN [id number]) as of 06/05/2014 09:42  Ref. Range 06/04/2014 07:35 06/04/2014 16:53 06/04/2014 21:50 06/05/2014 08:16  Glucose-Capillary Latest Ref Range: 70-99 mg/dL 85 409135 (H) 811310 (H) 914243 (H)   Current orders for Inpatient glycemic control: Levemir 15 units BID, Novolog 0-20 units TID with meals  Inpatient Diabetes Program Recommendations Insulin - Basal: Noted morning dose of Levemir was Not Given yesterday morning due to being NPO for procedure. As a result CBG up to 310 mg/dl at 78:2921:50 last night. Patient received Levemir 15 units last night at bedtime and fasting glucose is 243 mg/dl this morning. Correction (SSI): Please ordering Novolog bedtime correction scale.  Thanks, [name], RN, MSN, CCRN, CDE Diabetes Coordinator Inpatient Diabetes Program [phone] (Team Pager from 8am to 5pm) [phone] ([facility] office) [phone] [facility] office)

## 2014-06-05 NOTE — Progress Notes (Signed)
CARDIAC REHAB PHASE I   PRE:  Rate/Rhythm: 69 SR  BP:  Supine: 117/52  Sitting: 103/52    84/42  77/37  Standing: 63/30   To recliner at bedside  1345-1407 Came to try to walk with pt but BP too low. Had pt stand and BP to 63/30 and dizzy. Pt felt fine as long as he was sitting. Pulled recliner to bed and had pt sit to hopefully help with positional BP. Pt to call if he needs to go back to bed.  Gave pt some water to drink.         Luetta Nuttingharlene Mineola Duan, RN BSN  06/05/2014 2:02 PM   69 SR

## 2014-06-05 NOTE — Care Management Note (Signed)
Case Management Note  Patient Details  Name: Elliot Cousinmanullah Frid MRN: [id number] Date of Birth: Oct 31, 1955  Subjective/Objective:                    Action/Plan:   Expected Discharge Date:                  Expected Discharge Plan:  Home w Home Health Services  In-House Referral:     Discharge planning Services  CM Consult, Medication Assistance  Post Acute Care Choice:  Durable Medical Equipment, Home Health Choice offered to:  Spouse  DME Arranged:  Walker rolling with seat DME Agency:  [facility]  [facility]  [facility]  Status of Service:     Medicare Important Message Given:    Date Medicare IM Given:    Medicare IM give by:    Date Additional Medicare IM Given:    Additional Medicare Important Message give by:     If discussed at Long Length of Stay Meetings, dates discussed:    Additional Comments: 5/3 gave pt 30day free effient card.  Hanley Haysowell, Sakai Wolford T, RN 06/05/2014, 11:08 AM

## 2014-06-05 NOTE — Progress Notes (Signed)
Subjective:  Complains of feeling tired and fatigued noted to be hypotensive earlier today. Denies any chest pain or shortness of breath. Very concerned about the blockages and wanted to proceed with PCI while in the hospital.  Objective:  Vital Signs in the last 24 hours: Temp:  [97.9 F (36.6 C)-98.9 F (37.2 C)] 98.2 F (36.8 C) (05/03 0800) Pulse Rate:  [0-130] 71 (05/03 0835) Resp:  [0-24] 14 (05/03 0835) BP: (63-151)/(39-76) 149/61 mmHg (05/03 0835) SpO2:  [0 %-100 %] 93 % (05/03 0835) Weight:  [64.4 kg (141 lb 15.6 oz)] 64.4 kg (141 lb 15.6 oz) (05/03 0346)  Intake/Output from previous day: 05/02 0701 - 05/03 0700 In: 2576.2 [P.O.:240; I.V.:2086.2; IV Piggyback:250] Out: 1375 [Urine:1375] Intake/Output from this shift:    Physical Exam: Neck: no adenopathy, no carotid bruit, no JVD and supple, symmetrical, trachea midline Lungs: clear to auscultation bilaterally Heart: regular rate and rhythm, S1, S2 normal and Soft systolic murmur noted Abdomen: soft, non-tender; bowel sounds normal; no masses,  no organomegaly Extremities: extremities normal, atraumatic, no cyanosis or edema and Right groin dressing dry no hematoma  Lab Results:  Recent Labs  06/04/14 0035 06/05/14 0305  WBC 12.8* 10.4  HGB 11.1* 11.0*  PLT 289 284    Recent Labs  06/04/14 0035 06/05/14 0305  NA 137 137  K 4.0 3.6  CL 103 102  CO2 28 30  GLUCOSE 187* 335*  BUN 36* 22*  CREATININE 1.10 0.86    Recent Labs  06/03/14 0506  TROPONINI 0.97*   Hepatic Function Panel  Recent Labs  06/03/14 0506  PROT 6.0*  ALBUMIN 2.0*  AST 36  ALT 36  ALKPHOS 100  BILITOT 1.2   No results for input(s): CHOL in the last 72 hours. No results for input(s): PROTIME in the last 72 hours.  Imaging: Imaging results have been reviewed and No results found.  Cardiac Studies:  Assessment/Plan:   status post small non-Q-wave myocardial infarction status post left cath/PTCA stenting to proximal  left circumflex/obtuse marginal. With excellent angiographic  results. Multivessel CAD Status post bilateral pneumonia Status post acute respiratory failure Coronary artery disease history of possible silent anteroseptal wall MI in the past compensated systolic heart failure improved Hypertension Insulin requiring diabetes mellitus Peripheral neuropathy Anemia of chronic disease Depression Anxiety disorder End-stage renal disease status post renal transplant on chronic immunosuppressive therapy  Status post Acute renal injury secondary to hypovolemia/antibiotics Plan Continue present management Increase ambulation as tolerated Monitor renal function in a.m. Will schedule for PCI to LAD and RCA on Thursday   LOS: 7 days    [name], Pema Thomure 06/05/2014, 11:39 AM

## 2014-06-05 NOTE — Progress Notes (Signed)
Physical Therapy Treatment Patient Details Name: Steven Riddle MRN: [id number] DOB: 04/14/1955 Today's Date: 06/05/2014    History of Present Illness 59 y.o. male with hx of renal transplant 2013 admitted for airspace disease w/ acute hypoxic respiratory failure - Bil PNA, acute on chronic systolic heart failure, and small non-Q-wave myocardial infarction s/p PTCA cath 5/2    PT Comments    Pt moving well and eager to be OOB, walking, gaining strength to return to PLOF. Pt with presyncope with gait and unaware stating he was just weak with lack of insight into physiology and reports no lightheadedness. Pt notified of need for assist with all mobility and encourage to be in chair and continue HEP throughout the day. Will continue to follow.   BP 138/61 (89) supine Sitting after gait 90/52 (62) Standing 63/39 (45) HR 72-76 sats 96% on RA  Follow Up Recommendations  Home health PT;Supervision/Assistance - 24 hour     Equipment Recommendations  Rolling walker with 5" wheels    Recommendations for Other Services       Precautions / Restrictions Precautions Precautions: Other (comment) Precaution Comments: watch BP    Mobility  Bed Mobility Overal bed mobility: Modified Independent                Transfers Overall transfer level: Needs assistance   Transfers: Sit to/from Stand Sit to Stand: Supervision         General transfer comment: cues for hand placement, safety and lines  Ambulation/Gait Ambulation/Gait assistance: Min guard Ambulation Distance (Feet): 110 Feet Assistive device: Rolling walker (2 wheeled) Gait Pattern/deviations: Step-through pattern;Decreased stride length   Gait velocity interpretation: Below normal speed for age/gender General Gait Details: pt moving well with RW with cues for hand placement, position in RW and safety. After 110' pt started having sats drop to 86% with decreased responsiveness and pushing RW away despite multimodal  cues. Chair pulled to pt and BP in sitting 90/52 (62). On 2nd stand from chair BP 63/39 (45) with gait ceased and pt pushed in chair to room with return to bed for echo. RN present and aware   Stairs            Wheelchair Mobility    Modified Rankin (Stroke Patients Only)       Balance Overall balance assessment: Needs assistance   Sitting balance-Leahy Scale: Good       Standing balance-Leahy Scale: Poor                      Cognition Arousal/Alertness: Awake/alert Behavior During Therapy: WFL for tasks assessed/performed   Area of Impairment: Safety/judgement         Safety/Judgement: Decreased awareness of deficits          Exercises General Exercises - Lower Extremity Long Arc Quad: Strengthening;Both;Seated;20 reps;AROM Hip Flexion/Marching: AROM;Seated;Both;20 reps    General Comments        Pertinent Vitals/Pain Pain Assessment: No/denies pain    Home Living                      Prior Function            PT Goals (current goals can now be found in the care plan section) Progress towards PT goals: Progressing toward goals (limited by BP)    Frequency       PT Plan Current plan remains appropriate    Co-evaluation  End of Session   Activity Tolerance: Treatment limited secondary to medical complications (Comment) (hypotension) Patient left: in bed;with call bell/phone within reach     Time: 0740-0804 PT Time Calculation (min) (ACUTE ONLY): 24 min  Charges:  $Gait Training: 8-22 mins $Therapeutic Exercise: 8-22 mins                    G Codes:      Delorse Lek 06-15-2014, 8:14 AM Delaney Meigs, PT [phone]

## 2014-06-06 DIAGNOSIS — E876 Hypokalemia: Secondary | ICD-10-CM

## 2014-06-06 DIAGNOSIS — I951 Orthostatic hypotension: Secondary | ICD-10-CM

## 2014-06-06 LAB — MAGNESIUM: MAGNESIUM: 1.6 mg/dL — AB (ref 1.7–2.4)

## 2014-06-06 LAB — GLUCOSE, CAPILLARY
GLUCOSE-CAPILLARY: 189 mg/dL — AB (ref 70–99)
Glucose-Capillary: 122 mg/dL — ABNORMAL HIGH (ref 70–99)
Glucose-Capillary: 57 mg/dL — ABNORMAL LOW (ref 70–99)
Glucose-Capillary: 278 mg/dL — ABNORMAL HIGH (ref 70–99)
Glucose-Capillary: 107 mg/dL — ABNORMAL HIGH (ref 70–99)

## 2014-06-06 LAB — CBC
HEMATOCRIT: 37.5 % — AB (ref 39.0–52.0)
Hemoglobin: 12.1 g/dL — ABNORMAL LOW (ref 13.0–17.0)
MCH: 27.9 pg (ref 26.0–34.0)
MCHC: 32.3 g/dL (ref 30.0–36.0)
MCV: 86.4 fL (ref 78.0–100.0)
Platelets: 292 10*3/uL (ref 150–400)
RBC: 4.34 MIL/uL (ref 4.22–5.81)
RDW: 13.8 % (ref 11.5–15.5)
WBC: 13.7 10*3/uL — AB (ref 4.0–10.5)

## 2014-06-06 LAB — BASIC METABOLIC PANEL
Anion gap: 10 (ref 5–15)
BUN: 15 mg/dL (ref 6–20)
CALCIUM: 8.7 mg/dL — AB (ref 8.9–10.3)
CO2: 26 mmol/L (ref 22–32)
Chloride: 105 mmol/L (ref 101–111)
Creatinine, Ser: 0.84 mg/dL (ref 0.61–1.24)
GLUCOSE: 63 mg/dL — AB (ref 70–99)
Potassium: 3.4 mmol/L — ABNORMAL LOW (ref 3.5–5.1)
SODIUM: 141 mmol/L (ref 135–145)

## 2014-06-06 MED ORDER — ISOSORBIDE MONONITRATE ER 30 MG PO TB24
15.0000 mg | ORAL_TABLET | Freq: Every day | ORAL | Status: DC
  Filled 2014-06-06: qty 1

## 2014-06-06 MED ORDER — SODIUM CHLORIDE 0.9 % IV SOLN
INTRAVENOUS | Status: DC
  Administered 2014-06-06: 16:00:00 via INTRAVENOUS

## 2014-06-06 MED ORDER — SODIUM CHLORIDE 0.9 % IV BOLUS (SEPSIS)
500.0000 mL | Freq: Once | INTRAVENOUS | Status: AC
  Administered 2014-06-06: 500 mL via INTRAVENOUS

## 2014-06-06 NOTE — Progress Notes (Signed)
CARDIAC REHAB PHASE I   PRE:  Rate/Rhythm: 78 SR    BP: sitting 140/50, standing 60/39, sitting 97/45    SaO2: 98 RA  MODE:  Ambulation: 170 ft   POST:  Rate/Rhythm: 79 SR    BP: sitting 93/48     SaO2: 98 RA  Pt sts he is not having symptomatic orthostasis today however when he stood up he did not respond to questions temporarily. Had pt sit again and BP 60/39. Up to 97/45 after a few minutes. Pt recalls being slightly dizzy but not like he was yesterday. Pt then able to walk without rest for 1/2 lap around unit. Denies problems. BP stable after walk, return to recliner. Left ed materials for reading, will f/u Friday. [id number]  Elissa LovettReeve, Kareema Keitt Brook ForestKristan CES, ACSM 06/06/2014 2:28 PM

## 2014-06-06 NOTE — Progress Notes (Signed)
Subjective:  Patient denies any chest pain or shortness of breath.  Blood pressure better today.  Denies any dizziness or lightheadedness.  Objective:  Vital Signs in the last 24 hours: Temp:  [98 F (36.7 C)-98.5 F (36.9 C)] 98.5 F (36.9 C) (05/04 0817) Pulse Rate:  [68-79] 79 (05/04 0817) Resp:  [9-20] 16 (05/04 1000) BP: (104-150)/(46-64) 146/60 mmHg (05/04 0817) SpO2:  [93 %-98 %] 98 % (05/04 0817) Weight:  [63.6 kg (140 lb 3.4 oz)] 63.6 kg (140 lb 3.4 oz) (05/04 0400)  Intake/Output from previous day: 05/03 0701 - 05/04 0700 In: 1203 [P.O.:400; I.V.:703; IV Piggyback:100] Out: 1400 [Urine:1400] Intake/Output from this shift: Total I/O In: 160 [I.V.:160] Out: -   Physical Exam: Neck: no adenopathy, no carotid bruit, no JVD and supple, symmetrical, trachea midline Lungs: clear to auscultation bilaterally Heart: regular rate and rhythm, S1, S2 normal and soft systolic murmur noted Abdomen: soft, non-tender; bowel sounds normal; no masses,  no organomegaly Extremities: extremities normal, atraumatic, no cyanosis or edema and right groin stable  Lab Results:  Recent Labs  06/05/14 0305 06/06/14 0235  WBC 10.4 13.7*  HGB 11.0* 12.1*  PLT 284 292    Recent Labs  06/05/14 0305 06/06/14 0235  NA 137 141  K 3.6 3.4*  CL 102 105  CO2 30 26  GLUCOSE 335* 63*  BUN 22* 15  CREATININE 0.86 0.84   No results for input(s): TROPONINI in the last 72 hours.  Invalid input(s): CK, MB Hepatic Function Panel No results for input(s): PROT, ALBUMIN, AST, ALT, ALKPHOS, BILITOT, BILIDIR, IBILI in the last 72 hours. No results for input(s): CHOL in the last 72 hours. No results for input(s): PROTIME in the last 72 hours.  Imaging: Imaging results have been reviewed  Cardiac Studies:  Assessment/Plan:  status post small non-Q-wave myocardial infarction status post left cath/PTCA stenting to proximal left circumflex/obtuse marginal. With excellent angiographic  results. Multivessel CAD Status post bilateral pneumonia Status post acute respiratory failure Coronary artery disease history of possible silent anteroseptal wall MI in the past compensated systolic heart failure improved Hypertension Insulin requiring diabetes mellitus Peripheral neuropathy Anemia of chronic disease Depression Anxiety disorder End-stage renal disease status post renal transplant on chronic immunosuppressive therapy  Status post Acute renal injury secondary to hypovolemia/antibiotics Status post orthostatic hypotension Plan Continue present management Discussed with patient regarding PTCA stenting to RCA and LAD it's risk and benefits and consents for PCI.  Patient is scheduled for first case tomorrow  LOS: 8 days    [name], Glenis Musolf 06/06/2014, 10:46 AM

## 2014-06-06 NOTE — Care Management Note (Signed)
Case Management Note  Patient Details  Name: Steven Riddle MRN: [id number] Date of Birth: 04/02/1955  Subjective/Objective:                    Action/Plan:   Expected Discharge Date:                  Expected Discharge Plan:  Home w Home Health Services  In-House Referral:     Discharge planning Services  CM Consult, Medication Assistance  Post Acute Care Choice:  Durable Medical Equipment, Home Health Choice offered to:  Spouse  DME Arranged:  Walker rolling with seat DME Agency:  [facility]  [facility]  [facility]  Status of Service:     Medicare Important Message Given:    Date Medicare IM Given:    Medicare IM give by:    Date Additional Medicare IM Given:    Additional Medicare Important Message give by:     If discussed at Long Length of Stay Meetings, dates discussed:  06/07/14  Additional Comments:  Hanley HaysDowell, Kevionna Heffler T, RN 06/06/2014, 9:15 AM

## 2014-06-06 NOTE — Progress Notes (Signed)
[place] TEAM 1 - Stepdown/ICU TEAM Progress Note   Steven Riddle XBJ:[id number] DOB: 1955/08/22 DOA: 05/29/2014 PCP: Romero Belling, MD  Admit HPI / Brief Narrative: 59 yo male s/p renal tranpslant in 2013 in [place] who presented on 4/26 from his Endocrinologist's office with O2 sats 78% on room air. Noted one week of cough, white sputum, lower ext edema, with out fevers or chest pain. Noted to have + tropin's without acute EKG changes but he has Rt BBB. No prior cardiac history. CXR on admit 4/26 with B air space disease.     Following admission he remained on BIPAP >24 hours and was unable to come off.  He was therefore moved to the ICU under the care of PCCM.    Significant Events: 4/27 emergently intubated  4/27 CVL L IJ 4/29 extubated   HPI/Subjective: The patient is resting comfortably and has no complaints specifically denying chest pain fevers chills nausea vomiting and dizziness or lightheadedness.  Nonetheless when cardiac rehabilitation stood the patient and checked orthostatics his systolic pressure dropped from 120 to approximate 65 and he became dazed and minimally verbal for a matter of seconds.  He did not completely pass out but it sounds as if he was very close.  Assessment/Plan:  B airspace disease w/ acute hypoxic respiratory failure - B Community acquired PNA Has been successfully weaned to room air - has completed 7 days of empiric antibiotic therapy  Chronic systolic heart failure Baseline wgt appears to be ~65kg - weight currently 63.6 kg - remains net negative 3L since admit - hold diuretic and give modest volume bolus today  Severe orthostatic hypotension Markedly orthostatic today as discussed above - suspect this is related to modest volume depletion plus addition of nitrate - given known coronary disease pending intervention will continue nitrate for now and bolus with modest saline bolus - once coronary artery disease addressed may have to stop  nitrate  Renal transplant 2013 / Chronic immunosuppression Creatinine has returned to baseline with volume resuscitation - follow trend  Hypokalemia  Corrected - Mg acceptable   HTN  BP currently reasonably controlled    RBBB  Elevated troponin - 1.03>1.01>0.96 - Possible small non-Q-wave myocardial infarction - CAD  history of possible silent anteroseptal wall MI in the past - Cards planning for cardiac cath today   DM A1c 12.1 - CBG variable - no change in treatment today  Code Status: FULL Family Communication: no family present at time of exam Disposition Plan: SDU  Consultants: Cardiology - Dr. Sharyn Lull  Antibiotics: 4/26 cefepime > 5/2 4/28 diflucan > 4/26 vanc > 5/3 4/28 Dapsone> 4/29  DVT prophylaxis: SCDs  Objective: Blood pressure 93/48, pulse 75, temperature 98.8 F (37.1 C), temperature source Oral, resp. rate 17, height  (1.702 m), weight 63.6 kg (140 lb 3.4 oz), SpO2 95 %.  Intake/Output Summary (Last 24 hours) at 06/06/14 1426 Last data filed at 06/06/14 1400  Gross per 24 hour  Intake   1603 ml  Output   1400 ml  Net    203 ml   Exam: General: No acute respiratory distress - alert and conversant  Lungs: Clear to auscultation - no wheeze Cardiovascular: Regular rate and rhythm without murmur gallop rub  Abdomen: Nontender, nondistended, soft, bowel sounds positive, no rebound, no ascites, no appreciable mass Extremities: No significant cyanosis, clubbing, edema bilateral lower extremities  Data Reviewed: Basic Metabolic Panel:  Recent Labs Lab 05/31/14 0515 06/01/14 0400  06/02/14 0414 06/03/14 0506 06/04/14  1191 06/05/14 0305 06/06/14 0235  NA 143 143  < > 142 141 137 137 141  K 3.9 2.9*  < > 3.1* 2.8* 4.0 3.6 3.4*  CL 108 104  < > 101 102 103 102 105  CO2 25 27  < > GLUCOSE 198* 281*  < > 181* 131* 187* 335* 63*  BUN 27* 42*  < > 59* 50* 36* 22* 15  CREATININE 1.11 1.35  < > 1.75* 1.33* 1.10 0.86 0.84   CALCIUM 8.6 9.0  < > 8.9 8.4* 8.1* 8.0* 8.7*  MG 1.5 1.8  --  1.9 2.0 1.7  --  1.6*  PHOS 4.0 3.6  --  4.8*  --   --   --   --   < > = values in this interval not displayed.   CBC:  Recent Labs Lab 06/02/14 0414 06/03/14 0506 06/04/14 0035 06/05/14 0305 06/06/14 0235  WBC 17.5* 13.8* 12.8* 10.4 13.7*  HGB 12.2* 11.2* 11.1* 11.0* 12.1*  HCT 37.0* 34.6* 34.8* 34.1* 37.5*  MCV 85.6 85.6 87.4 86.5 86.4  PLT 342 326 289 284 292     Liver Function Tests:  Recent Labs Lab 06/03/14 0506  AST 36  ALT 36  ALKPHOS 100  BILITOT 1.2  PROT 6.0*  ALBUMIN 2.0*   Cardiac Enzymes:  Recent Labs Lab 06/01/14 0400 06/03/14 0506  TROPONINI 0.83* 0.97*   CBG:  Recent Labs Lab 06/05/14 1717 06/05/14 2152 06/06/14 0832 06/06/14 0927 06/06/14 1236  GLUCAP 109* 107* 57* 122* 278*    Recent Results (from the past 240 hour(s))  Blood culture (routine x 2)     Status: None   Collection Time: 05/29/14  3:10 PM  Result Value Ref Range Status   Specimen Description BLOOD LEFT ARM  Final   Special Requests BOTTLES DRAWN AEROBIC AND ANAEROBIC  Final   Culture   Final    NO GROWTH 5 DAYS Performed at [facility]    Report Status 06/04/2014 FINAL  Final  Blood culture (routine x 2)     Status: None   Collection Time: 05/29/14  3:37 PM  Result Value Ref Range Status   Specimen Description BLOOD LEFT HAND  Final   Special Requests BOTTLES DRAWN AEROBIC AND ANAEROBIC 5CC  Final   Culture   Final    NO GROWTH 5 DAYS Performed at [facility]    Report Status 06/04/2014 FINAL  Final  AFB culture with smear     Status: None (Preliminary result)   Collection Time: 05/30/14  1:36 PM  Result Value Ref Range Status   Specimen Description BRONCHIAL ALVEOLAR LAVAGE  Final   Special Requests LEFT LOWER LOBE  Final   Acid Fast Smear   Final    NO ACID FAST BACILLI SEEN Performed at [facility]    Culture   Final    CULTURE WILL BE EXAMINED FOR 6 WEEKS  BEFORE ISSUING A FINAL REPORT Performed at [facility]    Report Status PENDING  Incomplete  Fungus Culture with Smear     Status: None (Preliminary result)   Collection Time: 05/30/14  1:36 PM  Result Value Ref Range Status   Specimen Description BRONCHIAL ALVEOLAR LAVAGE  Final   Special Requests LEFT LOWER LOBE  Final   Fungal Smear   Final    NO YEAST OR FUNGAL ELEMENTS SEEN Performed at [facility]  Final    CULTURE IN PROGRESS FOR FOUR WEEKS [facility]formed [facility]    Report Status PENDING  Incomplete  Culture, bal-quantitative     Status: None   Collection Time: 05/30/14  1:36 PM  Result Value Ref Range Status   Specimen Description BRONCHIAL ALVEOLAR LAVAGE  Final   Special Requests LEFT LOWER LOBE  Final   Gram Stain   Final    FEW WBC PRESENT, PREDOMINANTLY PMN NO SQUAMOUS EPITHELIAL CELLS SEEN NO ORGANISMS SEEN [facility]formed [facility]    Colony Count NO GROWTH [facility]formed at [facility]   Final   Culture   Final    NO GROWTH 2 DAYS [facility]formed at [facility]    Report Status 06/01/2014 FINAL  Final  AFB culture with smear     Status: None (Preliminary result)   Collection Time: 05/30/14  1:39 PM  Result Value Ref Range Status   Specimen Description BRONCHIAL ALVEOLAR LAVAGE  Final   Special Requests RIGHT LOWER LOBE  Final   Acid Fast Smear   Final    NO ACID FAST BACILLI SEEN [facility]formed [facility]    Culture   Final    CULTURE WILL BE EXAMINED FOR 6 WEEKS BEFORE ISSUING A FINAL REPORT [facility]formed at [facility]    Report Status PENDING  Incomplete  Culture, bal-quantitative     Status: None   Collection Time: 05/30/14  1:39 PM  Result Value Ref Range Status   Specimen Description BRONCHIAL ALVEOLAR LAVAGE  Final   Special Requests RIGHT LOWER LOBE  Final   Gram Stain   Final    MODERATE WBC PRESENT, PREDOMINANTLY PMN NO SQUAMOUS EPITHELIAL CELLS SEEN NO ORGANISMS  SEEN Performed at [facility]    Colony Count NO GROWTH [facility] at [facility]   Final   Culture   Final    NO GROWTH 2 DAYS [facility]formed at [facility]    Report Status 06/01/2014 FINAL  Final  Fungus Culture with Smear     Status: None (Preliminary result)   Collection Time: 05/30/14  1:39 PM  Result Value Ref Range Status   Specimen Description BRONCHIAL ALVEOLAR LAVAGE  Final   Special Requests RIGHT LOWER LOBE  Final   Fungal Smear   Final    NO YEAST OR FUNGAL ELEMENTS SEEN [facility]formed at [facility]    Culture   Final    CULTURE IN PROGRESS FOR FOUR WEEKS [facility]formed at [facility]    Report Status PENDING  Incomplete  MRSA PCR Screening     Status: None   Collection Time: 06/01/14  9:51 AM  Result Value Ref Range Status   MRSA by PCR NEGATIVE NEGATIVE Final    Comment:        The GeneXpert MRSA Assay (FDA approved for NASAL specimens only), is one component of a comprehensive MRSA colonization surveillance program. It is not intended to diagnose MRSA infection nor to guide or monitor treatment for MRSA infections.      Studies:   Recent x-ray studies have been reviewed in detail by the Attending Physician  Scheduled Meds:  Scheduled Meds: . aspirin  81 mg Oral Daily  . atorvastatin  40 mg Oral q1800  . carvedilol  3.125 mg Oral BID WC  . cycloSPORINE  100 mg Oral BID  . feeding supplement (GLUCERNA SHAKE)  237 mL Oral TID BM  . fluconazole  150 mg Oral Daily  .  insulin aspart  0-20 Units Subcutaneous TID WC  . insulin detemir  15 Units Subcutaneous BID  . isosorbide mononitrate  30 mg Oral Daily  . mycophenolate  180 mg Oral Daily  . prasugrel  10 mg Oral Daily  . predniSONE  5 mg Oral Q breakfast  . sodium chloride  3 mL Intravenous Q12H    Time spent on care of this patient: 35 mins   Diondra Pines T , MD   [facility] Office  [phone] Pager - Text Page per Loretha StaplerAmion as per  below:  On-Call/Text Page:      [email or web address]      password [email or web address]  If 7PM-7AM, please contact night-coverage [email or web address] Password [email or web address] 06/06/2014, 2:26 PM   LOS: 8 days

## 2014-06-06 NOTE — Care Management Note (Signed)
Case Management Note  Patient Details  Name: Steven Riddle MRN: [id number] Date of Birth: 1955-08-11  Subjective/Objective:                    Action/Plan:   Expected Discharge Date:                  Expected Discharge Plan:  Home w Home Health Services  In-House Referral:     Discharge planning Services  CM Consult, Medication Assistance  Post Acute Care Choice:  Durable Medical Equipment, Home Health Choice offered to:  Spouse  DME Arranged:  Walker rolling with seat DME Agency:  [facility]  [facility]  [facility]  Status of Service:     Medicare Important Message Given:    Date Medicare IM Given:    Medicare IM give by:    Date Additional Medicare IM Given:    Additional Medicare Important Message give by:     If discussed at Long Length of Stay Meetings, dates discussed:  06/07/14 06/05/2014 Called INVISION at [phone]. [facility]ed to [facility]. EFFIENT is covered, but PRIOR AUTHORIZATION is required. PRIOR AUTHORIZATION Phone Line is [phone]. Rene KocherRegina stated that she will not know the co-payment amount for this medication until the Prior Authorization is approved. Rene KocherRegina did quote the following Tier co-payments for this insurance. They are as follows: Tier 1 Medications -$4.00, Tier 2 - $15.00, Tier 3 - $45.00, Tier 4 - $85.00, Tier 5 - 33% of total cost. Thank You!!!    [facility]. Additional Comments:  Will need prior auth for effient  Hanley HaysDowell, Rushawn Capshaw T, RN 06/06/2014, 8:40 AM

## 2014-06-06 NOTE — Progress Notes (Addendum)
CRITICAL VALUE ALERT  Critical value received:  Blood Sugar 57  Date of notification:  06/06/2014  Time of notification:  0830  Critical value read back:Yes.    Nurse who received alert:  Malachi ProKristina Embrie Mikkelsen RN  8oz of juice provided and now eating breakfast tray. Will recheck in 15 minutes  Blood sugar rechecked value of 127

## 2014-06-07 ENCOUNTER — Encounter (HOSPITAL_COMMUNITY): Payer: Self-pay | Admitting: Cardiology

## 2014-06-07 ENCOUNTER — Ambulatory Visit (HOSPITAL_COMMUNITY): Admission: RE | Admit: 2014-06-07 | Payer: PPO | Source: Ambulatory Visit | Admitting: Cardiology

## 2014-06-07 ENCOUNTER — Encounter (HOSPITAL_COMMUNITY)
Admission: EM | Disposition: A | Discharge status: Home Health | Payer: PPO | Source: Home / Self Care | Attending: Internal Medicine

## 2014-06-07 ENCOUNTER — Other Ambulatory Visit: Payer: Self-pay

## 2014-06-07 DIAGNOSIS — I214 Non-ST elevation (NSTEMI) myocardial infarction: Secondary | ICD-10-CM | POA: Diagnosis present

## 2014-06-07 DIAGNOSIS — Z940 Kidney transplant status: Secondary | ICD-10-CM | POA: Diagnosis present

## 2014-06-07 DIAGNOSIS — I5022 Chronic systolic (congestive) heart failure: Secondary | ICD-10-CM

## 2014-06-07 HISTORY — PX: CARDIAC CATHETERIZATION: SHX172

## 2014-06-07 LAB — MAGNESIUM: Magnesium: 1.5 mg/dL — ABNORMAL LOW (ref 1.7–2.4)

## 2014-06-07 LAB — BASIC METABOLIC PANEL
Anion gap: 6 (ref 5–15)
BUN: 16 mg/dL (ref 6–20)
CO2: 26 mmol/L (ref 22–32)
Calcium: 8.3 mg/dL — ABNORMAL LOW (ref 8.9–10.3)
Chloride: 107 mmol/L (ref 101–111)
Creatinine, Ser: 0.74 mg/dL (ref 0.61–1.24)
GFR calc Af Amer: >60 mL/min (ref >60)
GFR calc non Af Amer: >60 mL/min (ref >60)
GLUCOSE: 67 mg/dL — AB (ref 70–99)
POTASSIUM: 3.7 mmol/L (ref 3.5–5.1)
Sodium: 139 mmol/L (ref 135–145)

## 2014-06-07 LAB — GLUCOSE, CAPILLARY
GLUCOSE-CAPILLARY: 232 mg/dL — AB (ref 70–99)
Glucose-Capillary: 73 mg/dL (ref 70–99)
Glucose-Capillary: 173 mg/dL — ABNORMAL HIGH (ref 70–99)
Glucose-Capillary: 25 mg/dL — CL (ref 70–99)
Glucose-Capillary: 105 mg/dL — ABNORMAL HIGH (ref 70–99)
Glucose-Capillary: 167 mg/dL — ABNORMAL HIGH (ref 70–99)

## 2014-06-07 LAB — CBC
HEMATOCRIT: 34.2 % — AB (ref 39.0–52.0)
Hemoglobin: 11.1 g/dL — ABNORMAL LOW (ref 13.0–17.0)
MCH: 27.8 pg (ref 26.0–34.0)
MCHC: 32.5 g/dL (ref 30.0–36.0)
MCV: 85.7 fL (ref 78.0–100.0)
Platelets: 269 10*3/uL (ref 150–400)
RBC: 3.99 MIL/uL — ABNORMAL LOW (ref 4.22–5.81)
RDW: 14 % (ref 11.5–15.5)
WBC: 13.2 10*3/uL — ABNORMAL HIGH (ref 4.0–10.5)

## 2014-06-07 SURGERY — CORONARY STENT INTERVENTION

## 2014-06-07 MED ORDER — ATORVASTATIN CALCIUM 40 MG PO TABS
40.0000 mg | ORAL_TABLET | Freq: Every day | ORAL | Status: DC
Start: 2014-06-08 — End: 2014-06-08

## 2014-06-07 MED ORDER — LIDOCAINE HCL (PF) 1 % IJ SOLN
INTRAMUSCULAR | Status: AC
  Filled 2014-06-07: qty 30

## 2014-06-07 MED ORDER — BIVALIRUDIN BOLUS VIA INFUSION
INTRAVENOUS | Status: DC | PRN
Start: 2014-06-07 — End: 2014-06-07
  Administered 2014-06-07: 6.35 mg via INTRAVENOUS

## 2014-06-07 MED ORDER — SODIUM CHLORIDE 0.9 % IV SOLN
INTRAVENOUS | Status: AC
  Administered 2014-06-07: 17:00:00 via INTRAVENOUS

## 2014-06-07 MED ORDER — ONDANSETRON HCL 4 MG/2ML IJ SOLN: 4.0000 mg | Freq: Four times a day (QID) | INTRAMUSCULAR | Status: DC | PRN

## 2014-06-07 MED ORDER — DEXTROSE 50 % IV SOLN
INTRAVENOUS | Status: DC | PRN
  Administered 2014-06-07: 50 mL via INTRAVENOUS

## 2014-06-07 MED ORDER — PRASUGREL HCL 10 MG PO TABS: 10.0000 mg | ORAL_TABLET | Freq: Every day | ORAL | Status: DC

## 2014-06-07 MED ORDER — SODIUM CHLORIDE 0.9 % IV SOLN
1.7500 mg/kg/h | INTRAVENOUS | Status: DC
  Filled 2014-06-07: qty 250

## 2014-06-07 MED ORDER — BIVALIRUDIN 250 MG IV SOLR
1.7500 mg/kg/h | INTRAVENOUS | Status: AC
  Filled 2014-06-07: qty 250

## 2014-06-07 MED ORDER — NITROGLYCERIN IN D5W 200-5 MCG/ML-% IV SOLN: 5.0000 ug/min | INTRAVENOUS | Status: DC

## 2014-06-07 MED ORDER — NITROGLYCERIN 0.2 MG/ML ON CALL CATH LAB
INTRAVENOUS | Status: DC | PRN
Start: 2014-06-07 — End: 2014-06-07
  Administered 2014-06-07 (×2): 150 ug via INTRAVENOUS

## 2014-06-07 MED ORDER — SODIUM CHLORIDE 0.9 % WEIGHT BASED INFUSION: 1.0000 mL/kg/h | INTRAVENOUS | Status: DC

## 2014-06-07 MED ORDER — FENTANYL CITRATE (PF) 100 MCG/2ML IJ SOLN
INTRAMUSCULAR | Status: DC | PRN
  Administered 2014-06-07: 25 ug via INTRAVENOUS

## 2014-06-07 MED ORDER — SODIUM CHLORIDE 0.9 % IJ SOLN: 3.0000 mL | INTRAMUSCULAR | Status: DC | PRN

## 2014-06-07 MED ORDER — SODIUM CHLORIDE 0.9 % IJ SOLN
10.0000 mL | INTRAMUSCULAR | Status: DC | PRN
  Administered 2014-06-07: 20 mL
  Filled 2014-06-07: qty 40

## 2014-06-07 MED ORDER — SODIUM CHLORIDE 0.9 % IV SOLN: 250.0000 mL | INTRAVENOUS | Status: DC | PRN

## 2014-06-07 MED ORDER — BIVALIRUDIN 250 MG IV SOLR
INTRAVENOUS | Status: AC
  Filled 2014-06-07: qty 250

## 2014-06-07 MED ORDER — SODIUM CHLORIDE 0.9 % IJ SOLN
3.0000 mL | Freq: Two times a day (BID) | INTRAMUSCULAR | Status: DC
  Administered 2014-06-07 (×2): 3 mL via INTRAVENOUS

## 2014-06-07 MED ORDER — ASPIRIN 81 MG PO CHEW
81.0000 mg | CHEWABLE_TABLET | ORAL | Status: AC
  Administered 2014-06-07: 81 mg via ORAL
  Filled 2014-06-07: qty 1

## 2014-06-07 MED ORDER — FENTANYL CITRATE (PF) 100 MCG/2ML IJ SOLN
INTRAMUSCULAR | Status: AC
  Filled 2014-06-07: qty 2

## 2014-06-07 MED ORDER — SODIUM CHLORIDE 0.9 % IV SOLN
INTRAVENOUS | Status: DC | PRN
  Administered 2014-06-07: 999 mL/h via INTRAVENOUS

## 2014-06-07 MED ORDER — HEPARIN (PORCINE) IN NACL 2-0.9 UNIT/ML-% IJ SOLN
INTRAMUSCULAR | Status: AC
  Filled 2014-06-07: qty 1500

## 2014-06-07 MED ORDER — MIDAZOLAM HCL 2 MG/2ML IJ SOLN
INTRAMUSCULAR | Status: DC | PRN
  Administered 2014-06-07: 1 mg via INTRAVENOUS

## 2014-06-07 MED ORDER — SODIUM CHLORIDE 0.9 % IJ SOLN: 3.0000 mL | Freq: Two times a day (BID) | INTRAMUSCULAR | Status: DC

## 2014-06-07 MED ORDER — MIDAZOLAM HCL 2 MG/2ML IJ SOLN
INTRAMUSCULAR | Status: AC
  Filled 2014-06-07: qty 2

## 2014-06-07 MED ORDER — SODIUM CHLORIDE 0.9 % IV SOLN
250.0000 mg | INTRAVENOUS | Status: DC | PRN
  Administered 2014-06-07: 1 mg/kg/h via INTRAVENOUS

## 2014-06-07 MED ORDER — DEXTROSE 5 % IV SOLN
INTRAVENOUS | Status: DC | PRN
  Administered 2014-06-07: 150 mL/h via INTRAVENOUS

## 2014-06-07 MED ORDER — ATORVASTATIN CALCIUM 40 MG PO TABS: 80.0000 mg | ORAL_TABLET | Freq: Every day | ORAL | Status: DC

## 2014-06-07 MED ORDER — NITROGLYCERIN 1 MG/10 ML FOR IR/CATH LAB
INTRA_ARTERIAL | Status: AC
  Filled 2014-06-07: qty 10

## 2014-06-07 MED ORDER — ROSUVASTATIN CALCIUM 5 MG PO TABS: 5.0000 mg | ORAL_TABLET | Freq: Every day | ORAL | Status: DC

## 2014-06-07 MED ORDER — SODIUM CHLORIDE 0.9 % IV SOLN: INTRAVENOUS | Status: DC

## 2014-06-07 MED ORDER — IOHEXOL 350 MG/ML SOLN
INTRAVENOUS | Status: DC | PRN
Start: 2014-06-07 — End: 2014-06-07
  Administered 2014-06-07: 185 mL via INTRACARDIAC

## 2014-06-07 MED ORDER — PRASUGREL HCL 10 MG PO TABS
ORAL_TABLET | ORAL | Status: AC
  Filled 2014-06-07: qty 3

## 2014-06-07 MED ORDER — NITROGLYCERIN IN D5W 200-5 MCG/ML-% IV SOLN
INTRAVENOUS | Status: AC
  Filled 2014-06-07: qty 250

## 2014-06-07 MED ORDER — PRASUGREL HCL 10 MG PO TABS
ORAL_TABLET | ORAL | Status: DC | PRN
Start: 2014-06-07 — End: 2014-06-07
  Administered 2014-06-07: 30 mg via ORAL

## 2014-06-07 MED ORDER — ATORVASTATIN CALCIUM 40 MG PO TABS
40.0000 mg | ORAL_TABLET | Freq: Every day | ORAL | Status: DC
  Administered 2014-06-07: 16:00:00 40 mg via ORAL

## 2014-06-07 MED ORDER — INSULIN DETEMIR 100 UNIT/ML ~~LOC~~ SOLN
10.0000 [IU] | Freq: Two times a day (BID) | SUBCUTANEOUS | Status: DC
  Administered 2014-06-07 – 2014-06-08 (×2): 10 [IU] via SUBCUTANEOUS
  Filled 2014-06-07 (×3): qty 0.1

## 2014-06-07 MED ORDER — NITROGLYCERIN IN D5W 200-5 MCG/ML-% IV SOLN
INTRAVENOUS | Status: DC | PRN
  Administered 2014-06-07: 5 ug/min via INTRAVENOUS

## 2014-06-07 MED ORDER — SODIUM CHLORIDE 0.9 % WEIGHT BASED INFUSION
3.0000 mL/kg/h | INTRAVENOUS | Status: DC
  Administered 2014-06-07: 3 mL/kg/h via INTRAVENOUS

## 2014-06-07 MED ORDER — ACETAMINOPHEN 325 MG PO TABS: 650.0000 mg | ORAL_TABLET | ORAL | Status: DC | PRN

## 2014-06-07 SURGICAL SUPPLY — 18 items
BALLN EMERGE MR 2.5X12 (BALLOONS) ×6
BALLN ~~LOC~~ EMERGE MR 2.75X20 (BALLOONS) ×3
BALLN ~~LOC~~ EMERGE MR 3.0X20 (BALLOONS) ×3
BALLOON EMERGE MR 2.5X12 (BALLOONS) IMPLANT
BALLOON ~~LOC~~ EMERGE MR 2.75X20 (BALLOONS) IMPLANT
BALLOON ~~LOC~~ EMERGE MR 3.0X20 (BALLOONS) IMPLANT
GUIDE CATH RUNWAY 6FR VL3 (CATHETERS) ×2 IMPLANT
KIT ENCORE 26 ADVANTAGE (KITS) ×3 IMPLANT
KIT HEART LEFT (KITS) ×2 IMPLANT
PACK CARDIAC CATHETERIZATION (CUSTOM PROCEDURE TRAY) ×2 IMPLANT
SHEATH PINNACLE 6F 10CM (SHEATH) ×2 IMPLANT
STENT XIENCE ALPINE RX 2.5X23 (Permanent Stent) ×2 IMPLANT
STENT XIENCE ALPINE RX 2.75X33 (Permanent Stent) ×2 IMPLANT
TRANSDUCER W/STOPCOCK (MISCELLANEOUS) ×2 IMPLANT
TUBING CIL FLEX 10 FLL-RA (TUBING) ×3 IMPLANT
WIRE EMERALD 3MM-J .035X150CM (WIRE) ×2 IMPLANT
WIRE PT2 MS 185 (WIRE) ×2 IMPLANT
WIRE RUNTHROUGH .014X180CM (WIRE) ×2 IMPLANT

## 2014-06-07 NOTE — Progress Notes (Signed)
Site area: right groin  Site Prior to Removal:  Level 0  Pressure Applied For 20 MINUTES    Minutes Beginning at 1425  Manual:   Yes.    Patient Status During Pull:  AAO X 4  Post Pull Groin Site:  Level 0  Post Pull Instructions Given:  Yes.    Post Pull Pulses Present:  Yes.    Dressing Applied:  Yes.    Comments:  Tolerated procedure well 

## 2014-06-07 NOTE — H&P (View-Only) (Signed)
Subjective:  Patient denies any chest pain or shortness of breath.  Blood pressure better today.  Denies any dizziness or lightheadedness.  Objective:  Vital Signs in the last 24 hours: Temp:  [98 F (36.7 C)-98.5 F (36.9 C)] 98.5 F (36.9 C) (05/04 0817) Pulse Rate:  [68-79] 79 (05/04 0817) Resp:  [9-20] 16 (05/04 1000) BP: (104-150)/(46-64) 146/60 mmHg (05/04 0817) SpO2:  [93 %-98 %] 98 % (05/04 0817) Weight:  [63.6 kg (140 lb 3.4 oz)] 63.6 kg (140 lb 3.4 oz) (05/04 0400)  Intake/Output from previous day: 05/03 0701 - 05/04 0700 In: 1203 [P.O.:400; I.V.:703; IV Piggyback:100] Out: 1400 [Urine:1400] Intake/Output from this shift: Total I/O In: 160 [I.V.:160] Out: -   Physical Exam: Neck: no adenopathy, no carotid bruit, no JVD and supple, symmetrical, trachea midline Lungs: clear to auscultation bilaterally Heart: regular rate and rhythm, S1, S2 normal and soft systolic murmur noted Abdomen: soft, non-tender; bowel sounds normal; no masses,  no organomegaly Extremities: extremities normal, atraumatic, no cyanosis or edema and right groin stable  Lab Results:  Recent Labs  06/05/14 0305 06/06/14 0235  WBC 10.4 13.7*  HGB 11.0* 12.1*  PLT 284 292    Recent Labs  06/05/14 0305 06/06/14 0235  NA 137 141  K 3.6 3.4*  CL 102 105  CO2 30 26  GLUCOSE 335* 63*  BUN 22* 15  CREATININE 0.86 0.84   No results for input(s): TROPONINI in the last 72 hours.  Invalid input(s): CK, MB Hepatic Function Panel No results for input(s): PROT, ALBUMIN, AST, ALT, ALKPHOS, BILITOT, BILIDIR, IBILI in the last 72 hours. No results for input(s): CHOL in the last 72 hours. No results for input(s): PROTIME in the last 72 hours.  Imaging: Imaging results have been reviewed  Cardiac Studies:  Assessment/Plan:  status post small non-Q-wave myocardial infarction status post left cath/PTCA stenting to proximal left circumflex/obtuse marginal. With excellent angiographic  results. Multivessel CAD Status post bilateral pneumonia Status post acute respiratory failure Coronary artery disease history of possible silent anteroseptal wall MI in the past compensated systolic heart failure improved Hypertension Insulin requiring diabetes mellitus Peripheral neuropathy Anemia of chronic disease Depression Anxiety disorder End-stage renal disease status post renal transplant on chronic immunosuppressive therapy  Status post Acute renal injury secondary to hypovolemia/antibiotics Status post orthostatic hypotension Plan Continue present management Discussed with patient regarding PTCA stenting to RCA and LAD it's risk and benefits and consents for PCI.  Patient is scheduled for first case tomorrow  LOS: 8 days    Steven Riddle 06/06/2014, 10:46 AM    

## 2014-06-07 NOTE — Progress Notes (Signed)
[facility] TEAM 1 - Stepdown/ICU TEAM Progress Note  Jonathyn Carothers ZOX:[id number] DOB: 09-Jun-1955 DOA: 05/29/2014 PCP: Romero Belling, MD  Admit HPI / Brief Narrative: 59 yo [place] male PMHx anxiety, depression, anemia, HTN, multivessel CAD, systolic CHF, DM Type 1, Hyperlipidemia, s/p renal tranpslant in 2013 in [place]   Presented on 4/26 from his Endocrinologist's office with O2 sats 78% on room air. Noted one week of cough, white sputum, lower ext edema, with out fevers or chest pain. Noted to have + tropin's without acute EKG changes but he has Rt BBB. No prior cardiac history. CXR on admit 4/26 with B air space disease.   Following admission he remained on BIPAP >24 hours and was unable to come off. He was therefore moved to the ICU under the care of PCCM  HPI/Subjective: 5/4 A/O 4, S/P cardiac catheterization, negative CP, negative SOB, negative N/V   Assessment/Plan: B airspace disease w/ acute hypoxic respiratory failure/CAP -Now on room air  - Completed 7 day course of cont empiric abx tx to complete 7 days of therapy  Chronic systolic heart failure -Baseline wgt appears to be ~65kg - weight 62.4 > 62.8 over last 24 hours  - Strict in and out; since admission - 2.9 L     Renal transplant 2013 / Chronic immunosuppression -Cr within normal limit  - followed by [facility]  Hypokalemia  -Potassium goal > 4  -Recheck potassium and magnesium in a.m.   HTN  -BP currently reasonably controlled, will avoid adjusting medical tx until a.m., currently still on nitroglycerin drip.  RBBB  Elevated troponin - 1.03>1.01>0.96 - Possible small non-Q-wave myocardial infarction - CAD  -history of possible silent anteroseptal wall MI in the past  - S/P cardiac cath;  cath/PTCA stenting to proximal left circumflex/obtuse marginal  DM Type 1 uncontrolled -4/26 HgA1c= 12.1   -Continue Levemir 10 units BID -Continue resistant SSI    Code Status: FULL Family  Communication: no family present at time of exam Disposition Plan: Per cardiology    Consultants: Cardiology - Dr. Sharyn Lull  Procedure/Significant Events: 4/27 emergently intubated  4/27 CVL L IJ 4/29 extubated  5/5 cardiac catheterization;cath/PTCA stenting to proximal left circumflex/obtuse marginal  Culture   Antibiotics: 4/26 cefepime > stopped 5/3 4/28 diflucan > 4/26 vanc > stopped 5/3 4/28 Dapsone> 4/29   DVT prophylaxis: IV heparin   Devices NA   LINES / TUBES:  NA    Continuous Infusions: . sodium chloride Stopped (06/07/14 0535)  . sodium chloride 1 mL/kg/hr (06/07/14 8119)    Objective: VITAL SIGNS: Temp: 98.7 F (37.1 C) (05/05 0453) Temp Source: Oral (05/05 0453) BP: 161/65 mmHg (05/05 0453) Pulse Rate: 80 (05/05 0453) SPO2; FIO2:   Intake/Output Summary (Last 24 hours) at 06/07/14 0804 Last data filed at 06/07/14 0547  Gross per 24 hour  Intake 2232.5 ml  Output    800 ml  Net 1432.5 ml     Exam: General: A/O 4, NAD, No acute respiratory distress Lungs: Clear to auscultation bilaterally without wheezes or crackles Cardiovascular: Regular rate and rhythm without murmur gallop or rub normal S1 and S2 Abdomen: Nontender, nondistended, soft, bowel sounds positive, no rebound, no ascites, no appreciable mass Extremities: No significant cyanosis, clubbing, or edema bilateral lower extremities  Data Reviewed: Basic Metabolic Panel:  Recent Labs Lab 06/01/14 0400  06/02/14 0414 06/03/14 0506 06/04/14 0035 06/05/14 0305 06/06/14 0235 06/07/14 0530  NA 143  < > 142 141 137 137 141 139  K 2.9*  < >  3.1* 2.8* 4.0 3.6 3.4* 3.7  CL 104  < > 101 102 103 102 105 107  CO2 27  < > 28 29 28 30 26 26   GLUCOSE 281*  < > 181* 131* 187* 335* 63* 67*  BUN 42*  < > 59* 50* 36* 22* 15 16  CREATININE 1.35  < > 1.75* 1.33* 1.10 0.86 0.84 0.74  CALCIUM 9.0  < > 8.9 8.4* 8.1* 8.0* 8.7* 8.3*  MG 1.8  --  1.9 2.0 1.7  --  1.6* 1.5*  PHOS 3.6  --   4.8*  --   --   --   --   --   < > = values in this interval not displayed. Liver Function Tests:  Recent Labs Lab 06/03/14 0506  AST 36  ALT 36  ALKPHOS 100  BILITOT 1.2  PROT 6.0*  ALBUMIN 2.0*   No results for input(s): LIPASE, AMYLASE in the last 168 hours. No results for input(s): AMMONIA in the last 168 hours. CBC:  Recent Labs Lab 06/03/14 0506 06/04/14 0035 06/05/14 0305 06/06/14 0235 06/07/14 0530  WBC 13.8* 12.8* 10.4 13.7* 13.2*  HGB 11.2* 11.1* 11.0* 12.1* 11.1*  HCT 34.6* 34.8* 34.1* 37.5* 34.2*  MCV 85.6 87.4 86.5 86.4 85.7  PLT 326 289 284 292 269   Cardiac Enzymes:  Recent Labs Lab 06/01/14 0400 06/03/14 0506  TROPONINI 0.83* 0.97*   BNP (last 3 results)  Recent Labs  05/30/14 0412 06/01/14 0400 06/03/14 0504  BNP 1665.6* 872.3* 753.1*    ProBNP (last 3 results) No results for input(s): PROBNP in the last 8760 hours.  CBG:  Recent Labs Lab 06/06/14 0927 06/06/14 1236 06/06/14 1702 06/06/14 2126 06/07/14 0533  GLUCAP 122* 278* 189* 107* 73    Recent Results (from the past 240 hour(s))  Blood culture (routine x 2)     Status: None   Collection Time: 05/29/14  3:10 PM  Result Value Ref Range Status   Specimen Description BLOOD LEFT ARM  Final   Special Requests BOTTLES DRAWN AEROBIC AND ANAEROBIC 5ML  Final   Culture   Final    NO GROWTH 5 DAYS Performed at [facility]    Report Status 06/04/2014 FINAL  Final  Blood culture (routine x 2)     Status: None   Collection Time: 05/29/14  3:37 PM  Result Value Ref Range Status   Specimen Description BLOOD LEFT HAND  Final   Special Requests BOTTLES DRAWN AEROBIC AND ANAEROBIC 5CC  Final   Culture   Final    NO GROWTH 5 DAYS Performed at [facility]    Report Status 06/04/2014 FINAL  Final  AFB culture with smear     Status: None (Preliminary result)   Collection Time: 05/30/14  1:36 PM  Result Value Ref Range Status   Specimen Description BRONCHIAL  ALVEOLAR LAVAGE  Final   Special Requests LEFT LOWER LOBE  Final   Acid Fast Smear   Final    NO ACID FAST BACILLI SEEN Performed at [facility]    Culture   Final    CULTURE WILL BE EXAMINED FOR 6 WEEKS BEFORE ISSUING A FINAL REPORT Performed at [facility]    Report Status PENDING  Incomplete  Fungus Culture with Smear     Status: None (Preliminary result)   Collection Time: 05/30/14  1:36 PM  Result Value Ref Range Status   Specimen Description BRONCHIAL ALVEOLAR LAVAGE  Final  Special Requests LEFT LOWER LOBE  Final   Fungal Smear   Final    NO YEAST OR FUNGAL ELEMENTS SEEN Performed at [facility]    Culture   Final    CULTURE IN PROGRESS FOR FOUR WEEKS Performed at [facility]    Report Status PENDING  Incomplete  Culture, bal-quantitative     Status: None   Collection Time: 05/30/14  1:36 PM  Result Value Ref Range Status   Specimen Description BRONCHIAL ALVEOLAR LAVAGE  Final   Special Requests LEFT LOWER LOBE  Final   Gram Stain   Final    FEW WBC PRESENT, PREDOMINANTLY PMN NO SQUAMOUS EPITHELIAL CELLS SEEN NO ORGANISMS SEEN Performed at [facility] NO GROWTH Performed at [facility]   Final   Culture   Final    NO GROWTH 2 DAYS Performed at [facility]    Report Status 06/01/2014 FINAL  Final  AFB culture with smear     Status: None (Preliminary result)   Collection Time: 05/30/14  1:39 PM  Result Value Ref Range Status   Specimen Description BRONCHIAL ALVEOLAR LAVAGE  Final   Special Requests RIGHT LOWER LOBE  Final   Acid Fast Smear   Final    NO ACID FAST BACILLI SEEN Performed at [facility]    Culture   Final    CULTURE WILL BE EXAMINED FOR 6 WEEKS BEFORE ISSUING A FINAL REPORT Performed at [facility]    Report Status PENDING  Incomplete  Culture, bal-quantitative     Status: None   Collection Time: 05/30/14  1:39 PM  Result Value Ref Range  Status   Specimen Description BRONCHIAL ALVEOLAR LAVAGE  Final   Special Requests RIGHT LOWER LOBE  Final   Gram Stain   Final    MODERATE WBC PRESENT, PREDOMINANTLY PMN NO SQUAMOUS EPITHELIAL CELLS SEEN NO ORGANISMS SEEN Performed at [facility] NO GROWTH Performed at [facility]   Final   Culture   Final    NO GROWTH 2 DAYS Performed at [facility]    Report Status 06/01/2014 FINAL  Final  Fungus Culture with Smear     Status: None (Preliminary result)   Collection Time: 05/30/14  1:39 PM  Result Value Ref Range Status   Specimen Description BRONCHIAL ALVEOLAR LAVAGE  Final   Special Requests RIGHT LOWER LOBE  Final   Fungal Smear   Final    NO YEAST OR FUNGAL ELEMENTS SEEN Performed at [facility]    Culture   Final    CULTURE IN PROGRESS FOR FOUR WEEKS Performed at [facility]    Report Status PENDING  Incomplete  MRSA PCR Screening     Status: None   Collection Time: 06/01/14  9:51 AM  Result Value Ref Range Status   MRSA by PCR NEGATIVE NEGATIVE Final    Comment:        The GeneXpert MRSA Assay (FDA approved for NASAL specimens only), is one component of a comprehensive MRSA colonization surveillance program. It is not intended to diagnose MRSA infection nor to guide or monitor treatment for MRSA infections.      Studies:  Recent x-ray studies have been reviewed in detail by the Attending Physician  Scheduled Meds:  Scheduled Meds: . aspirin  81 mg Oral Daily  . atorvastatin  40 mg Oral q1800  . carvedilol  3.125 mg Oral BID WC  . cycloSPORINE  100 mg Oral BID  . feeding supplement (GLUCERNA SHAKE)  237 mL Oral TID BM  . fluconazole  150 mg Oral Daily  . insulin aspart  0-20 Units Subcutaneous TID WC  . insulin detemir  15 Units Subcutaneous BID  . isosorbide mononitrate  15 mg Oral Daily  . mycophenolate  180 mg Oral Daily  . prasugrel  10 mg Oral Daily  . predniSONE  5 mg Oral Q  breakfast  . sodium chloride  3 mL Intravenous Q12H    Time spent on care of this patient: 40 mins   Elliot Simoneaux, Roselind MessierURTIS J , MD  [facility] Office  [phone] Pager - [phone]  On-Call/Text Page:      [email or web address]      password [email or web address]  If 7PM-7AM, please contact night-coverage [email or web address] Password [email or web address] 06/07/2014, 8:04 AM   LOS: 9 days   Care during the described time interval was provided by me .  I have reviewed this patient's available data, including medical history, events of note, physical examination, radiology studies and test results as part of my evaluation  Carolyne Littlesurtis Arrianna Catala, MD [phone] Pager

## 2014-06-07 NOTE — Progress Notes (Signed)
PT Cancellation Note  Patient Details Name: Steven Riddle MRN: [id number] DOB: 12-19-1955   Cancelled Treatment:    Reason Eval/Treat Not Completed: Patient at procedure or test/unavailable;Medical issues which prohibited therapy.  Patient s/p cardiac stent placement, and will be on bedrest this afternoon.  Will hold PT today and return at a later time for PT session.   Vena AustriaDavis, Caitlynne Harbeck H 06/07/2014, 1:32 PM Durenda HurtSusan H. Renaldo Fiddleravis, PT, [facility] Acute Rehab Services Pager [phone]

## 2014-06-07 NOTE — Consult Note (Signed)
   [facility]   06/07/2014  Elliot Cousinmanullah Sculley  02-03-1955 [id number]  Referral received.  [facility].  Patient is not eligible for [facility] services because patient is not on the [facility] roster for [facility].  For any additional questions or new referrals please contact: Charlesetta ShanksVictoria Alida Greiner, RN BSN CCM [facility] Liaison  [phone] business mobile phone

## 2014-06-07 NOTE — Progress Notes (Signed)
PHARMACIST - PHYSICIAN COMMUNICATION  CONCERNING:  Lipitor and Cyclosporine   RECOMMENDATION: Change statin to one below  DESCRIPTION: The combination of cyclosporine and Lipitor 40 mg is contra-indicated -- > cyclosporine increases the concentration of atorvastatin leading to adverse effects.  Maximum statin doses recommended with cyclosporine are listed below: Crestor 5 mg po daily Fluvastatin 40 mg daily Pravastatin 20 mg daily  He was not on a statin prior to admission, so one of the selections above may get him to his goal LDL   Thank you. Okey RegalLisa Babs Dabbs, PharmD [phone]

## 2014-06-07 NOTE — Progress Notes (Signed)
Subjective:  Doing well denies any chest pain or shortness of breath. No further episodes of hypoglycemia. Tolerated PCI to LAD and diagonal 1 with excellent angiographic results. We will stage PCI to RCA in 3-4 weeks.  Objective:  Vital Signs in the last 24 hours: Temp:  [98 F (36.7 C)-99.3 F (37.4 C)] 98 F (36.7 C) (05/05 1400) Pulse Rate:  [72-85] 85 (05/05 1400) Resp:  [9-24] 16 (05/05 1400) BP: (124-161)/(50-80) 147/53 mmHg (05/05 1400) SpO2:  [0 %-100 %] 97 % (05/05 1400) Weight:  [63.5 kg (139 lb 15.9 oz)] 63.5 kg (139 lb 15.9 oz) (05/05 0500)  Intake/Output from previous day: 05/04 0701 - 05/05 0700 In: 2309 [P.O.:390; I.V.:1419; IV Piggyback:500] Out: 800 [Urine:800] Intake/Output from this shift: Total I/O In: 402.4 [P.O.:360; I.V.:42.4] Out: 600 [Urine:600]  Physical Exam: Neck: no adenopathy, no carotid bruit, no JVD and supple, symmetrical, trachea midline Lungs: clear to auscultation bilaterally Heart: regular rate and rhythm, S1, S2 normal and Soft systolic murmur noted Abdomen: soft, non-tender; bowel sounds normal; no masses,  no organomegaly Extremities: extremities normal, atraumatic, no cyanosis or edema  Lab Results:  Recent Labs  06/06/14 0235 06/07/14 0530  WBC 13.7* 13.2*  HGB 12.1* 11.1*  PLT 292 269    Recent Labs  06/06/14 0235 06/07/14 0530  NA 141 139  K 3.4* 3.7  CL 105 107  CO2 26 26  GLUCOSE 63* 67*  BUN 15 16  CREATININE 0.84 0.74   No results for input(s): TROPONINI in the last 72 hours.  Invalid input(s): CK, MB Hepatic Function Panel No results for input(s): PROT, ALBUMIN, AST, ALT, ALKPHOS, BILITOT, BILIDIR, IBILI in the last 72 hours. No results for input(s): CHOL in the last 72 hours. No results for input(s): PROTIME in the last 72 hours.  Imaging: Imaging results have been reviewed and No results found.  Cardiac Studies:  Assessment/Plan:  status post small non-Q-wave myocardial infarction status post left  cath/PTCA stenting to proximal left circumflex/obtuse marginal. With excellent angiographic results. Multivessel CAD status post PTCA stenting to proximal LAD and diagonal 1 today with excellent angiographic results Status post bilateral pneumonia Status post acute respiratory failure Coronary artery disease history of possible silent anteroseptal wall MI in the past compensated systolic heart failure improved Hypertension Insulin requiring diabetes mellitus Peripheral neuropathy Anemia of chronic disease Depression Anxiety disorder End-stage renal disease status post renal transplant on chronic immunosuppressive therapy  Plan And tinea present management Check labs in a.m. Possible discharge tomorrow if stable  LOS: 9 days    [name], Renaldo Gornick 06/07/2014, 5:32 PM

## 2014-06-07 NOTE — Progress Notes (Signed)
Inpatient Diabetes Program Recommendations  AACE/ADA: New Consensus Statement on Inpatient Glycemic Control (2013)  Target Ranges:  Prepandial:   less than 140 mg/dL      Peak postprandial:   less than 180 mg/dL (1-2 hours)      Critically ill patients:  140 - 180 mg/dL  Results for Beckie SaltsKHAN, AMAAH (MRN [id number]) as of 06/07/2014 12:10  Ref. Range 06/06/2014 08:32 06/06/2014 09:27 06/06/2014 12:36 06/06/2014 17:02 06/06/2014 21:26 06/07/2014 05:33 06/07/2014 10:08 06/07/2014 10:26  Glucose-Capillary Latest Ref Range: 70-99 mg/dL 57 (L) 621122 (H) 308278 (H) 189 (H) 107 (H) 73 25 (LL) 173 (H)   Reduce Levemir to 10 units BID and Novolog to Moderate scale instead of resistant. Thank you  Piedad ClimesGina Sundae Maners BSN, RN,CDE Inpatient Diabetes Coordinator [phone] (team pager)

## 2014-06-07 NOTE — Interval H&P Note (Signed)
Cath Lab Visit (complete for each Cath Lab visit)  Clinical Evaluation Leading to the Procedure:   ACS: Yes.    Non-ACS:    Anginal Classification: CCS IV  Anti-ischemic medical therapy: Maximal Therapy (2 or more classes of medications)  Non-Invasive Test Results: No non-invasive testing performed  Prior CABG: No previous CABG      History and Physical Interval Note:  06/07/2014 7:44 AM  Steven Riddle  has presented today for surgery, with the diagnosis of cad  The various methods of treatment have been discussed with the patient and family. After consideration of risks, benefits and other options for treatment, the patient has consented to  Procedure(s): Coronary Stent Intervention (N/A) as a surgical intervention .  The patient's history has been reviewed, patient examined, no change in status, stable for surgery.  I have reviewed the patient's chart and labs.  Questions were answered to the patient's satisfaction.     [name], Jerrye Seebeck

## 2014-06-07 NOTE — Progress Notes (Signed)
Benefits check in process for [employer]. CM will make pt aware once completed. 30 day free card to be given to pt before d/c. Pt will need Rx for 30 day free no refills. Rollator Walker to be delivered to room before d/c. No further needs from CM at this time. Gala LewandowskyGraves-Bigelow, Jayliah Benett Kaye, RN,BSN Case Manager.

## 2014-06-08 DIAGNOSIS — J9601 Acute respiratory failure with hypoxia: Secondary | ICD-10-CM | POA: Diagnosis present

## 2014-06-08 DIAGNOSIS — I214 Non-ST elevation (NSTEMI) myocardial infarction: Secondary | ICD-10-CM | POA: Diagnosis present

## 2014-06-08 DIAGNOSIS — I5022 Chronic systolic (congestive) heart failure: Secondary | ICD-10-CM | POA: Diagnosis present

## 2014-06-08 DIAGNOSIS — Z940 Kidney transplant status: Secondary | ICD-10-CM | POA: Diagnosis present

## 2014-06-08 LAB — CBC WITH DIFFERENTIAL/PLATELET
BASOS ABS: 0 10*3/uL (ref 0.0–0.1)
BASOS PCT: 0 % (ref 0–1)
Eosinophils Absolute: 0.3 10*3/uL (ref 0.0–0.7)
Eosinophils Relative: 3 % (ref 0–5)
HCT: 31.6 % — ABNORMAL LOW (ref 39.0–52.0)
Hemoglobin: 10.3 g/dL — ABNORMAL LOW (ref 13.0–17.0)
LYMPHS PCT: 9 % — AB (ref 12–46)
Lymphs Abs: 1 10*3/uL (ref 0.7–4.0)
MCH: 28.1 pg (ref 26.0–34.0)
MCHC: 32.6 g/dL (ref 30.0–36.0)
MCV: 86.3 fL (ref 78.0–100.0)
MONO ABS: 1.4 10*3/uL — AB (ref 0.1–1.0)
Monocytes Relative: 12 % (ref 3–12)
NEUTROS ABS: 8.5 10*3/uL — AB (ref 1.7–7.7)
Neutrophils Relative %: 76 % (ref 43–77)
PLATELETS: 236 10*3/uL (ref 150–400)
RBC: 3.66 MIL/uL — ABNORMAL LOW (ref 4.22–5.81)
RDW: 14.5 % (ref 11.5–15.5)
WBC: 11.2 10*3/uL — ABNORMAL HIGH (ref 4.0–10.5)

## 2014-06-08 LAB — GLUCOSE, CAPILLARY
GLUCOSE-CAPILLARY: 178 mg/dL — AB (ref 70–99)
GLUCOSE-CAPILLARY: 162 mg/dL — AB (ref 70–99)
Glucose-Capillary: 164 mg/dL — ABNORMAL HIGH (ref 70–99)

## 2014-06-08 LAB — COMPREHENSIVE METABOLIC PANEL
ALBUMIN: 1.9 g/dL — AB (ref 3.5–5.0)
ALT: 43 U/L (ref 17–63)
ANION GAP: 6 (ref 5–15)
AST: 33 U/L (ref 15–41)
Alkaline Phosphatase: 100 U/L (ref 38–126)
BILIRUBIN TOTAL: 0.8 mg/dL (ref 0.3–1.2)
BUN: 12 mg/dL (ref 6–20)
CO2: 23 mmol/L (ref 22–32)
CREATININE: 0.72 mg/dL (ref 0.61–1.24)
Calcium: 8 mg/dL — ABNORMAL LOW (ref 8.9–10.3)
Chloride: 108 mmol/L (ref 101–111)
GFR calc non Af Amer: >60 mL/min (ref >60)
Glucose, Bld: 226 mg/dL — ABNORMAL HIGH (ref 70–99)
Potassium: 3.9 mmol/L (ref 3.5–5.1)
Sodium: 137 mmol/L (ref 135–145)
TOTAL PROTEIN: 5.3 g/dL — AB (ref 6.5–8.1)

## 2014-06-08 LAB — MAGNESIUM: Magnesium: 1.3 mg/dL — ABNORMAL LOW (ref 1.7–2.4)

## 2014-06-08 LAB — POCT ACTIVATED CLOTTING TIME: ACTIVATED CLOTTING TIME: 405 s

## 2014-06-08 MED ORDER — ROSUVASTATIN CALCIUM 5 MG PO TABS: 5.0000 mg | ORAL_TABLET | Freq: Every day | ORAL | Status: DC

## 2014-06-08 MED ORDER — INSULIN ASPART 100 UNIT/ML FLEXPEN: 5.0000 [IU] | PEN_INJECTOR | Freq: Three times a day (TID) | SUBCUTANEOUS | Status: DC

## 2014-06-08 MED ORDER — INSULIN DETEMIR 100 UNIT/ML ~~LOC~~ SOLN: 10.0000 [IU] | Freq: Two times a day (BID) | SUBCUTANEOUS | Status: DC

## 2014-06-08 MED ORDER — PRASUGREL HCL 10 MG PO TABS: 10.0000 mg | ORAL_TABLET | Freq: Every day | ORAL | Status: DC

## 2014-06-08 MED ORDER — CARVEDILOL 3.125 MG PO TABS: 3.1250 mg | ORAL_TABLET | Freq: Two times a day (BID) | ORAL | Status: DC

## 2014-06-08 MED ORDER — ROSUVASTATIN CALCIUM 5 MG PO TABS
5.0000 mg | ORAL_TABLET | Freq: Every day | ORAL | Status: DC
  Filled 2014-06-08: qty 1

## 2014-06-08 MED ORDER — MAGNESIUM OXIDE 400 (241.3 MG) MG PO TABS
400.0000 mg | ORAL_TABLET | Freq: Once | ORAL | Status: AC
Start: 2014-06-08 — End: ?
  Filled 2014-06-08: qty 1

## 2014-06-08 MED FILL — Heparin Sodium (Porcine) 2 Unit/ML in Sodium Chloride 0.9%: INTRAMUSCULAR | Qty: 1000 | Status: AC

## 2014-06-08 MED FILL — Lidocaine HCl Local Preservative Free (PF) Inj 1%: INTRAMUSCULAR | Qty: 30 | Status: AC

## 2014-06-08 MED FILL — Heparin Sodium (Porcine) 2 Unit/ML in Sodium Chloride 0.9%: INTRAMUSCULAR | Qty: 500 | Status: AC

## 2014-06-08 NOTE — Progress Notes (Signed)
Physical Therapy Treatment Patient Details Name: Steven Riddle MRN: [id number] DOB: Sep 27, 1955 Today's Date: 06/08/2014    History of Present Illness 59 y.o. male with hx of renal transplant 2013 admitted for airspace disease w/ acute hypoxic respiratory failure - Bil PNA, acute on chronic systolic heart failure, and small non-Q-wave myocardial infarction s/p PTCA cath 5/2    PT Comments    Patient improving with mobility and gait.  Attempted ambulation without AD. Patient unsteady, staggering to both sides.  Will need rollator for use at home for balance and safety.  Follow Up Recommendations  Home health PT;Supervision/Assistance - 24 hour     Equipment Recommendations  Other (comment) (Rollator)    Recommendations for Other Services       Precautions / Restrictions Restrictions Weight Bearing Restrictions: No RUE Weight Bearing: Non weight bearing    Mobility  Bed Mobility                  Transfers Overall transfer level: Needs assistance Equipment used: None Transfers: Sit to/from Stand Sit to Stand: Min guard         General transfer comment: Assist for safety/balance.  Ambulation/Gait Ambulation/Gait assistance: Min guard;Min assist Ambulation Distance (Feet): 250 Feet Assistive device: None Gait Pattern/deviations: Step-through pattern;Decreased stride length;Staggering left;Staggering right Gait velocity: decreased Gait velocity interpretation: Below normal speed for age/gender General Gait Details: Attempted ambulation with no assistive device.  Patient with staggering gait to left and right.  Able to self-correct most of the time, but on 2 occasions required assist to maintain balance.  Patient will need rollator for home for balance/safety.   Stairs            Wheelchair Mobility    Modified Rankin (Stroke Patients Only)       Balance                                    Cognition Arousal/Alertness:  Awake/alert Behavior During Therapy: WFL for tasks assessed/performed Overall Cognitive Status: Within Functional Limits for tasks assessed                      Exercises      General Comments        Pertinent Vitals/Pain Pain Assessment: No/denies pain    Home Living                      Prior Function            PT Goals (current goals can now be found in the care plan section) Progress towards PT goals: Progressing toward goals    Frequency  Min 3X/week    PT Plan Current plan remains appropriate;Equipment recommendations need to be updated    Co-evaluation             End of Session   Activity Tolerance: Patient tolerated treatment well Patient left: in chair;with call bell/phone within reach;with family/visitor present     Time: [id number] PT Time Calculation (min) (ACUTE ONLY): 14 min  Charges:  $Gait Training: 8-22 mins                    G Codes:      Vena AustriaDavis, Alika Saladin H 06/08/2014, 10:47 AM Durenda HurtSusan H. Renaldo Fiddleravis, PT, [facility] Acute Rehab Services Pager [phone]

## 2014-06-08 NOTE — Progress Notes (Signed)
[facility], who states that patient's rollator is in the process of being delivered.  Patient was updated.

## 2014-06-08 NOTE — Progress Notes (Signed)
Subjective:  Doing well denies any chest pain or shortness of breath.up in chair earlier today.  Objective:  Vital Signs in the last 24 hours: Temp:  [97.8 F (36.6 C)-98.4 F (36.9 C)] 98.3 F (36.8 C) (05/06 0748) Pulse Rate:  [78-90] 80 (05/06 0748) Resp:  [14-21] 16 (05/06 0748) BP: (133-155)/(46-79) 135/55 mmHg (05/06 0748) SpO2:  [94 %-99 %] 98 % (05/06 0748) Weight:  [70.8 kg (156 lb 1.4 oz)] 70.8 kg (156 lb 1.4 oz) (05/06 0006)  Intake/Output from previous day: 05/05 0701 - 05/06 0700 In: 908.3 [P.O.:840; I.V.:68.3] Out: 2600 [Urine:2600] Intake/Output from this shift:    Physical Exam: Neck: no adenopathy, no carotid bruit, no JVD and supple, symmetrical, trachea midline Lungs: clear to auscultation bilaterally Heart: regular rate and rhythm, S1, S2 normal and soft systolic murmur noted Abdomen: soft, non-tender; bowel sounds normal; no masses,  no organomegaly Extremities: extremities normal, atraumatic, no cyanosis or edema and right groin stable minimal ecchymosis  Lab Results:  Recent Labs  06/07/14 0530 06/08/14 0455  WBC 13.2* 11.2*  HGB 11.1* 10.3*  PLT 269 236    Recent Labs  06/07/14 0530 06/08/14 0455  NA 139 137  K 3.7 3.9  CL 107 108  CO2 26 23  GLUCOSE 67* 226*  BUN 16 12  CREATININE 0.74 0.72   No results for input(s): TROPONINI in the last 72 hours.  Invalid input(s): CK, MB Hepatic Function Panel  Recent Labs  06/08/14 0455  PROT 5.3*  ALBUMIN 1.9*  AST 33  ALT 43  ALKPHOS 100  BILITOT 0.8   No results for input(s): CHOL in the last 72 hours. No results for input(s): PROTIME in the last 72 hours.  Imaging: Imaging results have been reviewed and No results found.  Cardiac Studies:  Assessment/Plan:  status post small non-Q-wave myocardial infarction status post left cath/PTCA stenting to proximal left circumflex/obtuse marginal. With excellent angiographic results. Multivessel CAD status post PTCA stenting to  proximal LAD and diagonal 1 today with excellent angiographic results Status post bilateral pneumonia Status post acute respiratory failure Coronary artery disease history of possible silent anteroseptal wall MI in the past compensated systolic heart failure improved Hypertension Insulin requiring diabetes mellitus Peripheral neuropathy Anemia of chronic disease/hydration and blood loss during multiple PCI's Depression Anxiety disorder End-stage renal disease status post renal transplant on chronic immunosuppressive therapy  Plan Continue present management Discussed with patient at length regarding lifestyle changes diet noncompliance with medication and follow-up Is scheduled for PCI to RCA as outpatient in 2 weeks Follow-up with me in one week Post cardiac catheter/PCI instructions have been given. We will schedule for phase 2 cardiac rehabilitation as an outpatient after PCI to RCA. Okay to discharge from cardiac point of view.  LOS: 10 days    Steven Riddle, Steven Riddle 06/08/2014, 9:42 AM

## 2014-06-08 NOTE — Discharge Summary (Signed)
Physician Discharge Summary  Elliot Cousinmanullah Pitzer AVW:[id number] DOB: 06-24-1955 DOA: 05/29/2014  PCP: Romero BellingELLISON, SEAN, MD  Admit date: 05/29/2014 Discharge date: 06/08/2014  Time spent: 40 minutes  Recommendations for Outpatient Follow-up:   Bilateral airspace disease w/ acute hypoxic respiratory failure/CAP -Now on room air  - Completed 7 day course of cont empiric abx tx to complete 7 days of therapy  Chronic systolic heart failure -New Baseline wgt appears 70.8 kg, patient to weigh himself as soon as he arrives home today, and record in his journal. Daily a.m. weight..   - Strict in and out; since admission - 3.8 L  -Follow-up with Dr.Harwani, Mohan (cardiology) in one week -scheduled for PCI to RCA as outpatient in 2 weeks  Renal transplant 2013 / Chronic immunosuppression -Cr within normal limit  - followed by [facility] -Continue cyclosporine 100 mg BID -Continue Diflucan 150 mg daily -Continue mycophenolate 180 mg daily -Prednisone 5 mg daily  Hypokalemia  -Potassium goal > 4   Hypomagnesemia -Magnesium goal> 2 -Magnesium oxide 400 mg  HTN  -BP currently reasonably controlled -Coreg 3.25 mg BID -Follow-up with Dr.Harwani, Mohan (cardiology) in one week   RBBB  Elevated troponin - 1.03>1.01>0.96 - Possible small non-Q-wave myocardial infarction - CAD  -history of possible silent anteroseptal wall MI in the past  - S/P cardiac cath; cath/PTCA stenting to proximal left circumflex/obtuse marginal  DM Type 1 uncontrolled -4/26 HgA1c= 12.1  -Continue Levemir 10 units BID -NovoLog 5 units with meals      Discharge Diagnoses:  Principal Problem:   CHF (congestive heart failure) Active Problems:   DM type 2 causing renal disease   CAP (community acquired pneumonia)   Hypoxia   NSTEMI (non-ST elevated myocardial infarction)   Acute respiratory failure with hypoxemia   Acute pulmonary edema   Protein-calorie malnutrition, severe   Acute on chronic  respiratory failure with hypoxia   Renal transplant recipient   Immunosuppressed status   Hypokalemia   Essential hypertension   Elevated troponin   Diabetes type 1, uncontrolled   Chronic systolic congestive heart failure   History of renal transplant   Acute non Q wave MI (myocardial infarction), initial episode of care   Acute respiratory failure with hypoxia   Chronic systolic heart failure   Renal transplant, status post   Hypomagnesemia   Non Q wave myocardial infarction   Discharge Condition: Stable  Diet recommendation: Heart healthy/carb modified  Filed Weights   06/06/14 0400 06/07/14 0500 06/08/14 0006  Weight: 63.6 kg (140 lb 3.4 oz) 63.5 kg (139 lb 15.9 oz) 70.8 kg (156 lb 1.4 oz)    History of present illness:  59 yo [place] male PMHx anxiety, depression, anemia, HTN, multivessel CAD, systolic CHF, DM Type 1, Hyperlipidemia, s/p renal tranpslant in [place]  Presented on 4/26 from his Endocrinologist's office with O2 sats 78% on room air. Noted one week of cough, white sputum, lower ext edema, with out fevers or chest pain. Noted to have + tropin's without acute EKG changes but he has Rt BBB. No prior cardiac history. CXR on admit 4/26 with B air space disease.   Following admission he remained on BIPAP >24 hours and was unable to come off. He was therefore moved to the ICU under the care of PCCM Previous hospitalization patient was treated for CAP, non-Q-wave MI with subsequent cardiac cath and stent placement. Patient currently back to baseline with negative CP SOB   Consultants: Cardiology - Dr. Sharyn LullHarwani  Procedure/Significant Events:  4/27 emergently intubated  4/27 CVL L IJ 4/29 extubated  5/5 cardiac catheterization;cath/PTCA stenting to proximal left circumflex/obtuse marginal  Culture   Antibiotics: 4/26 cefepime > stopped 5/3 4/28 diflucan > 4/26 vanc > stopped 5/3 4/28 Dapsone> 4/29   Discharge Exam: Filed Vitals:   06/08/14  0400 06/08/14 0446 06/08/14 0748 06/08/14 1305  BP: 151/64  135/55 141/64  Pulse: 78  80 76  Temp:  98.1 F (36.7 C) 98.3 F (36.8 C) 98.1 F (36.7 C)  TempSrc:  Oral Oral Oral  Resp:  16 16 16   Height:      Weight:      SpO2: 94%  98% 99%    General: A/O 4, NAD, No acute respiratory distress Lungs: Clear to auscultation bilaterally without wheezes or crackles Cardiovascular: Regular rate and rhythm without murmur gallop or rub normal S1 and S2 Abdomen: Nontender, nondistended, soft, bowel sounds positive, no rebound, no ascites, no appreciable mass Extremities: No significant cyanosis, clubbing, or edema bilateral lower extremities  Discharge Instructions      Discharge Instructions    Amb Referral to Cardiac Rehabilitation    Complete by:  As directed   Congestive Heart Failure: If diagnosis is Heart Failure, patient MUST meet each of the CMS criteria: 1. Left Ventricular Ejection Fraction </= 35% 2. NYHA class II-IV symptoms despite being on optimal heart failure therapy for at least 6 weeks. 3. Stable = have not had a recent (<6 weeks) or planned (<6 months) major cardiovascular hospitalization or procedure  Program Details: - Physician supervised classes - 1-3 classes per week over a 12-18 week period, generally for a total of 36 sessions  Physician Certification: I certify that the above Cardiac Rehabilitation treatment is medically necessary and is medically approved by me for treatment of this patient. The patient is willing and cooperative, able to ambulate and medically stable to participate in exercise rehabilitation. The participant's progress and Individualized Treatment Plan will be reviewed by the Medical Director, Cardiac Rehab staff and as indicated by the Referring/Ordering Physician.  Diagnosis:   PCI Myocardial Infarction              Medication List    TAKE these medications        aspirin 81 MG tablet  Take 81 mg by mouth daily.     BD  ULTRA-FINE PEN NEEDLES 29G X 12.7MM Misc  Generic drug:  Insulin Pen Needle  USE AS DIRECTED     carvedilol 3.125 MG tablet  Commonly known as:  COREG  Take 1 tablet (3.125 mg total) by mouth 2 (two) times daily with a meal.     cycloSPORINE 100 MG capsule  Commonly known as:  SANDIMMUNE  Take 100 mg by mouth 2 (two) times daily.     fluconazole 150 MG tablet  Commonly known as:  DIFLUCAN  Take 150 mg by mouth daily.     insulin aspart 100 UNIT/ML FlexPen  Commonly known as:  NOVOLOG FLEXPEN  Inject 5 Units into the skin 3 (three) times daily with meals.     insulin detemir 100 UNIT/ML injection  Commonly known as:  LEVEMIR  Inject 0.1 mLs (10 Units total) into the skin 2 (two) times daily. 15 units every morning and use 20 units every evening     mycophenolate 180 MG EC tablet  Commonly known as:  MYFORTIC  Take 180 mg by mouth daily.     prasugrel 10 MG Tabs tablet  Commonly known as:  EFFIENT  Take 1 tablet (10 mg total) by mouth daily.     predniSONE 5 MG tablet  Commonly known as:  DELTASONE  Take 5 mg by mouth daily.     rosuvastatin 5 MG tablet  Commonly known as:  CRESTOR  Take 1 tablet (5 mg total) by mouth daily at 6 PM.       No Known Allergies Follow-up Information    Follow up with [facility].   Why:  hhrn and hhpt, will call to set up appt 24-48 hrs after disch   Contact information:   [address] [place] [address] [phone]       Follow up with [name], MD. Schedule an appointment as soon as possible for a visit in 1 week.   Specialty:  Cardiology   Why:  Follow-up chronic systolic heart failure, cardiac catheterization, uncontrolled diabetes   Contact information:   [address] [place] [address] [phone]       Follow up with Romero Belling, MD. Schedule an appointment as soon as possible for a visit in 3 weeks.   Specialty:  Endocrinology   Why:  Hospital follow-up CAP,  chronic systolic heart failure, uncontrolled diabetes type 2   Contact information:   [address] [place] [address] [phone]        The results of significant diagnostics from this hospitalization (including imaging, microbiology, ancillary and laboratory) are listed below for reference.    Significant Diagnostic Studies: Dg Chest 2 View  06/03/2014   CLINICAL DATA:  Congestive heart failure.  EXAM: CHEST  2 VIEW  COMPARISON:  06/01/2014  FINDINGS: Left IJ central line tip overlies the level of the superior vena cava. Endotracheal tube and nasogastric tube have been removed.  There is elevation of the right hemidiaphragm. The heart is enlarged. There patchy lung densities bilaterally, showing minimal improvement since most recent exam and showing continued improvement over the last several days.  IMPRESSION: 1. Removal of endotracheal tube and nasogastric tube. 2. Improving aeration.   Electronically Signed   By: Norva Pavlov M.D.   On: 06/03/2014 09:25   Dg Chest Port 1 View  06/01/2014   CLINICAL DATA:  Intubation.  EXAM: PORTABLE CHEST - 1 VIEW  COMPARISON:  05/31/2014.  FINDINGS: Endotracheal tube, left IJ line, NG tube in good anatomic position. Heart size stable. Persistent but improving bilateral pulmonary infiltrates. No pleural effusion or pneumothorax. No acute osseous abnormality.  IMPRESSION: 1. Lines and tubes in stable position. 2. Persistent but improving bilateral pulmonary infiltrates.   Electronically Signed   By: Maisie Fus  Register   On: 06/01/2014 07:16   Dg Chest Port 1 View  05/31/2014   CLINICAL DATA:  Intubated patient.  Hypoxia.  EXAM: PORTABLE CHEST - 1 VIEW  COMPARISON:  Single view of the chest 05/30/2014 and 05/29/2014.  FINDINGS: Endotracheal tube, endotracheal tube and left IJ catheter remain in place. A new NG tube is in place with the tip in the stomach. Right worse than left airspace disease persists but appears mildly improved. Heart  size is normal. No pneumothorax.  IMPRESSION: NG tube tip is in the stomach.  Extensive right worse than left airspace disease show some improvement since yesterday's exam.   Electronically Signed   By: Drusilla Kanner M.D.   On: 05/31/2014 07:21   Dg Chest Port 1 View  05/30/2014   CLINICAL DATA:  Hypoxia  EXAM: PORTABLE CHEST - 1 VIEW  COMPARISON:  May 29, 2014  FINDINGS: Endotracheal tube tip is 3.5 cm above the carina. Central catheter tip is at the cavoatrial junction. No pneumothorax. There has been progression of airspace consolidation bilaterally with extensive airspace consolidation throughout both mid and lower lung zones as well as in the right upper lobe. Heart is mildly enlarged with pulmonary vascularity within normal limits. No adenopathy.  IMPRESSION: Tube and catheter positions as described without pneumothorax. Progression of airspace consolidation bilaterally, more on the right than on the left. No change in cardiac silhouette.   Electronically Signed   By: Bretta Bang III M.D.   On: 05/30/2014 14:02   Dg Chest Portable 1 View  05/29/2014   CLINICAL DATA:  Shortness of breath for a few days. Hypertension and diabetes  EXAM: PORTABLE CHEST - 1 VIEW  COMPARISON:  01/09/1999  FINDINGS: Dense patchy bilateral airspace disease, asymmetric to the right. There is mild cardiomegaly without interstitial edema or pleural effusion. Negative aortic and hilar contours.  IMPRESSION: Bilateral airspace disease consistent with pneumonia.   Electronically Signed   By: Marnee Spring M.D.   On: 05/29/2014 14:41   Dg Abd Portable 1v  05/30/2014   CLINICAL DATA:  59 year old male with recent orogastric tube placement  EXAM: PORTABLE ABDOMEN - 1 VIEW  COMPARISON:  Chest x-ray obtained earlier tonight  FINDINGS: The tip of an orogastric tube projects over the stomach. Incompletely imaged central venous catheter with the tip overlying the mid right atrium. The bowel gas pattern is not obstructed.  Diffuse bilateral interstitial and airspace opacities in the visualized lower lungs.  IMPRESSION: The tip of the orogastric tube projects over the stomach.   Electronically Signed   By: Malachy Moan M.D.   On: 05/30/2014 23:35    Microbiology: Recent Results (from the past 240 hour(s))  Blood culture (routine x 2)     Status: None   Collection Time: 05/29/14  3:37 PM  Result Value Ref Range Status   Specimen Description BLOOD LEFT HAND  Final   Special Requests BOTTLES DRAWN AEROBIC AND ANAEROBIC 5CC  Final   Culture   Final    NO GROWTH 5 DAYS Performed at [facility]    Report Status 06/04/2014 FINAL  Final  AFB culture with smear     Status: None (Preliminary result)   Collection Time: 05/30/14  1:36 PM  Result Value Ref Range Status   Specimen Description BRONCHIAL ALVEOLAR LAVAGE  Final   Special Requests LEFT LOWER LOBE  Final   Acid Fast Smear   Final    NO ACID FAST BACILLI SEEN Performed at [facility]    Culture   Final    CULTURE WILL BE EXAMINED FOR 6 WEEKS BEFORE ISSUING A FINAL REPORT Performed at [facility]    Report Status PENDING  Incomplete  Fungus Culture with Smear     Status: None (Preliminary result)   Collection Time: 05/30/14  1:36 PM  Result Value Ref Range Status   Specimen Description BRONCHIAL ALVEOLAR LAVAGE  Final   Special Requests LEFT LOWER LOBE  Final   Fungal Smear   Final    NO YEAST OR FUNGAL ELEMENTS SEEN Performed at [facility]    Culture   Final    CULTURE IN PROGRESS FOR FOUR WEEKS Performed at [facility]    Report Status PENDING  Incomplete  Culture, bal-quantitative     Status: None   Collection Time: 05/30/14  1:36 PM  Result Value Ref Range Status   Specimen Description BRONCHIAL ALVEOLAR LAVAGE  Final   Special Requests LEFT LOWER LOBE  Final   Gram Stain   Final    FEW WBC PRESENT, PREDOMINANTLY PMN NO SQUAMOUS EPITHELIAL CELLS SEEN NO ORGANISMS SEEN Performed at  [facility]    Colony Count NO GROWTH Performed at [facility]   Final   Culture   Final    NO GROWTH 2 DAYS Performed at [facility]    Report Status 06/01/2014 FINAL  Final  AFB culture with smear     Status: None (Preliminary result)   Collection Time: 05/30/14  1:39 PM  Result Value Ref Range Status   Specimen Description BRONCHIAL ALVEOLAR LAVAGE  Final   Special Requests RIGHT LOWER LOBE  Final   Acid Fast Smear   Final    NO ACID FAST BACILLI SEEN Performed at [facility]    Culture   Final    CULTURE WILL BE EXAMINED FOR 6 WEEKS BEFORE ISSUING A FINAL REPORT Performed at [facility]    Report Status PENDING  Incomplete  Culture, bal-quantitative     Status: None   Collection Time: 05/30/14  1:39 PM  Result Value Ref Range Status   Specimen Description BRONCHIAL ALVEOLAR LAVAGE  Final   Special Requests RIGHT LOWER LOBE  Final   Gram Stain   Final    MODERATE WBC PRESENT, PREDOMINANTLY PMN NO SQUAMOUS EPITHELIAL CELLS SEEN NO ORGANISMS SEEN Performed at [facility] Count NO GROWTH Performed at [facility]   Final   Culture   Final    NO GROWTH 2 DAYS Performed at [facility]    Report Status 06/01/2014 FINAL  Final  Fungus Culture with Smear     Status: None (Preliminary result)   Collection Time: 05/30/14  1:39 PM  Result Value Ref Range Status   Specimen Description BRONCHIAL ALVEOLAR LAVAGE  Final   Special Requests RIGHT LOWER LOBE  Final   Fungal Smear   Final    NO YEAST OR FUNGAL ELEMENTS SEEN Performed at [facility]    Culture   Final    CULTURE IN PROGRESS FOR FOUR WEEKS Performed at [facility]    Report Status PENDING  Incomplete  MRSA PCR Screening     Status: None   Collection Time: 06/01/14  9:51 AM  Result Value Ref Range Status   MRSA by PCR NEGATIVE NEGATIVE Final    Comment:        The GeneXpert MRSA Assay (FDA approved for NASAL  specimens only), is one component of a comprehensive MRSA colonization surveillance program. It is not intended to diagnose MRSA infection nor to guide or monitor treatment for MRSA infections.      Labs: Basic Metabolic Panel:  Recent Labs Lab 06/02/14 0414 06/03/14 0506 06/04/14 0035 06/05/14 0305 06/06/14 0235 06/07/14 0530 06/08/14 0455  NA 142 141 137 137 141 139 137  K 3.1* 2.8* 4.0 3.6 3.4* 3.7 3.9  CL 101 102 103 102 105 107 108  CO2 GLUCOSE 181* 131* 187* 335* 63* 67* 226*  BUN 59* 50* 36* 22* CREATININE 1.75* 1.33* 1.10 0.86 0.84 0.74 0.72  CALCIUM 8.9 8.4* 8.1* 8.0* 8.7* 8.3* 8.0*  MG 1.9 2.0 1.7  --  1.6* 1.5* 1.3*  PHOS 4.8*  --   --   --   --   --   --  Liver Function Tests:  Recent Labs Lab 06/03/14 0506 06/08/14 0455  AST 36 33  ALT 36 43  ALKPHOS 100 100  BILITOT 1.2 0.8  PROT 6.0* 5.3*  ALBUMIN 2.0* 1.9*   No results for input(s): LIPASE, AMYLASE in the last 168 hours. No results for input(s): AMMONIA in the last 168 hours. CBC:  Recent Labs Lab 06/04/14 0035 06/05/14 0305 06/06/14 0235 06/07/14 0530 06/08/14 0455  WBC 12.8* 10.4 13.7* 13.2* 11.2*  NEUTROABS  --   --   --   --  8.5*  HGB 11.1* 11.0* 12.1* 11.1* 10.3*  HCT 34.8* 34.1* 37.5* 34.2* 31.6*  MCV 87.4 86.5 86.4 85.7 86.3  PLT 289 284 292 269 236   Cardiac Enzymes:  Recent Labs Lab 06/03/14 0506  TROPONINI 0.97*   BNP: BNP (last 3 results)  Recent Labs  05/30/14 0412 06/01/14 0400 06/03/14 0504  BNP 1665.6* 872.3* 753.1*    ProBNP (last 3 results) No results for input(s): PROBNP in the last 8760 hours.  CBG:  Recent Labs Lab 06/07/14 1334 06/07/14 1748 06/07/14 2123 06/08/14 0633 06/08/14 1302  GLUCAP 105* 167* 232* 178* 164*       Signed:  Carolyne Littlesurtis Lelynd Poer, MD [facility] [phone] pager

## 2014-06-08 NOTE — Progress Notes (Signed)
CARDIAC REHAB PHASE I   PRE:  Rate/Rhythm: 79 SR    BP: sitting 134/54, 108/40    SaO2:   MODE:  Ambulation: 300 ft   POST:  Rate/Rhythm: 85 SR    BP: sitting 131/54     SaO2:   Pt still with orthostasis with standing today but improved. Blinking eyes but denied dizziness. See above. BP improved with distance, to recliner. Ed completed, wife came in 1/2 way through ed. Pt and wife voiced understanding but not very receptive to checking CBG more often/controlling DM. Pt is thinking about [facility] and requests his name be sent to [place]. Did not discuss NTG as patient and family seemed tired of education and pt never had angina.  [phone]  Steven Riddle, Steven Riddle [name] CES, ACSM 06/08/2014 9:34 AM

## 2014-06-12 MED FILL — Lidocaine HCl Local Preservative Free (PF) Inj 1%: INTRAMUSCULAR | Qty: 30 | Status: AC

## 2014-06-12 MED FILL — Heparin Sodium (Porcine) 2 Unit/ML in Sodium Chloride 0.9%: INTRAMUSCULAR | Qty: 1000 | Status: AC

## 2014-06-18 NOTE — Patient Outreach (Signed)
[facility] Care Management  06/18/2014  Steven Riddle May 12, 1955 [id number]   Received referral from [facility],  Assigned [name], RN.  Steven Riddle, AABA [facility] CM Assistant Phone: [phone] Fax: [phone]

## 2014-06-26 ENCOUNTER — Other Ambulatory Visit: Payer: Self-pay

## 2014-06-26 LAB — FUNGUS CULTURE W SMEAR: FUNGAL SMEAR: NONE SEEN

## 2014-06-26 NOTE — Progress Notes (Signed)
This encounter was created in error - please disregard.

## 2014-06-26 NOTE — Patient Outreach (Signed)
[facility] Care Management  06/26/2014  Steven Riddle December 22, 1955 [id number]   SUBJECTIVE:  Telephone call to patient regarding [facility] with [facility].  HIPAA verified with patient.  Discussed and offered [facility]. Patient refuses services.  Patient states he had a home health nurse that is finished with him at this time but he continues to have physical therapy.  Patient states all he needs right now is the therapy and does not feel as though he needs any other services at this time.   PLAN: RNCM will refer patient to Nena PolioLisa Moore to close due to refusal of services.   RNCM will notify patients primary MD and [name] with [facility] of patients refusal of services   [name] [facility] ChildrenRN,BSN,CCM [facility] Telephonic Care Coordinator [phone]

## 2014-06-27 LAB — FUNGUS CULTURE W SMEAR: Fungal Smear: NONE SEEN

## 2014-06-28 NOTE — Patient Outreach (Signed)
[facility]) Care Management  06/28/2014  Steven Riddle Carelock 1955-08-02 [id number]   Notification from Steven Inaavina Green, RN to close case as patient refused to participate with [facility].  Steven MckusickLisa O. [name], AABA [facility] CM Assistant Phone: [phone] Fax: [phone]

## 2014-07-12 LAB — AFB CULTURE WITH SMEAR (NOT AT ARMC)
Acid Fast Smear: NONE SEEN
Acid Fast Smear: NONE SEEN

## 2014-08-14 ENCOUNTER — Ambulatory Visit (INDEPENDENT_AMBULATORY_CARE_PROVIDER_SITE_OTHER): Payer: PPO

## 2014-08-14 ENCOUNTER — Ambulatory Visit (INDEPENDENT_AMBULATORY_CARE_PROVIDER_SITE_OTHER): Payer: PPO | Admitting: Emergency Medicine

## 2014-08-14 VITALS — BP 151/75 | HR 83 | Temp 99.5°F | Resp 18 | Ht 67.0 in | Wt 140.0 lb

## 2014-08-14 DIAGNOSIS — M25551 Pain in right hip: Secondary | ICD-10-CM

## 2014-08-14 DIAGNOSIS — S7001XA Contusion of right hip, initial encounter: Secondary | ICD-10-CM

## 2014-08-14 MED ORDER — NAPROXEN SODIUM 550 MG PO TABS: 550.0000 mg | ORAL_TABLET | Freq: Two times a day (BID) | ORAL | Status: DC

## 2014-08-14 NOTE — Patient Instructions (Signed)
Hip Pain Your hip is the joint between your upper legs and your lower pelvis. The bones, cartilage, tendons, and muscles of your hip joint perform a lot of work each day supporting your body weight and allowing you to move around. Hip pain can range from a minor ache to severe pain in one or both of your hips. Pain may be felt on the inside of the hip joint near the groin, or the outside near the buttocks and upper thigh. You may have swelling or stiffness as well.  HOME CARE INSTRUCTIONS   Take medicines only as directed by your health care provider.  Apply ice to the injured area:  Put ice in a plastic bag.  Place a towel between your skin and the bag.  Leave the ice on for 15-20 minutes at a time, 3-4 times a day.  Keep your leg raised (elevated) when possible to lessen swelling.  Avoid activities that cause pain.  Follow specific exercises as directed by your health care provider.  Sleep with a pillow between your legs on your most comfortable side.  Record how often you have hip pain, the location of the pain, and what it feels like. SEEK MEDICAL CARE IF:   You are unable to put weight on your leg.  Your hip is red or swollen or very tender to touch.  Your pain or swelling continues or worsens after 1 week.  You have increasing difficulty walking.  You have a fever. SEEK IMMEDIATE MEDICAL CARE IF:   You have fallen.  You have a sudden increase in pain and swelling in your hip. MAKE SURE YOU:   Understand these instructions.  Will watch your condition.  Will get help right away if you are not doing well or get worse. Document Released: 07/09/2009 Document Revised: 06/05/2013 Document Reviewed: 09/15/2012 ExitCare Patient Information 2015 [employer]. This information is not intended to replace advice given to you by your health care provider. Make sure you discuss any questions you have with your health care provider.  

## 2014-08-14 NOTE — Progress Notes (Signed)
Subjective:  Patient ID: Steven Riddle, male    DOB: 04/05/1955  Age: 59 y.o. MRN: [id number]  CC: Fall   HPI [name] presents  on Saturday the patient missed a step and fell landing on his right hip. He has pain in his right hip that prevents him from walking without assistance. He has no numbness tingling or radiation of pain in that leg. No weakness. He has no history of fracture. Has no claudication. He's had no improvement of pain in his work was brought in by wheelchair. Been unable to control his pain with over-the-counter medication.  History Steven Riddle has a past medical history of DIABETES MELLITUS, TYPE I (01/10/2007); HYPERLIPIDEMIA (01/10/2007); HYPERKALEMIA (04/09/2009); ANEMIA (02/15/2007); ANXIETY (05/23/2007); DEPRESSION (01/10/2007); PERIPHERAL NEUROPATHY (01/10/2007); HYPERTENSION (01/10/2007); RENAL INSUFFICIENCY, CHRONIC (06/07/2008); SECONDARY HYPERPARATHYROIDISM (07/10/2008); Palpitations (03/19/2008); Proteinuria (06/07/2008); and Myocardial infarction.   He has past surgical history that includes Nephrectomy transplanted organ; Cardiac catheterization (N/A, 06/04/2014); Cardiac catheterization (N/A, 06/04/2014); Cardiac catheterization (N/A, 06/07/2014); and Eye surgery.   His  family history includes Diabetes in his brother, father, mother, and sister; Hypertension in his brother, mother, and sister; Stroke in his mother. There is no history of Cancer.  He   reports that he has never smoked. He does not have any smokeless tobacco history on file. He reports that he does not drink alcohol or use illicit drugs.  Outpatient Prescriptions Prior to Visit  Medication Sig Dispense Refill  . aspirin 81 MG tablet Take 81 mg by mouth daily.      . BD ULTRA-FINE PEN NEEDLES 29G X 12.7MM MISC USE AS DIRECTED 100 each 5  . carvedilol (COREG) 3.125 MG tablet Take 1 tablet (3.125 mg total) by mouth 2 (two) times daily with a meal. 60 tablet 0  . cycloSPORINE (SANDIMMUNE) 100 MG capsule Take  100 mg by mouth 2 (two) times daily.     . fluconazole (DIFLUCAN) 150 MG tablet Take 150 mg by mouth daily.    . insulin detemir (LEVEMIR) 100 UNIT/ML injection Inject 0.1 mLs (10 Units total) into the skin 2 (two) times daily. 15 units every morning and use 20 units every evening 10 mL 11  . mycophenolate (MYFORTIC) 180 MG EC tablet Take 180 mg by mouth daily.     . prasugrel (EFFIENT) 10 MG TABS tablet Take 1 tablet (10 mg total) by mouth daily. 30 tablet 0  . predniSONE (DELTASONE) 5 MG tablet Take 5 mg by mouth daily.    . rosuvastatin (CRESTOR) 5 MG tablet Take 1 tablet (5 mg total) by mouth daily at 6 PM. 30 tablet 0  . insulin aspart (NOVOLOG FLEXPEN) 100 UNIT/ML FlexPen Inject 5 Units into the skin 3 (three) times daily with meals. 15 mL 11   Facility-Administered Medications Prior to Visit  Medication Dose Route Frequency Provider Last Rate Last Dose  . magnesium oxide (MAG-OX) tablet 400 mg  400 mg Oral Once Drema Dallas, MD   400 mg at 06/08/14 1800    History   Social History  . Marital Status: Married    Spouse Name: N/A  . Number of Children: N/A  . Years of Education: N/A   Occupational History  . Owns [employer]    Social History Main Topics  . Smoking status: Never Smoker   . Smokeless tobacco: Not on file  . Alcohol Use: No  . Drug Use: No  . Sexual Activity: Not on file   Other Topics Concern  . None  Social History Narrative   Owns Hotels     Review of Systems  Constitutional: Negative for fever, chills and appetite change.  HENT: Negative for congestion, ear pain, postnasal drip, sinus pressure and sore throat.   Eyes: Negative for pain and redness.  Respiratory: Negative for cough, shortness of breath and wheezing.   Cardiovascular: Negative for leg swelling.  Gastrointestinal: Negative for nausea, vomiting, abdominal pain, diarrhea, constipation and blood in stool.  Endocrine: Negative for polyuria.  Genitourinary: Negative for dysuria, urgency,  frequency and flank pain.  Musculoskeletal: Negative for gait problem.  Skin: Negative for rash.  Neurological: Negative for weakness and headaches.  Psychiatric/Behavioral: Negative for confusion and decreased concentration. The patient is not nervous/anxious.     Objective:  BP 151/75 mmHg  Pulse 83  Temp(Src) 99.5 F (37.5 C) (Oral)  Resp 18  Ht 5\' 7"  (1.702 m)  Wt 140 lb (63.504 kg)  BMI 21.92 kg/m2  SpO2 97%  Physical Exam  Constitutional: He is oriented to person, place, and time. He appears well-developed and well-nourished. No distress.  HENT:  Head: Normocephalic and atraumatic.  Right Ear: External ear normal.  Left Ear: External ear normal.  Nose: Nose normal.  Eyes: Conjunctivae and EOM are normal. Pupils are equal, round, and reactive to light. No scleral icterus.  Neck: Normal range of motion. Neck supple. No tracheal deviation present.  Cardiovascular: Normal rate, regular rhythm and normal heart sounds.   Pulmonary/Chest: Effort normal. No respiratory distress. He has no wheezes. He has no rales.  Abdominal: He exhibits no mass. There is no tenderness. There is no rebound and no guarding.  Musculoskeletal: He exhibits no edema.       Right hip: He exhibits decreased range of motion and tenderness. He exhibits no deformity.  Lymphadenopathy:    He has no cervical adenopathy.  Neurological: He is alert and oriented to person, place, and time. Coordination normal.  Skin: Skin is warm and dry. No rash noted.  Psychiatric: He has a normal mood and affect. His behavior is normal.      Assessment & Plan:   Levy Sjogrenmanullah was seen today for fall.  Diagnoses and all orders for this visit:  Right hip pain Orders: -     DG HIP UNILAT WITH PELVIS 2-3 VIEWS RIGHT; Future  Contusion, hip, right, initial encounter  Other orders -     naproxen sodium (ANAPROX DS) 550 MG tablet; Take 1 tablet (550 mg total) by mouth 2 (two) times daily with a meal.   I am having Mr.  Welton FlakesKhan start on naproxen sodium. I am also having him maintain his aspirin, BD ULTRA-FINE PEN NEEDLES, cycloSPORINE, predniSONE, mycophenolate, fluconazole, insulin detemir, insulin aspart, carvedilol, prasugrel, and rosuvastatin.  Meds ordered this encounter  Medications  . naproxen sodium (ANAPROX DS) 550 MG tablet    Sig: Take 1 tablet (550 mg total) by mouth 2 (two) times daily with a meal.    Dispense:  40 tablet    Refill:  0   He was instructed to use crutches for nonweightbearing apply ice and take Anaprox for pain. He'll follow-up should he have no improvement.  Appropriate red flag conditions were discussed with the patient as well as actions that should be taken.  Patient expressed his understanding.  Follow-up: Return if symptoms worsen or fail to improve.  Carmelina DaneAnderson, Tkeyah Burkman S, MD  [facility] reading (PRIMARY) by  Dr. Dareen PianoAnderson negative.

## 2014-09-06 ENCOUNTER — Encounter (HOSPITAL_COMMUNITY): Payer: Self-pay | Admitting: General Practice

## 2014-09-06 ENCOUNTER — Encounter (HOSPITAL_COMMUNITY)
Admission: RE | Disposition: A | Discharge status: Home / Self Care | Payer: Self-pay | Source: Ambulatory Visit | Attending: Cardiology

## 2014-09-06 ENCOUNTER — Ambulatory Visit (HOSPITAL_COMMUNITY)
Admission: RE | Admit: 2014-09-06 | Discharge: 2014-09-07 | Disposition: A | Discharge status: Home / Self Care | Payer: PPO | Source: Ambulatory Visit | Attending: Cardiology | Admitting: Cardiology

## 2014-09-06 DIAGNOSIS — IMO0002 Reserved for concepts with insufficient information to code with codable children: Secondary | ICD-10-CM | POA: Diagnosis present

## 2014-09-06 DIAGNOSIS — E1122 Type 2 diabetes mellitus with diabetic chronic kidney disease: Secondary | ICD-10-CM | POA: Insufficient documentation

## 2014-09-06 DIAGNOSIS — D6489 Other specified anemias: Secondary | ICD-10-CM | POA: Insufficient documentation

## 2014-09-06 DIAGNOSIS — E785 Hyperlipidemia, unspecified: Secondary | ICD-10-CM | POA: Diagnosis not present

## 2014-09-06 DIAGNOSIS — N2581 Secondary hyperparathyroidism of renal origin: Secondary | ICD-10-CM | POA: Diagnosis not present

## 2014-09-06 DIAGNOSIS — F419 Anxiety disorder, unspecified: Secondary | ICD-10-CM | POA: Insufficient documentation

## 2014-09-06 DIAGNOSIS — I5022 Chronic systolic (congestive) heart failure: Secondary | ICD-10-CM | POA: Diagnosis not present

## 2014-09-06 DIAGNOSIS — Z940 Kidney transplant status: Secondary | ICD-10-CM | POA: Diagnosis not present

## 2014-09-06 DIAGNOSIS — F329 Major depressive disorder, single episode, unspecified: Secondary | ICD-10-CM | POA: Insufficient documentation

## 2014-09-06 DIAGNOSIS — E1165 Type 2 diabetes mellitus with hyperglycemia: Secondary | ICD-10-CM | POA: Diagnosis not present

## 2014-09-06 DIAGNOSIS — Z7982 Long term (current) use of aspirin: Secondary | ICD-10-CM | POA: Diagnosis not present

## 2014-09-06 DIAGNOSIS — I2510 Atherosclerotic heart disease of native coronary artery without angina pectoris: Secondary | ICD-10-CM | POA: Diagnosis present

## 2014-09-06 DIAGNOSIS — Z7952 Long term (current) use of systemic steroids: Secondary | ICD-10-CM | POA: Insufficient documentation

## 2014-09-06 DIAGNOSIS — I120 Hypertensive chronic kidney disease with stage 5 chronic kidney disease or end stage renal disease: Secondary | ICD-10-CM | POA: Insufficient documentation

## 2014-09-06 DIAGNOSIS — Z79899 Other long term (current) drug therapy: Secondary | ICD-10-CM | POA: Diagnosis not present

## 2014-09-06 DIAGNOSIS — N186 End stage renal disease: Secondary | ICD-10-CM | POA: Insufficient documentation

## 2014-09-06 DIAGNOSIS — E1140 Type 2 diabetes mellitus with diabetic neuropathy, unspecified: Secondary | ICD-10-CM | POA: Insufficient documentation

## 2014-09-06 DIAGNOSIS — Z794 Long term (current) use of insulin: Secondary | ICD-10-CM | POA: Diagnosis not present

## 2014-09-06 DIAGNOSIS — I252 Old myocardial infarction: Secondary | ICD-10-CM | POA: Insufficient documentation

## 2014-09-06 DIAGNOSIS — Z955 Presence of coronary angioplasty implant and graft: Secondary | ICD-10-CM

## 2014-09-06 DIAGNOSIS — Z992 Dependence on renal dialysis: Secondary | ICD-10-CM | POA: Diagnosis not present

## 2014-09-06 DIAGNOSIS — Z7902 Long term (current) use of antithrombotics/antiplatelets: Secondary | ICD-10-CM | POA: Diagnosis not present

## 2014-09-06 HISTORY — DX: Pneumonia, unspecified organism: J18.9

## 2014-09-06 HISTORY — PX: CORONARY ANGIOPLASTY WITH STENT PLACEMENT: SHX49

## 2014-09-06 HISTORY — DX: Atherosclerotic heart disease of native coronary artery without angina pectoris: I25.10

## 2014-09-06 HISTORY — DX: Non-ST elevation (NSTEMI) myocardial infarction: I21.4

## 2014-09-06 HISTORY — DX: Type 2 diabetes mellitus without complications: E11.9

## 2014-09-06 HISTORY — PX: CARDIAC CATHETERIZATION: SHX172

## 2014-09-06 HISTORY — DX: Anemia in other chronic diseases classified elsewhere: D63.8

## 2014-09-06 HISTORY — DX: Kidney transplant status: Z94.0

## 2014-09-06 LAB — GLUCOSE, CAPILLARY
GLUCOSE-CAPILLARY: 131 mg/dL — AB (ref 65–99)
Glucose-Capillary: 75 mg/dL (ref 65–99)
Glucose-Capillary: 118 mg/dL — ABNORMAL HIGH (ref 65–99)
Glucose-Capillary: 221 mg/dL — ABNORMAL HIGH (ref 65–99)

## 2014-09-06 LAB — POCT ACTIVATED CLOTTING TIME: Activated Clotting Time: 386 seconds

## 2014-09-06 SURGERY — LEFT HEART CATH AND CORONARY ANGIOGRAPHY
Anesthesia: LOCAL

## 2014-09-06 MED ORDER — INSULIN DETEMIR 100 UNIT/ML ~~LOC~~ SOLN
10.0000 [IU] | Freq: Two times a day (BID) | SUBCUTANEOUS | Status: DC
  Administered 2014-09-06 – 2014-09-07 (×2): 10 [IU] via SUBCUTANEOUS
  Filled 2014-09-06 (×3): qty 0.1

## 2014-09-06 MED ORDER — LIDOCAINE HCL (PF) 1 % IJ SOLN
INTRAMUSCULAR | Status: AC
  Filled 2014-09-06: qty 30

## 2014-09-06 MED ORDER — ASPIRIN 81 MG PO CHEW
81.0000 mg | CHEWABLE_TABLET | ORAL | Status: AC
  Administered 2014-09-06: 81 mg via ORAL

## 2014-09-06 MED ORDER — ONDANSETRON HCL 4 MG/2ML IJ SOLN: 4.0000 mg | Freq: Four times a day (QID) | INTRAMUSCULAR | Status: DC | PRN

## 2014-09-06 MED ORDER — NITROGLYCERIN IN D5W 200-5 MCG/ML-% IV SOLN
INTRAVENOUS | Status: DC | PRN
  Administered 2014-09-06: 5 ug/min via INTRAVENOUS

## 2014-09-06 MED ORDER — CARVEDILOL 3.125 MG PO TABS
6.2500 mg | ORAL_TABLET | Freq: Every day | ORAL | Status: DC
  Administered 2014-09-06 – 2014-09-07 (×3): 6.25 mg via ORAL
  Filled 2014-09-06 (×4): qty 2

## 2014-09-06 MED ORDER — NITROGLYCERIN 1 MG/10 ML FOR IR/CATH LAB
INTRA_ARTERIAL | Status: AC
  Filled 2014-09-06: qty 10

## 2014-09-06 MED ORDER — NITROGLYCERIN IN D5W 200-5 MCG/ML-% IV SOLN
INTRAVENOUS | Status: AC
  Filled 2014-09-06: qty 250

## 2014-09-06 MED ORDER — PRASUGREL HCL 10 MG PO TABS: 10.0000 mg | ORAL_TABLET | Freq: Every day | ORAL | Status: DC

## 2014-09-06 MED ORDER — CYCLOSPORINE 100 MG PO CAPS
100.0000 mg | ORAL_CAPSULE | Freq: Two times a day (BID) | ORAL | Status: DC
  Administered 2014-09-06 – 2014-09-07 (×3): 100 mg via ORAL
  Filled 2014-09-06 (×4): qty 1

## 2014-09-06 MED ORDER — HEPARIN (PORCINE) IN NACL 2-0.9 UNIT/ML-% IJ SOLN
INTRAMUSCULAR | Status: AC
Start: 2014-09-06 — End: 2014-09-06
  Filled 2014-09-06: qty 1000

## 2014-09-06 MED ORDER — DEXTROSE-NACL 5-0.45 % IV SOLN
Freq: Once | INTRAVENOUS | Status: AC
  Administered 2014-09-06: 09:00:00 via INTRAVENOUS

## 2014-09-06 MED ORDER — ASPIRIN 81 MG PO TABS: 81.0000 mg | ORAL_TABLET | Freq: Every day | ORAL | Status: DC

## 2014-09-06 MED ORDER — ATROPINE SULFATE 0.1 MG/ML IJ SOLN
INTRAMUSCULAR | Status: AC
  Filled 2014-09-06: qty 10

## 2014-09-06 MED ORDER — NITROGLYCERIN IN D5W 200-5 MCG/ML-% IV SOLN: 5.0000 ug/min | INTRAVENOUS | Status: DC

## 2014-09-06 MED ORDER — FENTANYL CITRATE (PF) 100 MCG/2ML IJ SOLN
INTRAMUSCULAR | Status: AC
  Filled 2014-09-06: qty 4

## 2014-09-06 MED ORDER — MIDAZOLAM HCL 2 MG/2ML IJ SOLN
INTRAMUSCULAR | Status: DC | PRN
  Administered 2014-09-06: 1 mg via INTRAVENOUS

## 2014-09-06 MED ORDER — ASPIRIN 81 MG PO CHEW
81.0000 mg | CHEWABLE_TABLET | Freq: Every day | ORAL | Status: DC
  Administered 2014-09-07: 11:00:00 81 mg via ORAL
  Filled 2014-09-06: qty 1

## 2014-09-06 MED ORDER — SODIUM CHLORIDE 0.9 % WEIGHT BASED INFUSION
3.0000 mL/kg/h | INTRAVENOUS | Status: DC
Start: 2014-09-07 — End: 2014-09-06
  Administered 2014-09-06: 3 mL/kg/h via INTRAVENOUS

## 2014-09-06 MED ORDER — BIVALIRUDIN 250 MG IV SOLR
INTRAVENOUS | Status: AC
  Filled 2014-09-06: qty 250

## 2014-09-06 MED ORDER — IOHEXOL 350 MG/ML SOLN
INTRAVENOUS | Status: DC | PRN
  Administered 2014-09-06: 150 mL via INTRA_ARTERIAL

## 2014-09-06 MED ORDER — PRASUGREL HCL 10 MG PO TABS
10.0000 mg | ORAL_TABLET | Freq: Every day | ORAL | Status: DC
  Administered 2014-09-07: 10 mg via ORAL
  Filled 2014-09-06: qty 1

## 2014-09-06 MED ORDER — SODIUM CHLORIDE 0.9 % IJ SOLN
3.0000 mL | Freq: Two times a day (BID) | INTRAMUSCULAR | Status: DC
  Administered 2014-09-06: 20:00:00 3 mL via INTRAVENOUS

## 2014-09-06 MED ORDER — ASPIRIN 81 MG PO CHEW
CHEWABLE_TABLET | ORAL | Status: AC
  Filled 2014-09-06: qty 1

## 2014-09-06 MED ORDER — OXYCODONE-ACETAMINOPHEN 5-325 MG PO TABS
1.0000 | ORAL_TABLET | ORAL | Status: DC | PRN
  Administered 2014-09-06: 1 via ORAL
  Filled 2014-09-06: qty 1

## 2014-09-06 MED ORDER — MIDAZOLAM HCL 2 MG/2ML IJ SOLN
INTRAMUSCULAR | Status: AC
Start: 2014-09-06 — End: 2014-09-06
  Filled 2014-09-06: qty 4

## 2014-09-06 MED ORDER — SODIUM CHLORIDE 0.9 % IV SOLN: 250.0000 mL | INTRAVENOUS | Status: DC | PRN

## 2014-09-06 MED ORDER — INSULIN ASPART 100 UNIT/ML ~~LOC~~ SOLN
0.0000 [IU] | Freq: Three times a day (TID) | SUBCUTANEOUS | Status: DC
  Administered 2014-09-06: 1 [IU] via SUBCUTANEOUS
  Administered 2014-09-07: 07:00:00 3 [IU] via SUBCUTANEOUS
  Administered 2014-09-07: 13:00:00 5 [IU] via SUBCUTANEOUS

## 2014-09-06 MED ORDER — FLUCONAZOLE 150 MG PO TABS
150.0000 mg | ORAL_TABLET | Freq: Every day | ORAL | Status: DC
  Administered 2014-09-06 – 2014-09-07 (×2): 150 mg via ORAL
  Filled 2014-09-06 (×2): qty 1

## 2014-09-06 MED ORDER — BIVALIRUDIN BOLUS VIA INFUSION - CUPID
INTRAVENOUS | Status: DC | PRN
  Administered 2014-09-06: 47.625 mg via INTRAVENOUS

## 2014-09-06 MED ORDER — SODIUM CHLORIDE 0.9 % IV SOLN: INTRAVENOUS | Status: DC | PRN

## 2014-09-06 MED ORDER — MYCOPHENOLATE SODIUM 180 MG PO TBEC
180.0000 mg | DELAYED_RELEASE_TABLET | Freq: Every day | ORAL | Status: DC
  Administered 2014-09-06 – 2014-09-07 (×2): 180 mg via ORAL
  Filled 2014-09-06 (×2): qty 1

## 2014-09-06 MED ORDER — SODIUM CHLORIDE 0.9 % WEIGHT BASED INFUSION: 1.0000 mL/kg/h | INTRAVENOUS | Status: DC

## 2014-09-06 MED ORDER — SODIUM CHLORIDE 0.9 % IJ SOLN: 3.0000 mL | INTRAMUSCULAR | Status: DC | PRN

## 2014-09-06 MED ORDER — FENTANYL CITRATE (PF) 100 MCG/2ML IJ SOLN
INTRAMUSCULAR | Status: DC | PRN
  Administered 2014-09-06: 25 ug via INTRAVENOUS

## 2014-09-06 MED ORDER — ACETAMINOPHEN 325 MG PO TABS: 650.0000 mg | ORAL_TABLET | ORAL | Status: DC | PRN

## 2014-09-06 MED ORDER — SODIUM CHLORIDE 0.9 % IJ SOLN: 3.0000 mL | Freq: Two times a day (BID) | INTRAMUSCULAR | Status: DC

## 2014-09-06 MED ORDER — PRASUGREL HCL 10 MG PO TABS
ORAL_TABLET | ORAL | Status: AC
  Filled 2014-09-06: qty 3

## 2014-09-06 MED ORDER — SODIUM CHLORIDE 0.9 % IV SOLN: INTRAVENOUS | Status: DC

## 2014-09-06 MED ORDER — PREDNISONE 5 MG PO TABS
5.0000 mg | ORAL_TABLET | Freq: Every day | ORAL | Status: DC
  Administered 2014-09-06 – 2014-09-07 (×2): 5 mg via ORAL
  Filled 2014-09-06 (×2): qty 1

## 2014-09-06 MED ORDER — BIVALIRUDIN 250 MG IV SOLR
250.0000 mg | INTRAVENOUS | Status: DC | PRN
  Administered 2014-09-06: 1.75 mg/kg/h via INTRAVENOUS

## 2014-09-06 MED ORDER — ROSUVASTATIN CALCIUM 10 MG PO TABS
5.0000 mg | ORAL_TABLET | Freq: Every day | ORAL | Status: DC
  Administered 2014-09-06: 5 mg via ORAL
  Filled 2014-09-06: qty 1

## 2014-09-06 MED ORDER — PRASUGREL HCL 10 MG PO TABS
ORAL_TABLET | ORAL | Status: DC | PRN
  Administered 2014-09-06: 30 mg via ORAL

## 2014-09-06 MED ORDER — DEXTROSE-NACL 5-0.45 % IV SOLN: INTRAVENOUS | Status: DC | PRN

## 2014-09-06 MED ORDER — NITROGLYCERIN 1 MG/10 ML FOR IR/CATH LAB
INTRA_ARTERIAL | Status: DC | PRN
  Administered 2014-09-06: 10:00:00

## 2014-09-06 MED ORDER — ASPIRIN 81 MG PO CHEW: 81.0000 mg | CHEWABLE_TABLET | Freq: Every day | ORAL | Status: DC

## 2014-09-06 SURGICAL SUPPLY — 21 items
BALLN EMERGE MR 2.5X12 (BALLOONS) ×2
BALLN ~~LOC~~ EMERGE MR 2.75X20 (BALLOONS) ×2
BALLN ~~LOC~~ EMERGE MR 3.5X30 (BALLOONS) ×2
BALLN ~~LOC~~ TREK RX 3.0X20 (BALLOONS) ×2
BALLOON EMERGE MR 2.5X12 (BALLOONS) IMPLANT
BALLOON ~~LOC~~ EMERGE MR 2.75X20 (BALLOONS) IMPLANT
BALLOON ~~LOC~~ EMERGE MR 3.5X30 (BALLOONS) IMPLANT
BALLOON ~~LOC~~ TREK RX 3.0X20 (BALLOONS) IMPLANT
CATH DIAG 6FR JL4 (CATHETERS) ×1 IMPLANT
CATH VISTA GUIDE 6FR AL1 (CATHETERS) ×1 IMPLANT
GUIDE CATH RUNWAY 6FR AL 75 (CATHETERS) ×1 IMPLANT
KIT ENCORE 26 ADVANTAGE (KITS) ×1 IMPLANT
KIT HEART LEFT (KITS) ×2 IMPLANT
PACK CARDIAC CATHETERIZATION (CUSTOM PROCEDURE TRAY) ×2 IMPLANT
SHEATH PINNACLE 6F 10CM (SHEATH) ×1 IMPLANT
STENT XIENCE ALPINE RX 2.75X38 (Permanent Stent) ×1 IMPLANT
STENT XIENCE ALPINE RX 3.0X38 (Permanent Stent) ×1 IMPLANT
STOPCOCK MORSE 400PSI 3WAY (MISCELLANEOUS) ×1 IMPLANT
TRANSDUCER W/STOPCOCK (MISCELLANEOUS) ×2 IMPLANT
WIRE EMERALD 3MM-J .035X150CM (WIRE) ×2 IMPLANT
WIRE RUNTHROUGH .014X180CM (WIRE) ×2 IMPLANT

## 2014-09-06 NOTE — H&P (Signed)
Printed H&P in the chart needs to be scanned 

## 2014-09-06 NOTE — Progress Notes (Signed)
Subjective:  Doing well denies any chest pain or shortness of breath. Underwent PCI to RCA with excellent angiographic results. Noted to have patent stents in LAD diagonal and left circumflex system  Objective:  Vital Signs in the last 24 hours: Temp:  [97.7 F (36.5 C)-98.3 F (36.8 C)] 98.3 F (36.8 C) (08/04 1256) Pulse Rate:  [68-90] 68 (08/04 1630) Resp:  [11-22] 18 (08/04 1630) BP: (89-178)/(40-89) 89/50 mmHg (08/04 1630) SpO2:  [95 %-100 %] 98 % (08/04 1630) Weight:  [63.504 kg (140 lb)] 63.504 kg (140 lb) (08/04 0710)  Intake/Output from previous day:   Intake/Output from this shift: Total I/O In: 450 [I.V.:450] Out: -   Physical Exam: Neck: no adenopathy, no carotid bruit, no JVD and supple, symmetrical, trachea midline Lungs: clear to auscultation bilaterally Heart: regular rate and rhythm, S1, S2 normal and Soft systolic murmur noted Abdomen: soft, non-tender; bowel sounds normal; no masses,  no organomegaly Extremities: extremities normal, atraumatic, no cyanosis or edema and Right groin stable  Lab Results: No results for input(s): WBC, HGB, PLT in the last 72 hours. No results for input(s): NA, K, CL, CO2, GLUCOSE, BUN, CREATININE in the last 72 hours. No results for input(s): TROPONINI in the last 72 hours.  Invalid input(s): CK, MB Hepatic Function Panel No results for input(s): PROT, ALBUMIN, AST, ALT, ALKPHOS, BILITOT, BILIDIR, IBILI in the last 72 hours. No results for input(s): CHOL in the last 72 hours. No results for input(s): PROTIME in the last 72 hours.  Imaging: Imaging results have been reviewed and No results found.  Cardiac Studies:  Assessment/Plan:  Multivessel CAD status post left cath/PTCA stenting to RCA with excellent angiographic results Status post recent small non-Q-wave myocardial infarction status post PCI to left circumflex/obtuse marginal/LAD diagonal Compensated systolic congestive heart failure Hypertension Insulin  requiring diabetes mellitus uncontrolled Peripheral neuropathy Hypercholesteremia Anemia of chronic disease Depression Anxiety disorder History of end-stage renal disease status post renal transplant on chronic immunosuppressive therapy Plan Continue present management Check labs in a.m. Phase I cardiac rehabilitation      Steven Riddle 09/06/2014, 4:46 PM

## 2014-09-06 NOTE — Interval H&P Note (Signed)
Cath Lab Visit (complete for each Cath Lab visit)  Clinical Evaluation Leading to the Procedure:   ACS: No.  Non-ACS:    Anginal Classification: CCS III  Anti-ischemic medical therapy: Maximal Therapy (2 or more classes of medications)  Non-Invasive Test Results: No non-invasive testing performed  Prior CABG: No previous CABG      History and Physical Interval Note:  09/06/2014 8:50 AM  Steven Riddle  has presented today for surgery, with the diagnosis of Chest Pain  The various methods of treatment have been discussed with the patient and family. After consideration of risks, benefits and other options for treatment, the patient has consented to  Procedure(s): Left Heart Cath and Coronary Angiography (N/A) as a surgical intervention .  The patient's history has been reviewed, patient examined, no change in status, stable for surgery.  I have reviewed the patient's chart and labs.  Questions were answered to the patient's satisfaction.     [name]

## 2014-09-06 NOTE — Progress Notes (Signed)
Site area: right groin  Site Prior to Removal:  Level 0  Pressure Applied For 20 MINUTES    Minutes Beginning at 1555  Manual:   Yes.    Patient Status During Pull:  AAO X 4  Post Pull Groin Site:  Level 0  Post Pull Instructions Given:  Yes.    Post Pull Pulses Present:  Yes.    Dressing Applied:  Yes.    Comments:  Tolerated procedure well

## 2014-09-07 ENCOUNTER — Encounter (HOSPITAL_COMMUNITY): Payer: Self-pay | Admitting: Cardiology

## 2014-09-07 DIAGNOSIS — I2510 Atherosclerotic heart disease of native coronary artery without angina pectoris: Secondary | ICD-10-CM | POA: Diagnosis not present

## 2014-09-07 DIAGNOSIS — I252 Old myocardial infarction: Secondary | ICD-10-CM | POA: Diagnosis not present

## 2014-09-07 DIAGNOSIS — E1122 Type 2 diabetes mellitus with diabetic chronic kidney disease: Secondary | ICD-10-CM | POA: Diagnosis not present

## 2014-09-07 DIAGNOSIS — I5022 Chronic systolic (congestive) heart failure: Secondary | ICD-10-CM | POA: Diagnosis not present

## 2014-09-07 LAB — GLUCOSE, CAPILLARY
GLUCOSE-CAPILLARY: 276 mg/dL — AB (ref 65–99)
Glucose-Capillary: 226 mg/dL — ABNORMAL HIGH (ref 65–99)

## 2014-09-07 LAB — CBC
HCT: 38.8 % — ABNORMAL LOW (ref 39.0–52.0)
HEMOGLOBIN: 12.5 g/dL — AB (ref 13.0–17.0)
MCH: 27.3 pg (ref 26.0–34.0)
MCHC: 32.2 g/dL (ref 30.0–36.0)
MCV: 84.7 fL (ref 78.0–100.0)
Platelets: 186 10*3/uL (ref 150–400)
RBC: 4.58 MIL/uL (ref 4.22–5.81)
RDW: 15.5 % (ref 11.5–15.5)
WBC: 9.5 10*3/uL (ref 4.0–10.5)

## 2014-09-07 LAB — BASIC METABOLIC PANEL
Anion gap: 10 (ref 5–15)
BUN: 19 mg/dL (ref 6–20)
CALCIUM: 8.7 mg/dL — AB (ref 8.9–10.3)
CO2: 24 mmol/L (ref 22–32)
CREATININE: 1.03 mg/dL (ref 0.61–1.24)
Chloride: 102 mmol/L (ref 101–111)
Glucose, Bld: 303 mg/dL — ABNORMAL HIGH (ref 65–99)
POTASSIUM: 4.3 mmol/L (ref 3.5–5.1)
SODIUM: 136 mmol/L (ref 135–145)

## 2014-09-07 NOTE — Progress Notes (Signed)
CARDIAC REHAB PHASE I   PRE:  Rate/Rhythm: 81 SR    BP: sitting 162/71    SaO2:   MODE:  Ambulation: 300 ft   POST:  Rate/Rhythm: 83 SR    BP: sitting 143/48     SaO2:   Pt walked slowly with RW. HR only minimally increases (this was true for him in May as well). Pt lacks endurance/energy but no specific c/o. Reviewed ed. Interested in CRPII and will send referral to [place]. Sts he can't do Fridays though, he has something in the afternoons. Encouraged him to do morning class. I called CM re: Effient to see if he can get it cheaper. Pt understands its importance. [phone]   Harriet Masson CES, ACSM 09/07/2014 9:07 AM

## 2014-09-07 NOTE — Progress Notes (Addendum)
Patient has been waiting for family to come take him home.  I was awaiting family's  arrival to go over DC instructions given his language barrier.  Patient found to have left room at 1515.  Left message on wife's cell and home phone. Spoke with family on the phone and confirmed receipt of AVS.

## 2014-09-07 NOTE — Discharge Summary (Signed)
Discharge summary dictated on 09/07/2014 dictation number is [phone]

## 2014-09-07 NOTE — Discharge Instructions (Signed)
Coronary Angiogram With Stent, Care After °Refer to this sheet in the next few weeks. These instructions provide you with information on caring for yourself after your procedure. Your health care provider may also give you more specific instructions. Your treatment has been planned according to current medical practices, but problems sometimes occur. Call your health care provider if you have any problems or questions after your procedure.  °WHAT TO EXPECT AFTER THE PROCEDURE  °The insertion site may be tender for a few days after your procedure. °HOME CARE INSTRUCTIONS  °· Take medicines only as directed by your health care provider. Blood thinners may be prescribed after your procedure to improve blood flow through the stent. °· Change any bandages (dressings) as directed by your health care provider.   °· Check your insertion site every day for redness, swelling, or fluid leaking from the insertion.   °· Do not take baths, swim, or use a hot tub until your health care provider approves. You may shower. Pat the insertion area dry. Do not rub the insertion area with a washcloth or towel.   °· Eat a heart-healthy diet. This should include plenty of fresh fruits and vegetables. Meat should be lean cuts. Avoid the following types of food:   °¨ Food that is high in salt.   °¨ Canned or highly processed food.   °¨ Food that is high in saturated fat or sugar.   °¨ Fried food.   °· Make any other lifestyle changes recommended by your health care provider. This may include:   °¨ Not using any tobacco products including cigarettes, chewing tobacco, or electronic cigarettes.  °¨ Managing your weight.   °¨ Getting regular exercise.   °¨ Managing your blood pressure.   °¨ Limiting your alcohol intake.   °¨ Managing other health problems, such as diabetes.   °· If you need an MRI after your heart stent was placed, be sure to tell the health care provider who orders the MRI that you have a heart stent.   °· Keep all follow-up  visits as directed by your health care provider.   °SEEK IMMEDIATE MEDICAL CARE IF:  °· You develop chest pain, shortness of breath, feel faint, or pass out. °· You have bleeding, swelling larger than a walnut, or drainage from the catheter insertion site. °· You develop pain, discoloration, coldness, or severe bruising in the leg or arm that held the catheter. °· You develop bleeding from any other place such as from the bowels. There may be bright red blood in the urine or stools, or it may appear as black, tarry stools. °· You have a fever or chills. °MAKE SURE YOU: °· Understand these instructions. °· Will watch your condition. °· Will get help right away if you are not doing well or get worse. °Document Released: 08/08/2004 Document Revised: 06/05/2013 Document Reviewed: 06/22/2012 °ExitCare® Patient Information ©2015 [employer]. This information is not intended to replace advice given to you by your health care provider. Make sure you discuss any questions you have with your health care provider. ° °

## 2014-09-08 NOTE — Discharge Summary (Signed)
Steven Riddle, Steven Riddle              ACCOUNT NO.:  [id number]  MEDICAL RECORD NO.:  [id number]  LOCATION:  6C07C                        FACILITY:  [facility]  PHYSICIAN:  Chai Verdejo N. Sharyn Lull, M.D. DATE OF BIRTH:  05-02-1955  DATE OF ADMISSION:  09/06/2014 DATE OF DISCHARGE:  09/07/2014                              DISCHARGE SUMMARY   ADMITTING DIAGNOSES: 1. Multivessel coronary artery disease, history of silent anteroseptal     wall myocardial infarction in the past, also had non-Q-wave     myocardial infarction in April of 2016, subsequently noted to have     multivessel coronary artery disease, requiring percutaneous     transluminal coronary angioplasty stenting to left     circumflex/obtuse marginal 2 and left anterior descending coronary     artery and diagonal 1 in May of 2016. 2. Hypertension. 3. Compensated systolic congestive heart failure. 4. Diabetes mellitus. 5. Diabetic neuropathy. 6. Hypertension. 7. Hypercholesteremia. 8. End-stage renal disease, status post renal transplant. 9. Depression. 10.Peripheral vascular disease.  DISCHARGE DIAGNOSES: 1. Multivessel coronary artery disease, status post left cardiac     cath/percutaneous transluminal coronary angioplasty stenting to     right coronary artery with excellent angiographic results. 2. Status post recent small non-Q-wave myocardial infarction status     post staged percutaneous coronary intervention to left     circumflex/obtuse marginal/left anterior descending coronary artery     and diagonal in May of 2016. 3. Compensated systolic heart failure. 4. Hypertension. 5. Insulin-requiring diabetes mellitus, uncontrolled. 6. Peripheral neuropathy. 7. Hypercholesteremia. 8. Anemia of chronic disease. 9. Depression. 10.Anxiety disorder. 11.History of end-stage renal disease, status post renal transplant on     chronic immunosuppressive therapy.  DISCHARGE HOME MEDICATIONS: 1. Aspirin 81 mg 1 tablet daily. 2.  Carvedilol 6.25 mg twice daily. 3. Prasugrel 10 mg daily. 4. Crestor 5 mg daily. 5. Cyclosporine 100 mg 2 times daily as before. 6. Alphagan eyedrops as before. 7. Cosopt eyedrops as before. 8. Diflucan 150 mg daily as before. 9. Xalatan eyedrops as before. 10.Myfortic 180 mg daily as before. 11.Levemir insulin 15 units in the morning and 20 units in the evening     as before. 12.Nitrostat sublingual use as directed.  DIET:  Low-salt, low-cholesterol 1800 calories ADA diet.  The patient has been advised to monitor blood pressure and blood sugar daily and chart.  Post cardiac cath/PTCA stent instructions have been given.  FOLLOWUP:  With me in 1 week.  Follow up with PMD as scheduled.  The patient will discuss regarding increasing the dose of insulin with his PMD.  CONDITION ON DISCHARGE:  Stable.  BRIEF HISTORY AND HOSPITAL COURSE:  Steven Riddle is 59 year old male with past medical history significant for multiple medical problems, i.e., coronary artery disease, history of silent anteroseptal wall myocardial infarction in the past, history of non-Q-wave myocardial infarction in April of 2016, noted to have multivessel CAD, refused for CABG, subsequently underwent staged PCI to left circumflex and obtuse marginal followed by LAD and diagonal 1, noted to have multiple sequential proximal mid and distal RCA stenosis, hypertension, insulin-requiring diabetes mellitus, history of congestive heart failure secondary to depressed LV systolic function, end-stage renal disease, status post renal  transplant in the past, peripheral neuropathy, anemia of chronic disease, depression, anxiety disorder, secondary hyperparathyroidism, came to the office complaining of feeling weak, tired, fatigue, no energy.  The patient denies any chest pain, nausea, vomiting, diaphoresis.  States his activity is limited.  Denies palpitation, lightheadedness, or syncope.  Denies PND, orthopnea, or leg  swelling. The patient states he is ready for elective PCI to RCA.  PHYSICAL EXAMINATION:  GENERAL:  On examination, he was alert, awake, oriented x3 in no acute distress. VITAL SIGNS:  Blood pressure was 153/82, pulse 71 regular. HEENT:  Conjunctivae were pink. NECK:  Supple.  No JVD.  No bruit. LUNGS:  Clear to auscultation without rhonchi or rales. CARDIOVASCULAR:  S1, S2 was normal.  There was soft systolic murmur. ABDOMEN:  Soft.  Bowel sounds were present.  Nontender. EXTREMITIES:  There was no clubbing, cyanosis, or edema. NEUROLOGIC:  Grossly intact.  BRIEF HOSPITAL COURSE:  The patient was a.m. admit and underwent left cardiac cath and PTCA stenting to RCA as per procedure report.  The patient tolerated the procedure well.  The patient was noted to have all patent stents, which were placed in May of 2016.  The patient did not have any episodes of chest pain after PCI to RCA.  His groin is stable with no evidence of hematoma or bruit.  Phase 1 cardiac rehab was called.  The patient has been ambulating in room without any problems. The patient will be discharged home on above medications and will be followed up in my office in 1 week.  The patient has been strongly encouraged to join phase 2 cardiac rehab which he agrees.  Patient has been also discussed at length regarding lifestyle changes, diet, exercise compliance with medication, and followup.     Eduardo Osier. Sharyn Lull, M.D.     [facility]  D:  09/07/2014  T:  09/08/2014  Job:  [id number]

## 2014-09-18 ENCOUNTER — Telehealth (HOSPITAL_COMMUNITY): Payer: Self-pay | Admitting: Cardiac Rehabilitation

## 2014-09-18 NOTE — Telephone Encounter (Signed)
pc to enroll pt in cardiac  rehab program. Pt unavailable, spoke to his wife Eunice Blase.  She will discuss with him to assess interest and call us back to schedule.

## 2014-10-01 ENCOUNTER — Telehealth (HOSPITAL_COMMUNITY): Payer: Self-pay | Admitting: Cardiac Rehabilitation

## 2014-10-01 NOTE — Telephone Encounter (Signed)
pc to pt to discuss enrolling in cardiac rehab. Pt wife states pt is interested however is not ready. They will have difficulty with transportation. Telephone number for [facility] given to them. Pt wife will call to request.  Pt uncomfortable verifying his insurance.

## 2014-10-02 ENCOUNTER — Telehealth: Payer: Self-pay

## 2014-10-02 NOTE — Telephone Encounter (Signed)
Pt will come get A1c checked when better

## 2015-01-10 ENCOUNTER — Encounter (HOSPITAL_COMMUNITY)
Admission: RE | Admit: 2015-01-10 | Discharge: 2015-01-10 | Disposition: A | Discharge status: Home / Self Care | Payer: PPO | Source: Ambulatory Visit | Attending: Cardiology | Admitting: Cardiology

## 2015-01-10 DIAGNOSIS — Z955 Presence of coronary angioplasty implant and graft: Secondary | ICD-10-CM | POA: Insufficient documentation

## 2015-01-10 NOTE — Progress Notes (Signed)
Cardiac Rehab Medication Review by a Pharmacist  Does the patient  feel that his/her medications are working for him/her?  yes  Has the patient been experiencing any side effects to the medications prescribed?  no  Does the patient measure his/her own blood pressure or blood glucose at home?  yes   Does the patient have any problems obtaining medications due to transportation or finances?   Yes - Effient cost  Understanding of regimen: good Understanding of indications: good Potential of compliance: excellent    Pharmacist comments:  [name] is a pleasant 59 yo M presenting with his wife and daughter but without a current medication list. He and his wife report excellent compliance with his medications and they seem to understand the importance of consistent use. They do report that his Effient is very costly for them even through their Medicare Part D insurance. Since he has Medicare, he cannot use the manufacturer coupon and they have applied for patient assistance through [employer] which was denied. The [employer] unfortunately does not have a grant that covers Effient. I advised the patient and his family to have a discussion with Dr. Sharyn LullHarwani about considering a switch to Plavix since he has been on Effient for 4 months now, if cost continues to be a limiting factor. We did discuss the differences in outcomes between the two and they verbalized understanding.   Steven Riddle, PharmD, BCPS, CPP Clinical Pharmacist Pager: [phone] Phone: [phone] 01/10/2015 9:18 AM

## 2015-01-14 ENCOUNTER — Encounter (HOSPITAL_COMMUNITY)
Admission: RE | Admit: 2015-01-14 | Discharge: 2015-01-14 | Disposition: A | Discharge status: Home / Self Care | Payer: PPO | Source: Ambulatory Visit | Attending: Cardiology | Admitting: Cardiology

## 2015-01-14 ENCOUNTER — Encounter (HOSPITAL_COMMUNITY): Payer: Self-pay

## 2015-01-14 DIAGNOSIS — Z955 Presence of coronary angioplasty implant and graft: Secondary | ICD-10-CM | POA: Diagnosis present

## 2015-01-14 LAB — GLUCOSE, CAPILLARY
Glucose-Capillary: 177 mg/dL — ABNORMAL HIGH (ref 65–99)
Glucose-Capillary: 185 mg/dL — ABNORMAL HIGH (ref 65–99)

## 2015-01-14 NOTE — Progress Notes (Signed)
Pt started cardiac rehab today.  Pt tolerated light exercise without difficulty. VSS, telemetry-sinus rhythm, bundle branch block, asymptomatic.  Medication list reconciled.  Pt verbalized compliance with medications and denies barriers to compliance. PSYCHOSOCIAL ASSESSMENT:  PHQ-0. Pt exhibits positive coping skills, hopeful outlook with supportive family. No psychosocial needs identified at this time, no psychosocial interventions necessary.    Pt enjoys exercise, pt is former weight lifter.  Pt works as Estate manager/land agenthotel manager.   Pt cardiac rehab  goal is  to improve his balance and increase strength and stamina.  Pt encouraged to participate in home exercise in addition to cardiac rehab activities and stretching/functional fitness exercise  to increase ability to achieve these goals.   Pt oriented to exercise equipment and routine.  Understanding verbalized.

## 2015-01-16 ENCOUNTER — Encounter (HOSPITAL_COMMUNITY)
Admission: RE | Admit: 2015-01-16 | Discharge: 2015-01-16 | Disposition: A | Discharge status: Home / Self Care | Payer: PPO | Source: Ambulatory Visit | Attending: Cardiology | Admitting: Cardiology

## 2015-01-16 DIAGNOSIS — Z955 Presence of coronary angioplasty implant and graft: Secondary | ICD-10-CM | POA: Diagnosis not present

## 2015-01-16 LAB — GLUCOSE, CAPILLARY
GLUCOSE-CAPILLARY: 256 mg/dL — AB (ref 65–99)
GLUCOSE-CAPILLARY: 287 mg/dL — AB (ref 65–99)

## 2015-01-18 ENCOUNTER — Encounter (HOSPITAL_COMMUNITY): Payer: PPO

## 2015-01-21 ENCOUNTER — Encounter (HOSPITAL_COMMUNITY)
Admission: RE | Admit: 2015-01-21 | Discharge: 2015-01-21 | Disposition: A | Discharge status: Home / Self Care | Payer: PPO | Source: Ambulatory Visit | Attending: Cardiology | Admitting: Cardiology

## 2015-01-21 DIAGNOSIS — Z955 Presence of coronary angioplasty implant and graft: Secondary | ICD-10-CM | POA: Diagnosis not present

## 2015-01-21 LAB — GLUCOSE, CAPILLARY: Glucose-Capillary: 240 mg/dL — ABNORMAL HIGH (ref 65–99)

## 2015-01-23 ENCOUNTER — Encounter (HOSPITAL_COMMUNITY)
Admission: RE | Admit: 2015-01-23 | Discharge: 2015-01-23 | Disposition: A | Discharge status: Home / Self Care | Payer: PPO | Source: Ambulatory Visit | Attending: Cardiology | Admitting: Cardiology

## 2015-01-23 DIAGNOSIS — Z955 Presence of coronary angioplasty implant and graft: Secondary | ICD-10-CM | POA: Diagnosis not present

## 2015-01-23 LAB — GLUCOSE, CAPILLARY
Glucose-Capillary: 217 mg/dL — ABNORMAL HIGH (ref 65–99)
Glucose-Capillary: 243 mg/dL — ABNORMAL HIGH (ref 65–99)

## 2015-01-25 ENCOUNTER — Encounter (HOSPITAL_COMMUNITY): Payer: PPO

## 2015-01-30 ENCOUNTER — Encounter (HOSPITAL_COMMUNITY): Payer: PPO

## 2015-02-01 ENCOUNTER — Encounter (HOSPITAL_COMMUNITY): Admission: RE | Admit: 2015-02-01 | Payer: PPO | Source: Ambulatory Visit

## 2015-02-06 ENCOUNTER — Telehealth (HOSPITAL_COMMUNITY): Payer: Self-pay | Admitting: Endocrinology

## 2015-02-06 ENCOUNTER — Encounter (HOSPITAL_COMMUNITY): Payer: PPO

## 2015-02-08 ENCOUNTER — Encounter (HOSPITAL_COMMUNITY): Payer: PPO

## 2015-02-11 ENCOUNTER — Encounter (HOSPITAL_COMMUNITY): Payer: PPO

## 2015-02-13 ENCOUNTER — Encounter (HOSPITAL_COMMUNITY): Payer: PPO

## 2015-02-15 ENCOUNTER — Encounter (HOSPITAL_COMMUNITY): Payer: PPO

## 2015-02-18 ENCOUNTER — Encounter (HOSPITAL_COMMUNITY): Payer: PPO

## 2015-02-20 ENCOUNTER — Encounter (HOSPITAL_COMMUNITY): Payer: PPO

## 2015-02-22 ENCOUNTER — Encounter (HOSPITAL_COMMUNITY): Payer: PPO

## 2015-02-25 ENCOUNTER — Encounter (HOSPITAL_COMMUNITY): Payer: PPO

## 2015-02-27 ENCOUNTER — Encounter (HOSPITAL_COMMUNITY): Payer: PPO

## 2015-03-01 ENCOUNTER — Encounter (HOSPITAL_COMMUNITY): Payer: PPO

## 2015-03-04 ENCOUNTER — Encounter (HOSPITAL_COMMUNITY): Payer: PPO

## 2015-03-06 ENCOUNTER — Encounter (HOSPITAL_COMMUNITY): Payer: PPO

## 2015-03-08 ENCOUNTER — Encounter (HOSPITAL_COMMUNITY): Payer: PPO

## 2015-03-11 ENCOUNTER — Encounter (HOSPITAL_COMMUNITY): Payer: PPO

## 2015-03-13 ENCOUNTER — Encounter (HOSPITAL_COMMUNITY): Payer: PPO

## 2015-03-15 ENCOUNTER — Encounter (HOSPITAL_COMMUNITY): Payer: PPO

## 2015-03-18 ENCOUNTER — Encounter (HOSPITAL_COMMUNITY): Payer: PPO

## 2015-03-20 ENCOUNTER — Encounter (HOSPITAL_COMMUNITY): Payer: PPO

## 2015-03-22 ENCOUNTER — Encounter (HOSPITAL_COMMUNITY): Payer: PPO

## 2015-03-25 ENCOUNTER — Encounter (HOSPITAL_COMMUNITY): Payer: PPO

## 2015-03-27 ENCOUNTER — Encounter (HOSPITAL_COMMUNITY): Payer: PPO

## 2015-03-29 ENCOUNTER — Encounter (HOSPITAL_COMMUNITY): Payer: PPO

## 2015-04-01 ENCOUNTER — Encounter: Payer: Self-pay | Admitting: Endocrinology

## 2015-04-01 ENCOUNTER — Encounter (HOSPITAL_COMMUNITY): Payer: PPO

## 2015-04-01 ENCOUNTER — Ambulatory Visit
Admission: RE | Admit: 2015-04-01 | Discharge: 2015-04-01 | Disposition: A | Discharge status: Home / Self Care | Payer: PPO | Source: Ambulatory Visit | Attending: Endocrinology | Admitting: Endocrinology

## 2015-04-01 ENCOUNTER — Telehealth: Payer: Self-pay | Admitting: Endocrinology

## 2015-04-01 ENCOUNTER — Ambulatory Visit (INDEPENDENT_AMBULATORY_CARE_PROVIDER_SITE_OTHER): Payer: PPO | Admitting: Endocrinology

## 2015-04-01 VITALS — BP 132/70 | HR 84 | Temp 98.3°F | Ht 67.0 in | Wt 136.0 lb

## 2015-04-01 DIAGNOSIS — J189 Pneumonia, unspecified organism: Secondary | ICD-10-CM

## 2015-04-01 DIAGNOSIS — E1022 Type 1 diabetes mellitus with diabetic chronic kidney disease: Secondary | ICD-10-CM | POA: Diagnosis not present

## 2015-04-01 DIAGNOSIS — Z794 Long term (current) use of insulin: Secondary | ICD-10-CM

## 2015-04-01 DIAGNOSIS — E1122 Type 2 diabetes mellitus with diabetic chronic kidney disease: Secondary | ICD-10-CM | POA: Diagnosis not present

## 2015-04-01 DIAGNOSIS — R200 Anesthesia of skin: Secondary | ICD-10-CM | POA: Diagnosis not present

## 2015-04-01 DIAGNOSIS — N185 Chronic kidney disease, stage 5: Secondary | ICD-10-CM

## 2015-04-01 DIAGNOSIS — N189 Chronic kidney disease, unspecified: Secondary | ICD-10-CM | POA: Diagnosis not present

## 2015-04-01 LAB — TSH: TSH: 2.07 u[IU]/mL (ref 0.35–4.50)

## 2015-04-01 LAB — VITAMIN B12: Vitamin B-12: 549 pg/mL (ref 211–911)

## 2015-04-01 LAB — POCT GLYCOSYLATED HEMOGLOBIN (HGB A1C): Hemoglobin A1C: 11.6

## 2015-04-01 NOTE — Patient Instructions (Addendum)
The next step for your diabetes is to smooth out the blood sugar.  The best way to do this is to take all of the insulin in the morning.  A chest x-ray is requested for you today.  We'll let you know about the results.  please consider these measures for your health:  minimize alcohol, or avoid altogether.  do not use tobacco products.  have a colonoscopy at least every 10 years from age 60.  keep firearms safely stored.  always use seat belts.  have working smoke alarms in your home.  see an eye doctor and dentist regularly.  never drive under the influence of alcohol or drugs (including prescription drugs).   it is critically important to prevent falling down (keep floor areas well-lit, dry, and free of loose objects.  If you have a cane, walker, or wheelchair, you should use it, even for short trips around the house.  Also, try not to rush) Please come back for a regular physical appointment in 2 months.

## 2015-04-01 NOTE — Telephone Encounter (Signed)
Noted  

## 2015-04-01 NOTE — Progress Notes (Signed)
Subjective:    Patient ID: Steven Riddle, male    DOB: November 15, 1955, 60 y.o.   MRN: [id number]  HPI Pt returns for f/u of diabetes mellitus: DM type: Insulin-requiring type 2 Dx'ed: 1998 Complications: polyneuropathy of the lower extremities, ESRD (transplant 2013), CAD, and proliferative retinopathy. Therapy: insulin since 2005 DKA: never Severe hypoglycemia: never Pancreatitis: never.  Other: he had renal transplant in 2013 in [place]; therapy has been limited by pt's request for a simple regimen, and chronic noncompliance.   Interval history: He takes levemir, 20 each afternoon, and 30 units qhs.  no cbg record, but he has frequent mild hypoglycemia, approx once per month.  This happens in the fasting state.  He says cbg's are highest in the afternoon.   He was recently in the hospital with resp failure: he denies sob.  He has lost a few lbs since last ov.   Past Medical History  Diagnosis Date  . HYPERLIPIDEMIA 01/10/2007  . HYPERKALEMIA 04/09/2009  . PERIPHERAL NEUROPATHY 01/10/2007  . HYPERTENSION 01/10/2007  . 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 Palpitations 03/19/2008  . Proteinuria 06/07/2008  . Coronary artery disease   . Pneumonia 06/2014  . Type II diabetes mellitus (HCC) dx'd ~ 1998  . Anemia of chronic disease     /notes 09/06/2014  . RENAL INSUFFICIENCY, CHRONIC 06/07/2008  . Renal transplant recipient   . Non-Q wave myocardial infarction [facility])     "found evidence of one when hospitalized in 06/2014"    Past Surgical History  Procedure Laterality Date  . Nephrectomy transplanted organ  2013  . Cardiac catheterization N/A 06/04/2014    Procedure: Left Heart Cath and Coronary Angiography;  Surgeon: [name], MD;  Location: [facility] INVASIVE CV LAB [facility]UPID;  Service: Cardiovascular;  Laterality: N/A;  . Cardiac catheterization N/A 06/04/2014    Procedure: Coronary Stent Intervention;  Surgeon: [name], MD;  Location: [facility] INVAS[facility] CV LAB [facility]UP[facility];  Service: Cardiovascular;   Laterality: N/A;  . Cardiac catheterization N/A 06/07/2014    Procedure: Coronary Stent Intervention;  Surgeon: [name], MD;  Location: [facility] INVAS[facility] CV LAB [facility];  Service: Cardiovascular;  Laterality: N/A;  . Cataract extraction w/ intraocular lens implant Left ?06/2008  . Coronary angioplasty with stent placement  09/06/2014    "2 stents"  . Cardiac catheterization N/A 09/06/2014    Procedure: Left Heart Cath and Coronary Angiography;  Surgeon: [name], MD;  Location: [facility] INVASIVE CV LAB;  Service: Cardiovascular;  Laterality: N/A;  . Cardiac catheterization N/A 09/06/2014    Procedure: Coronary Stent Intervention;  Surgeon: [name], MD;  Location: [facility] INVASIVE CV LAB;  Service: Cardiovascular;  Laterality: N/A;    Social History   Social History  . Marital Status: Married    Spouse Name: N/A  . Number of Children: N/A  . Years of Education: N/A   Occupational History  . Owns [employer]    Social History Main Topics  . Smoking status: Never Smoker   . Smokeless tobacco: Never Used  . Alcohol Use: No  . Drug Use: No  . Sexual Activity: Yes   Other Topics Concern  . Not on file   Social History Narrative   Owns Hotels    Current Outpatient Prescriptions on File Prior to Visit  Medication Sig Dispense Refill  . aspirin 81 MG tablet Take 81 mg by mouth daily.      . BD ULTRA-FINE PEN NEEDLES 29G X 12.7MM MISC USE AS DIRECTED 100 each 5  . brimonidine (  ALPHAGAN) 0.2 % ophthalmic solution Apply 1 drop to eye 2 (two) times daily.    . carvedilol (COREG) 6.25 MG tablet Take 6.25 mg by mouth daily.    . cycloSPORINE (SANDIMMUNE) 100 MG capsule Take 100 mg by mouth 2 (two) times daily.     . dorzolamide-timolol (COSOPT) 22.3-6.8 MG/ML ophthalmic solution Apply 1 drop to eye at bedtime.    . fluconazole (DIFLUCAN) 150 MG tablet Take 150 mg by mouth daily.    . insulin detemir (LEVEMIR) 100 UNIT/ML injection Inject 50 Units into the skin every morning. And pen needles 1/day      . latanoprost (XALATAN) 0.005 % ophthalmic solution Apply 1 drop to eye at bedtime.    . mycophenolate (MYFORTIC) 180 MG EC tablet Take 180 mg by mouth daily.     . prasugrel (EFFIENT) 10 MG TABS tablet Take 1 tablet (10 mg total) by mouth daily. 30 tablet 0  . predniSONE (DELTASONE) 5 MG tablet Take 5 mg by mouth daily.    . rosuvastatin (CRESTOR) 5 MG tablet Take 1 tablet (5 mg total) by mouth daily at 6 PM. 30 tablet 0   Current Facility-Administered Medications on File Prior to Visit  Medication Dose Route Frequency Provider Last Rate Last Dose  . magnesium oxide (MAG-OX) tablet 400 mg  400 mg Oral Once Drema Dallas, MD   400 mg at 06/08/14 1800    No Known Allergies  Family History  Problem Relation Age of Onset  . Cancer Neg Hx   . Diabetes Mother   . Hypertension Mother   . Stroke Mother   . Diabetes Father   . Diabetes Sister   . Diabetes Brother   . Hypertension Brother   . Hypertension Sister     BP 132/70 mmHg  Pulse 84  Temp(Src) 98.3 F (36.8 C) (Oral)  Ht 5\' 7"  (1.702 m)  Wt 136 lb (61.689 kg)  BMI 21.30 kg/m2  SpO2 98%    Review of Systems Denies LOC and fever    Objective:   Physical Exam VITAL SIGNS:  See vs page GENERAL: no distress Pulses: dorsalis pedis intact bilat.   MSK: no deformity of the feet CV: no leg edema Skin:  no ulcer on the feet.  normal color and temp on the feet. Neuro: sensation is intact to touch on the feet, but decreased from normal    A1c=11.6%    Assessment & Plan:  DM: worse: The next step for your diabetes is to smooth out the blood sugar.  The best way to do this is to take all of the insulin in the morning.  resp failure, clinically better: a chest x-ray is requested for you today.  We'll let you know about the results.    Subjective:   Patient here for Medicare annual wellness visit and management of other chronic and acute problems.     Risk factors: advanced age    Roster of Physicians Providing  Medical Care to Patient:  See "snapshot"   Activities of Daily Living: In your present state of health, do you have any difficulty performing the following activities?:  Preparing food and eating?: No  Bathing yourself: No  Getting dressed: No  Using the toilet:No  Moving around from place to place: No  In the past year have you fallen or had a near fall?:No    Home Safety: Has smoke detector and wears seat belts. No firearms.   Diet and Exercise  Current exercise habits:  Dietary issues discussed: healthy diet   Depression Screen  Q1: Over the past two weeks, have you felt down, depressed or hopeless? no  Q2: Over the past two weeks, have you felt little interest or pleasure in doing things? no   The following portions of the patient's history were reviewed and updated as appropriate: allergies, current medications, past family history, past medical history, past social history, past surgical history and problem list.    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  Msk: pt slowly performs "get-up-and-go" from a sitting position, with the aid of a cane Cognitive Impairment Assessment: cognition, memory and judgment appear normal.  remembers 3/3 at 5 minutes.  excellent recall.  can easily read and write a sentence.  alert and oriented x 3.    Assessment:   Medicare wellness utd on preventive parameters    Plan:   During the course of the visit the patient was educated and counseled about appropriate screening and preventive services including:        Fall prevention   Diabetes screening  Nutrition counseling   Vaccines / LABS Zostavax / Pneumococcal Vaccine  today  PSA  Patient Instructions (the written plan) was given to the patient.

## 2015-04-01 NOTE — Telephone Encounter (Signed)
Please mail the lab results to the pts home once they are resulted

## 2015-04-01 NOTE — Progress Notes (Signed)
we discussed code status.  pt requests full code, but would not want to be started or maintained on artificial life-support measures if there was not a reasonable chance of recovery 

## 2015-04-03 ENCOUNTER — Encounter (HOSPITAL_COMMUNITY): Payer: PPO

## 2015-04-03 DIAGNOSIS — E119 Type 2 diabetes mellitus without complications: Secondary | ICD-10-CM | POA: Insufficient documentation

## 2015-04-05 ENCOUNTER — Encounter (HOSPITAL_COMMUNITY): Payer: PPO

## 2015-04-08 ENCOUNTER — Encounter (HOSPITAL_COMMUNITY): Payer: PPO

## 2015-04-10 ENCOUNTER — Encounter (HOSPITAL_COMMUNITY): Payer: PPO

## 2015-04-12 ENCOUNTER — Encounter (HOSPITAL_COMMUNITY): Payer: PPO

## 2015-04-15 ENCOUNTER — Encounter (HOSPITAL_COMMUNITY): Payer: PPO

## 2015-04-17 ENCOUNTER — Encounter (HOSPITAL_COMMUNITY): Payer: PPO

## 2015-04-19 ENCOUNTER — Encounter (HOSPITAL_COMMUNITY): Payer: PPO

## 2015-06-05 ENCOUNTER — Encounter (HOSPITAL_COMMUNITY): Payer: Self-pay | Admitting: Cardiac Rehabilitation

## 2015-06-05 NOTE — Progress Notes (Signed)
Pt discharged from cardiac rehab program due to non attendance.  

## 2015-10-18 ENCOUNTER — Encounter (HOSPITAL_COMMUNITY): Payer: Self-pay | Admitting: Emergency Medicine

## 2015-10-18 ENCOUNTER — Emergency Department (HOSPITAL_COMMUNITY): Payer: PPO

## 2015-10-18 ENCOUNTER — Inpatient Hospital Stay (HOSPITAL_COMMUNITY)
Admission: EM | Admit: 2015-10-18 | Discharge: 2015-11-02 | DRG: 233 | Disposition: A | Discharge status: Home / Self Care | Payer: PPO | Attending: Surgery | Admitting: Surgery

## 2015-10-18 DIAGNOSIS — I214 Non-ST elevation (NSTEMI) myocardial infarction: Principal | ICD-10-CM

## 2015-10-18 DIAGNOSIS — E1151 Type 2 diabetes mellitus with diabetic peripheral angiopathy without gangrene: Secondary | ICD-10-CM | POA: Diagnosis present

## 2015-10-18 DIAGNOSIS — T82855A Stenosis of coronary artery stent, initial encounter: Secondary | ICD-10-CM | POA: Diagnosis present

## 2015-10-18 DIAGNOSIS — I1310 Hypertensive heart and chronic kidney disease without heart failure, with stage 1 through stage 4 chronic kidney disease, or unspecified chronic kidney disease: Secondary | ICD-10-CM | POA: Diagnosis present

## 2015-10-18 DIAGNOSIS — D638 Anemia in other chronic diseases classified elsewhere: Secondary | ICD-10-CM | POA: Diagnosis present

## 2015-10-18 DIAGNOSIS — Z79899 Other long term (current) drug therapy: Secondary | ICD-10-CM

## 2015-10-18 DIAGNOSIS — Z01818 Encounter for other preprocedural examination: Secondary | ICD-10-CM

## 2015-10-18 DIAGNOSIS — N182 Chronic kidney disease, stage 2 (mild): Secondary | ICD-10-CM | POA: Diagnosis present

## 2015-10-18 DIAGNOSIS — I5023 Acute on chronic systolic (congestive) heart failure: Secondary | ICD-10-CM | POA: Diagnosis present

## 2015-10-18 DIAGNOSIS — N2581 Secondary hyperparathyroidism of renal origin: Secondary | ICD-10-CM | POA: Diagnosis present

## 2015-10-18 DIAGNOSIS — Z951 Presence of aortocoronary bypass graft: Secondary | ICD-10-CM

## 2015-10-18 DIAGNOSIS — N289 Disorder of kidney and ureter, unspecified: Secondary | ICD-10-CM

## 2015-10-18 DIAGNOSIS — I350 Nonrheumatic aortic (valve) stenosis: Secondary | ICD-10-CM | POA: Diagnosis present

## 2015-10-18 DIAGNOSIS — I272 Other secondary pulmonary hypertension: Secondary | ICD-10-CM | POA: Diagnosis present

## 2015-10-18 DIAGNOSIS — E1122 Type 2 diabetes mellitus with diabetic chronic kidney disease: Secondary | ICD-10-CM | POA: Diagnosis present

## 2015-10-18 DIAGNOSIS — N179 Acute kidney failure, unspecified: Secondary | ICD-10-CM | POA: Diagnosis not present

## 2015-10-18 DIAGNOSIS — E1142 Type 2 diabetes mellitus with diabetic polyneuropathy: Secondary | ICD-10-CM | POA: Diagnosis present

## 2015-10-18 DIAGNOSIS — Z794 Long term (current) use of insulin: Secondary | ICD-10-CM

## 2015-10-18 DIAGNOSIS — E11649 Type 2 diabetes mellitus with hypoglycemia without coma: Secondary | ICD-10-CM | POA: Diagnosis not present

## 2015-10-18 DIAGNOSIS — I255 Ischemic cardiomyopathy: Secondary | ICD-10-CM | POA: Diagnosis present

## 2015-10-18 DIAGNOSIS — I252 Old myocardial infarction: Secondary | ICD-10-CM

## 2015-10-18 DIAGNOSIS — M6281 Muscle weakness (generalized): Secondary | ICD-10-CM

## 2015-10-18 DIAGNOSIS — E872 Acidosis: Secondary | ICD-10-CM | POA: Diagnosis not present

## 2015-10-18 DIAGNOSIS — Z940 Kidney transplant status: Secondary | ICD-10-CM

## 2015-10-18 DIAGNOSIS — Z7982 Long term (current) use of aspirin: Secondary | ICD-10-CM

## 2015-10-18 DIAGNOSIS — I2510 Atherosclerotic heart disease of native coronary artery without angina pectoris: Secondary | ICD-10-CM

## 2015-10-18 DIAGNOSIS — Z7952 Long term (current) use of systemic steroids: Secondary | ICD-10-CM

## 2015-10-18 DIAGNOSIS — R0602 Shortness of breath: Secondary | ICD-10-CM | POA: Diagnosis not present

## 2015-10-18 DIAGNOSIS — Y848 Other medical procedures as the cause of abnormal reaction of the patient, or of later complication, without mention of misadventure at the time of the procedure: Secondary | ICD-10-CM | POA: Diagnosis present

## 2015-10-18 DIAGNOSIS — I5021 Acute systolic (congestive) heart failure: Secondary | ICD-10-CM | POA: Diagnosis present

## 2015-10-18 DIAGNOSIS — I509 Heart failure, unspecified: Secondary | ICD-10-CM

## 2015-10-18 DIAGNOSIS — E785 Hyperlipidemia, unspecified: Secondary | ICD-10-CM | POA: Diagnosis present

## 2015-10-18 DIAGNOSIS — F329 Major depressive disorder, single episode, unspecified: Secondary | ICD-10-CM | POA: Diagnosis present

## 2015-10-18 DIAGNOSIS — J9811 Atelectasis: Secondary | ICD-10-CM | POA: Diagnosis not present

## 2015-10-18 DIAGNOSIS — I959 Hypotension, unspecified: Secondary | ICD-10-CM | POA: Diagnosis not present

## 2015-10-18 NOTE — ED Triage Notes (Signed)
Pt c/o cough x 1 week, increased SHOB x 3 days with LE edema. +orthopnea. Pt is kidney transplant patient, pt has never experienced edema in the past.

## 2015-10-18 NOTE — ED Notes (Signed)
Bed: [facility]1 Expected date:  Expected time:  Means of arrival:  Comments: 

## 2015-10-18 NOTE — ED Provider Notes (Signed)
[facility]-EMERGENCY DEPT Provider Note   CSN: [id number] Arrival date & time: 10/18/15  2229  By signing my name below, I, Nelwyn Salisbury, attest that this documentation has been prepared under the direction and in the presence of Dione Booze, MD . Electronically Signed: Nelwyn Salisbury, Scribe. 10/18/2015. 11:49 PM.  History   Chief Complaint Chief Complaint  Patient presents with  . Cough   The history is provided by the patient and the spouse. No language interpreter was used.    HPI Comments:  Rodert Hinch is a 60 y.o. male who presents to the Emergency Department complaining of sudden-onset unchanged non-productive cough beginning one week ago. Pt reports he was recently seen at urgent care and was told to come to the ED after an ultrasound of his chest revealed excess fluid. Pt's wife states that he has been taking cough medication for his symptoms with minimal relief. Pt endorses associated lower extremity swelling and shortness of breath that has worsened over the past three days. He denies any fever, chills, diaphoresis or chest pain. Pt reports the last time his creatinine levels were tested, they were below 2.0.   Past Medical History:  Diagnosis Date  . Anemia of chronic disease    /notes 09/06/2014  . Coronary artery disease   . HYPERKALEMIA 04/09/2009  . HYPERLIPIDEMIA 01/10/2007  . HYPERTENSION 01/10/2007  . Non-Q wave myocardial infarction [facility])    "found evidence of one when hospitalized in 06/2014"  . Palpitations 03/19/2008  . PERIPHERAL NEUROPATHY 01/10/2007  . Pneumonia 06/2014  . Proteinuria 06/07/2008  . RENAL INSUFFICIENCY, CHRONIC 06/07/2008  . Renal transplant recipient   . 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 Type II diabetes mellitus (HCC) dx'd ~ 1998    Patient Active Problem List   Diagnosis Date Noted  . Diabetes (HCC) 04/03/2015  . Numbness 04/01/2015  . Acute non-Q-wave MI <8 weeks prior, subsequent admission after initial 09/06/2014  . Acute respiratory  failure with hypoxia (HCC)   . Chronic systolic heart failure (HCC)   . Renal transplant, status post   . Hypomagnesemia   . Non Q wave myocardial infarction (HCC)   . Chronic systolic congestive heart failure (HCC)   . History of renal transplant   . Acute non Q wave MI (myocardial infarction), initial episode of care (HCC)   . Acute on chronic respiratory failure with hypoxia (HCC)   . Renal transplant recipient   . Immunosuppressed status (HCC)   . Hypokalemia   . Essential hypertension   . Elevated troponin   . Protein-calorie malnutrition, severe (HCC) 06/02/2014  . Acute respiratory failure with hypoxemia (HCC) 05/30/2014  . Acute pulmonary edema (HCC) 05/30/2014  . Sepsis due to pneumonia (HCC) 05/29/2014  . CAP (community acquired pneumonia) 05/29/2014  . CHF (congestive heart failure) (HCC) 05/29/2014  . Hypoxia 05/29/2014  . NSTEMI (non-ST elevated myocardial infarction) (HCC) 05/29/2014  . PAD (peripheral artery disease) (HCC) 11/20/2013  . Routine general medical examination at a health care facility 04/06/2013  . Screening for prostate cancer 01/06/2012  . Cardiomyopathy 06/05/2010  . BPH (benign prostatic hyperplasia) 06/05/2010  . Fatty liver 06/05/2010  . Gallbladder sludge 06/05/2010  . HYPERKALEMIA 04/09/2009  . 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 RENAL INSUFFICIENCY, CHRONIC 06/07/2008  . PROTEINURIA 06/07/2008  . PALPITATIONS 03/19/2008  . LEG PAIN, RIGHT 11/17/2007  . ANXIETY 05/23/2007  . ANEMIA 02/15/2007  . HYPERLIPIDEMIA 01/10/2007  . DEPRESSION 01/10/2007  . PERIPHERAL NEUROPATHY 01/10/2007  . HYPERTENSION 01/10/2007  . ALLERGIC RHINITIS 01/10/2007  Past Surgical History:  Procedure Laterality Date  . CARDIAC CATHETERIZATION N/A 06/04/2014   Procedure: Left Heart Cath and Coronary Angiography;  Surgeon: [name], MD;  Location: [facility];  Service: Cardiovascular;  Laterality: N/A;  . CARDIAC CATHETERIZATION N/A  06/04/2014   Procedure: Coronary Stent Intervention;  Surgeon: [name], MD;  Location: [facility];  Service: Cardiovascular;  Laterality: N/A;  . CARDIAC CATHETERIZATION N/A 06/07/2014   Procedure: Coronary Stent Intervention;  Surgeon: [name], MD;  Location: [facility];  Service: Cardiovascular;  Laterality: N/A;  . CARDIAC CATHETERIZATION N/A 09/06/2014   Procedure: Left Heart Cath and Coronary Angiography;  Surgeon: [name], MD;  Location: [facility];  Service: Cardiovascular;  Laterality: N/A;  . CARDIAC CATHETERIZATION N/A 09/06/2014   Procedure: Coronary Stent Intervention;  Surgeon: [name], MD;  Location: [facility];  Service: Cardiovascular;  Laterality: N/A;  . CATARACT EXTRACTION W/ INTRAOCULAR LENS IMPLANT Left ?06/2008  . CORONARY ANGIOPLASTY WITH STENT PLACEMENT  09/06/2014   "2 stents"  . NEPHRECTOMY TRANSPLANTED ORGAN  2013       Home Medications    Prior to Admission medications   Medication Sig Start Date End Date Taking? Authorizing Provider  aspirin 81 MG tablet Take 81 mg by mouth daily.      Historical Provider, MD  BD ULTRA-FINE PEN NEEDLES 29G X 12.7MM MISC USE AS DIRECTED 02/28/11   Romero Belling, MD  carvedilol (COREG) 6.25 MG tablet Take 6.25 mg by mouth daily.    Historical Provider, MD  cycloSPORINE (SANDIMMUNE) 100 MG capsule Take 100 mg by mouth 2 (two) times daily.     Historical Provider, MD  dorzolamide-timolol (COSOPT) 22.3-6.8 MG/ML ophthalmic solution Apply 1 drop to eye at bedtime. 05/10/14   Historical Provider, MD  fluconazole (DIFLUCAN) 150 MG tablet Take 150 mg by mouth daily.    Historical Provider, MD  insulin detemir (LEVEMIR) 100 UNIT/ML injection Inject 50 Units into the skin every morning. And pen needles 1/day    Historical Provider, MD  mycophenolate (MYFORTIC) 180 MG EC tablet Take 180 mg by mouth daily.     Historical Provider, MD  prasugrel (EFFIENT) 10 MG TABS tablet Take 1 tablet  (10 mg total) by mouth daily. 06/08/14   Drema Dallas, MD  predniSONE (DELTASONE) 5 MG tablet Take 5 mg by mouth daily.    Historical Provider, MD  rosuvastatin (CRESTOR) 5 MG tablet Take 1 tablet (5 mg total) by mouth daily at 6 PM. 06/08/14   Drema Dallas, MD    Family History Family History  Problem Relation Age of Onset  . Diabetes Mother   . Hypertension Mother   . Stroke Mother   . Diabetes Father   . Diabetes Sister   . Diabetes Brother   . Hypertension Brother   . Hypertension Sister   . Cancer Neg Hx     Social History Social History  Substance Use Topics  . Smoking status: Never Smoker  . Smokeless tobacco: Never Used  . Alcohol use No     Allergies   Review of patient's allergies indicates no known allergies.   Review of Systems Review of Systems  Constitutional: Negative for chills, diaphoresis and fever.  Respiratory: Positive for cough and shortness of breath.   Cardiovascular: Positive for leg swelling. Negative for chest pain.  All other systems reviewed and are negative.    Physical Exam Updated Vital Signs BP  136/72   Pulse 74   Temp 98.1 F (36.7 C) (Oral)   Resp 20   SpO2 95%   Physical Exam  Constitutional: He appears well-developed and well-nourished.  HENT:  Head: Normocephalic and atraumatic.  Eyes: Conjunctivae are normal.  Neck: Neck supple.  Cardiovascular: Normal rate and regular rhythm.   No murmur heard. Pulmonary/Chest: Effort normal and breath sounds normal. No respiratory distress.  Rales present in right base.   Abdominal: Soft. There is no tenderness.  Musculoskeletal: He exhibits no edema.  2+ pedal edema.   Neurological: He is alert.  Skin: Skin is warm and dry.  Psychiatric: He has a normal mood and affect.  Nursing note and vitals reviewed.    ED Treatments / Results  DIAGNOSTIC STUDIES:  Oxygen Saturation is 96% on RA, normal by my interpretation.    COORDINATION OF CARE:  12:07 AM Discussed treatment  plan with pt at bedside which included bloodwork and pt agreed to plan.  12:46AM Discussed results of blood test with patient.   Labs (all labs ordered are listed, but only abnormal results are displayed) Labs Reviewed  BASIC METABOLIC PANEL - Abnormal; Notable for the following:       Result Value   Potassium 5.3 (*)    Glucose, Bld 171 (*)    BUN 44 (*)    Creatinine, Ser 1.45 (*)    GFR calc non Af Amer 51 (*)    GFR calc Af Amer 59 (*)    All other components within normal limits  CBC - Abnormal; Notable for the following:    RDW 16.4 (*)    All other components within normal limits  BRAIN NATRIURETIC PEPTIDE - Abnormal; Notable for the following:    B Natriuretic Peptide 1,655.7 (*)    All other components within normal limits  APTT - Abnormal; Notable for the following:    aPTT 50 (*)    All other components within normal limits  I-STAT TROPOININ, ED - Abnormal; Notable for the following:    Troponin i, poc 0.19 (*)    All other components within normal limits  DIFFERENTIAL  PROTIME-INR  URINALYSIS, ROUTINE W REFLEX MICROSCOPIC (NOT AT [facility])  HEPARIN LEVEL (UNFRACTIONATED)  CBC  I-STAT TROPOININ, ED    EKG  EKG Interpretation  Date/Time:  Friday October 18 2015 23:22:05 EDT Ventricular Rate:  73 PR Interval:    QRS Duration: 154 QT Interval:  505 QTC Calculation: 557 R Axis:   -79 Text Interpretation:  Sinus rhythm Right bundle branch block Anterior infarct, age indeterminate Lateral leads are also involved When compared with ECG of 09/07/2014, No significant change was found Confirmed by [place]  MD, Waymon Laser ([id number]) on 10/18/2015 11:55:58 PM       Radiology Dg Chest 2 View  Result Date: 10/19/2015 CLINICAL DATA:  Cough for 1 week. Increased shortness of breath for 3 days. Lower extremity edema. EXAM: CHEST  2 VIEW COMPARISON:  04/01/2015 FINDINGS: Low lung volumes. Heart appears enlarged, likely accentuated by technique. There is atherosclerosis of the thoracic  aorta. Ill-defined right supra and infrahilar opacities. Small right pleural effusion. Central vascular congestion with question of minimal edema. No pneumothorax. No acute osseous abnormality. IMPRESSION: 1. Mild vascular congestion, question of minimal pulmonary edema. Suspect mild congestive failure. 2. Right supra and infrahilar opacities, suspicious for pneumonia, less likely asymmetric edema. 3. Small right pleural effusion. 4. Recommend radiographic follow-up to resolution. Electronically Signed   By: Rubye Oaks M.D.   On:  10/19/2015 00:01   Procedures Procedures (including critical care time) CRITICAL CARE Performed by: QMVHQ,IONGEGLICK,Mathis Cashman Total critical care time: 50 minutes Critical care time was exclusive of separately billable procedures and treating other patients. Critical care was necessary to treat or prevent imminent or life-threatening deterioration. Critical care was time spent personally by me on the following activities: development of treatment plan with patient and/or surrogate as well as nursing, discussions with consultants, evaluation of patient's response to treatment, examination of patient, obtaining history from patient or surrogate, ordering and performing treatments and interventions, ordering and review of laboratory studies, ordering and review of radiographic studies, pulse oximetry and re-evaluation of patient's condition.  Medications Ordered in ED Medications  heparin ADULT infusion 100 units/mL (25000 units/27450mL sodium chloride 0.45%) (850 Units/hr Intravenous New Bag/Given 10/19/15 0103)  furosemide (LASIX) injection 40 mg (not administered)  aspirin chewable tablet 324 mg (324 mg Oral Given 10/19/15 0055)  heparin bolus via infusion 3,000 Units (3,000 Units Intravenous Bolus from Bag 10/19/15 0108)     Initial Impression / Assessment and Plan / ED Course  I have reviewed the triage vital signs and the nursing notes.  Pertinent labs & imaging results that  were available during my care of the patient were reviewed by me and considered in my medical decision making (see chart for details).  Clinical Course   Peripheral edema concerning for possible rejection of heart transplant. Old records are reviewed showing he had a non-STEMI one year ago with placement of numerous stents. Screening labs are obtained including troponin and troponin has come back elevated indicating he is had another non-STEMI. Per old records, he had had silent heart attacks in the past as well. ECG showed no acute changes. Chest x-ray is consistent with mild pulmonary edema. Creatinine has increased slightly compared with one year ago to 1.45. However, patient states that his creatinine is somewhere between 1 and 2-he is not sure exactly what it had been recently. He is started on heparin and is given aspirin. Is given furosemide for his edema. Case is discussed with Dr. Sharyn LullHarwani, his cardiologist, who agrees to admit the patient. [facility] at the heart center.  Final Clinical Impressions(s) / ED Diagnoses   Final diagnoses:  NSTEMI, initial episode of care [facility](HCC)  Acute on chronic congestive heart failure, unspecified congestive heart failure type [facility](HCC)  Renal insufficiency    New Prescriptions New Prescriptions   No medications on file    I personally performed the services described in this documentation, which was scribed in my presence. The recorded information has been reviewed and is accurate.      Dione Boozeavid Makaveli Hoard, MD 10/19/15 [phone]

## 2015-10-19 DIAGNOSIS — N2581 Secondary hyperparathyroidism of renal origin: Secondary | ICD-10-CM | POA: Diagnosis not present

## 2015-10-19 DIAGNOSIS — I5021 Acute systolic (congestive) heart failure: Secondary | ICD-10-CM | POA: Diagnosis present

## 2015-10-19 DIAGNOSIS — N179 Acute kidney failure, unspecified: Secondary | ICD-10-CM | POA: Diagnosis not present

## 2015-10-19 DIAGNOSIS — E1151 Type 2 diabetes mellitus with diabetic peripheral angiopathy without gangrene: Secondary | ICD-10-CM | POA: Diagnosis present

## 2015-10-19 DIAGNOSIS — F329 Major depressive disorder, single episode, unspecified: Secondary | ICD-10-CM | POA: Diagnosis present

## 2015-10-19 DIAGNOSIS — T82855A Stenosis of coronary artery stent, initial encounter: Secondary | ICD-10-CM | POA: Diagnosis not present

## 2015-10-19 DIAGNOSIS — N182 Chronic kidney disease, stage 2 (mild): Secondary | ICD-10-CM | POA: Diagnosis present

## 2015-10-19 DIAGNOSIS — I255 Ischemic cardiomyopathy: Secondary | ICD-10-CM | POA: Diagnosis present

## 2015-10-19 DIAGNOSIS — I5023 Acute on chronic systolic (congestive) heart failure: Secondary | ICD-10-CM | POA: Diagnosis not present

## 2015-10-19 DIAGNOSIS — Z7952 Long term (current) use of systemic steroids: Secondary | ICD-10-CM | POA: Diagnosis not present

## 2015-10-19 DIAGNOSIS — I2510 Atherosclerotic heart disease of native coronary artery without angina pectoris: Secondary | ICD-10-CM | POA: Diagnosis present

## 2015-10-19 DIAGNOSIS — E1142 Type 2 diabetes mellitus with diabetic polyneuropathy: Secondary | ICD-10-CM | POA: Diagnosis not present

## 2015-10-19 DIAGNOSIS — I959 Hypotension, unspecified: Secondary | ICD-10-CM | POA: Diagnosis not present

## 2015-10-19 DIAGNOSIS — Z79899 Other long term (current) drug therapy: Secondary | ICD-10-CM | POA: Diagnosis not present

## 2015-10-19 DIAGNOSIS — J9811 Atelectasis: Secondary | ICD-10-CM | POA: Diagnosis not present

## 2015-10-19 DIAGNOSIS — E1122 Type 2 diabetes mellitus with diabetic chronic kidney disease: Secondary | ICD-10-CM | POA: Diagnosis not present

## 2015-10-19 DIAGNOSIS — Z7982 Long term (current) use of aspirin: Secondary | ICD-10-CM | POA: Diagnosis not present

## 2015-10-19 DIAGNOSIS — Z01810 Encounter for preprocedural cardiovascular examination: Secondary | ICD-10-CM | POA: Diagnosis not present

## 2015-10-19 DIAGNOSIS — Z794 Long term (current) use of insulin: Secondary | ICD-10-CM | POA: Diagnosis not present

## 2015-10-19 DIAGNOSIS — E872 Acidosis: Secondary | ICD-10-CM | POA: Diagnosis not present

## 2015-10-19 DIAGNOSIS — I25110 Atherosclerotic heart disease of native coronary artery with unstable angina pectoris: Secondary | ICD-10-CM | POA: Diagnosis not present

## 2015-10-19 DIAGNOSIS — Z940 Kidney transplant status: Secondary | ICD-10-CM | POA: Diagnosis not present

## 2015-10-19 DIAGNOSIS — E785 Hyperlipidemia, unspecified: Secondary | ICD-10-CM | POA: Diagnosis present

## 2015-10-19 DIAGNOSIS — I214 Non-ST elevation (NSTEMI) myocardial infarction: Secondary | ICD-10-CM | POA: Diagnosis not present

## 2015-10-19 DIAGNOSIS — Y848 Other medical procedures as the cause of abnormal reaction of the patient, or of later complication, without mention of misadventure at the time of the procedure: Secondary | ICD-10-CM | POA: Diagnosis present

## 2015-10-19 DIAGNOSIS — D638 Anemia in other chronic diseases classified elsewhere: Secondary | ICD-10-CM | POA: Diagnosis present

## 2015-10-19 DIAGNOSIS — I1310 Hypertensive heart and chronic kidney disease without heart failure, with stage 1 through stage 4 chronic kidney disease, or unspecified chronic kidney disease: Secondary | ICD-10-CM | POA: Diagnosis not present

## 2015-10-19 DIAGNOSIS — I252 Old myocardial infarction: Secondary | ICD-10-CM | POA: Diagnosis not present

## 2015-10-19 DIAGNOSIS — R0602 Shortness of breath: Secondary | ICD-10-CM | POA: Diagnosis present

## 2015-10-19 LAB — CBC WITH DIFFERENTIAL/PLATELET
BASOS ABS: 0.1 10*3/uL (ref 0.0–0.1)
Basophils Relative: 1 %
EOS PCT: 4 %
Eosinophils Absolute: 0.4 10*3/uL (ref 0.0–0.7)
HEMATOCRIT: 42.3 % (ref 39.0–52.0)
Hemoglobin: 13.5 g/dL (ref 13.0–17.0)
LYMPHS PCT: 23 %
Lymphs Abs: 2.3 10*3/uL (ref 0.7–4.0)
MCH: 27.3 pg (ref 26.0–34.0)
MCHC: 31.9 g/dL (ref 30.0–36.0)
MCV: 85.6 fL (ref 78.0–100.0)
MONOS PCT: 8 %
Monocytes Absolute: 0.8 10*3/uL (ref 0.1–1.0)
NEUTROS PCT: 64 %
Neutro Abs: 6.5 10*3/uL (ref 1.7–7.7)
Platelets: 303 10*3/uL (ref 150–400)
RBC: 4.94 MIL/uL (ref 4.22–5.81)
RDW: 16.5 % — ABNORMAL HIGH (ref 11.5–15.5)
WBC: 10.1 10*3/uL (ref 4.0–10.5)

## 2015-10-19 LAB — MAGNESIUM: Magnesium: 1.8 mg/dL (ref 1.7–2.4)

## 2015-10-19 LAB — DIFFERENTIAL
BASOS ABS: 0 10*3/uL (ref 0.0–0.1)
Basophils Relative: 0 %
EOS ABS: 0.1 10*3/uL (ref 0.0–0.7)
Eosinophils Relative: 1 %
LYMPHS ABS: 1.8 10*3/uL (ref 0.7–4.0)
Lymphocytes Relative: 20 %
MONO ABS: 0.6 10*3/uL (ref 0.1–1.0)
Monocytes Relative: 7 %
NEUTROS PCT: 72 %
Neutro Abs: 6.6 10*3/uL (ref 1.7–7.7)

## 2015-10-19 LAB — URINALYSIS, ROUTINE W REFLEX MICROSCOPIC
BILIRUBIN URINE: NEGATIVE
Glucose, UA: 250 mg/dL — AB
Hgb urine dipstick: NEGATIVE
KETONES UR: NEGATIVE mg/dL
LEUKOCYTES UA: NEGATIVE
NITRITE: NEGATIVE
PROTEIN: 100 mg/dL — AB
Specific Gravity, Urine: 1.022 (ref 1.005–1.030)
pH: 6 (ref 5.0–8.0)

## 2015-10-19 LAB — COMPREHENSIVE METABOLIC PANEL
ALBUMIN: 2.7 g/dL — AB (ref 3.5–5.0)
ALK PHOS: 103 U/L (ref 38–126)
ALT: 27 U/L (ref 17–63)
AST: 27 U/L (ref 15–41)
Anion gap: 11 (ref 5–15)
BUN: 39 mg/dL — AB (ref 6–20)
CALCIUM: 9.2 mg/dL (ref 8.9–10.3)
CO2: 22 mmol/L (ref 22–32)
CREATININE: 1.39 mg/dL — AB (ref 0.61–1.24)
Chloride: 109 mmol/L (ref 101–111)
GFR calc non Af Amer: 53 mL/min — ABNORMAL LOW (ref >60)
GLUCOSE: 87 mg/dL (ref 65–99)
Potassium: 4.4 mmol/L (ref 3.5–5.1)
SODIUM: 142 mmol/L (ref 135–145)
Total Bilirubin: 0.9 mg/dL (ref 0.3–1.2)
Total Protein: 7.1 g/dL (ref 6.5–8.1)

## 2015-10-19 LAB — TSH: TSH: 1.597 u[IU]/mL (ref 0.350–4.500)

## 2015-10-19 LAB — TROPONIN I
Troponin I: 0.3 ng/mL (ref <0.03)
Troponin I: 0.23 ng/mL (ref <0.03)
Troponin I: 0.23 ng/mL (ref <0.03)

## 2015-10-19 LAB — GLUCOSE, CAPILLARY
GLUCOSE-CAPILLARY: 77 mg/dL (ref 65–99)
GLUCOSE-CAPILLARY: 104 mg/dL — AB (ref 65–99)
Glucose-Capillary: 120 mg/dL — ABNORMAL HIGH (ref 65–99)
Glucose-Capillary: 224 mg/dL — ABNORMAL HIGH (ref 65–99)

## 2015-10-19 LAB — CBC
HCT: 40.4 % (ref 39.0–52.0)
HCT: 41 % (ref 39.0–52.0)
HEMOGLOBIN: 12.9 g/dL — AB (ref 13.0–17.0)
Hemoglobin: 13.7 g/dL (ref 13.0–17.0)
MCH: 27.6 pg (ref 26.0–34.0)
MCH: 28.2 pg (ref 26.0–34.0)
MCHC: 31.9 g/dL (ref 30.0–36.0)
MCHC: 33.4 g/dL (ref 30.0–36.0)
MCV: 86.3 fL (ref 78.0–100.0)
MCV: 84.4 fL (ref 78.0–100.0)
PLATELETS: 306 10*3/uL (ref 150–400)
PLATELETS: 324 10*3/uL (ref 150–400)
RBC: 4.68 MIL/uL (ref 4.22–5.81)
RBC: 4.86 MIL/uL (ref 4.22–5.81)
RDW: 16.4 % — AB (ref 11.5–15.5)
RDW: 16.6 % — AB (ref 11.5–15.5)
WBC: 8.8 10*3/uL (ref 4.0–10.5)
WBC: 9.1 10*3/uL (ref 4.0–10.5)

## 2015-10-19 LAB — URINE MICROSCOPIC-ADD ON: RBC / HPF: NONE SEEN RBC/hpf (ref 0–5)

## 2015-10-19 LAB — BASIC METABOLIC PANEL
ANION GAP: 7 (ref 5–15)
BUN: 44 mg/dL — ABNORMAL HIGH (ref 6–20)
CO2: 24 mmol/L (ref 22–32)
Calcium: 9.1 mg/dL (ref 8.9–10.3)
Chloride: 109 mmol/L (ref 101–111)
Creatinine, Ser: 1.45 mg/dL — ABNORMAL HIGH (ref 0.61–1.24)
GFR, EST AFRICAN AMERICAN: 59 mL/min — AB (ref >60)
GFR, EST NON AFRICAN AMERICAN: 51 mL/min — AB (ref >60)
Glucose, Bld: 171 mg/dL — ABNORMAL HIGH (ref 65–99)
Potassium: 5.3 mmol/L — ABNORMAL HIGH (ref 3.5–5.1)
Sodium: 140 mmol/L (ref 135–145)

## 2015-10-19 LAB — I-STAT TROPONIN, ED
TROPONIN I, POC: 0.16 ng/mL — AB (ref 0.00–0.08)
Troponin i, poc: 0.19 ng/mL (ref 0.00–0.08)

## 2015-10-19 LAB — HEPARIN LEVEL (UNFRACTIONATED): Heparin Unfractionated: 0.78 IU/mL — ABNORMAL HIGH (ref 0.30–0.70)

## 2015-10-19 LAB — PROTIME-INR
INR: 1.02
PROTHROMBIN TIME: 13.4 s (ref 11.4–15.2)

## 2015-10-19 LAB — BRAIN NATRIURETIC PEPTIDE: B Natriuretic Peptide: 1655.7 pg/mL — ABNORMAL HIGH (ref 0.0–100.0)

## 2015-10-19 LAB — APTT: APTT: 50 s — AB (ref 24–36)

## 2015-10-19 MED ORDER — ASPIRIN 81 MG PO CHEW: 324.0000 mg | CHEWABLE_TABLET | ORAL | Status: DC

## 2015-10-19 MED ORDER — FLUCONAZOLE 150 MG PO TABS
150.0000 mg | ORAL_TABLET | Freq: Every day | ORAL | Status: DC
  Administered 2015-10-19 – 2015-10-27 (×9): 150 mg via ORAL
  Filled 2015-10-19 (×8): qty 1
  Filled 2015-10-19: qty 2
  Filled 2015-10-19: qty 1

## 2015-10-19 MED ORDER — CARVEDILOL 3.125 MG PO TABS
3.1250 mg | ORAL_TABLET | Freq: Two times a day (BID) | ORAL | Status: DC
  Administered 2015-10-19 – 2015-10-27 (×18): 3.125 mg via ORAL
  Filled 2015-10-19 (×18): qty 1

## 2015-10-19 MED ORDER — FUROSEMIDE 10 MG/ML IJ SOLN
40.0000 mg | Freq: Every day | INTRAMUSCULAR | Status: DC
  Administered 2015-10-19 – 2015-10-20 (×2): 40 mg via INTRAVENOUS
  Filled 2015-10-19 (×2): qty 4

## 2015-10-19 MED ORDER — NITROGLYCERIN 0.4 MG SL SUBL: 0.4000 mg | SUBLINGUAL_TABLET | SUBLINGUAL | Status: DC | PRN

## 2015-10-19 MED ORDER — HEPARIN (PORCINE) IN NACL 100-0.45 UNIT/ML-% IJ SOLN
850.0000 [IU]/h | INTRAMUSCULAR | Status: DC
  Administered 2015-10-19: 850 [IU]/h via INTRAVENOUS
  Filled 2015-10-19: qty 250

## 2015-10-19 MED ORDER — CYCLOSPORINE 100 MG PO CAPS
100.0000 mg | ORAL_CAPSULE | Freq: Two times a day (BID) | ORAL | Status: DC
  Administered 2015-10-19 – 2015-10-26 (×16): 100 mg via ORAL
  Filled 2015-10-19 (×18): qty 1

## 2015-10-19 MED ORDER — ASPIRIN EC 81 MG PO TBEC
81.0000 mg | DELAYED_RELEASE_TABLET | Freq: Every day | ORAL | Status: DC
  Administered 2015-10-19 – 2015-10-27 (×9): 81 mg via ORAL
  Filled 2015-10-19 (×11): qty 1

## 2015-10-19 MED ORDER — INSULIN DETEMIR 100 UNIT/ML ~~LOC~~ SOLN
10.0000 [IU] | Freq: Two times a day (BID) | SUBCUTANEOUS | Status: DC
  Administered 2015-10-19 – 2015-10-26 (×14): 10 [IU] via SUBCUTANEOUS
  Filled 2015-10-19 (×16): qty 0.1

## 2015-10-19 MED ORDER — PANTOPRAZOLE SODIUM 40 MG PO TBEC
40.0000 mg | DELAYED_RELEASE_TABLET | Freq: Every day | ORAL | Status: DC
  Administered 2015-10-19 – 2015-10-27 (×10): 40 mg via ORAL
  Filled 2015-10-19 (×10): qty 1

## 2015-10-19 MED ORDER — MYCOPHENOLATE SODIUM 180 MG PO TBEC
180.0000 mg | DELAYED_RELEASE_TABLET | Freq: Every evening | ORAL | Status: DC
  Administered 2015-10-19 – 2015-11-01 (×13): 180 mg via ORAL
  Filled 2015-10-19 (×15): qty 1

## 2015-10-19 MED ORDER — HEPARIN (PORCINE) IN NACL 100-0.45 UNIT/ML-% IJ SOLN
800.0000 [IU]/h | INTRAMUSCULAR | Status: DC
  Administered 2015-10-20 – 2015-10-23 (×3): 800 [IU]/h via INTRAVENOUS
  Filled 2015-10-19 (×6): qty 250

## 2015-10-19 MED ORDER — ASPIRIN EC 81 MG PO TBEC: 81.0000 mg | DELAYED_RELEASE_TABLET | Freq: Every day | ORAL | Status: DC

## 2015-10-19 MED ORDER — ASPIRIN 81 MG PO CHEW
324.0000 mg | CHEWABLE_TABLET | Freq: Once | ORAL | Status: AC
  Administered 2015-10-19: 324 mg via ORAL
  Filled 2015-10-19: qty 4

## 2015-10-19 MED ORDER — INSULIN ASPART 100 UNIT/ML ~~LOC~~ SOLN
0.0000 [IU] | Freq: Three times a day (TID) | SUBCUTANEOUS | Status: DC
  Administered 2015-10-19: 3 [IU] via SUBCUTANEOUS
  Administered 2015-10-20 (×2): 1 [IU] via SUBCUTANEOUS
  Administered 2015-10-20: 5 [IU] via SUBCUTANEOUS
  Administered 2015-10-21: 2 [IU] via SUBCUTANEOUS
  Administered 2015-10-21: 3 [IU] via SUBCUTANEOUS
  Administered 2015-10-22: 18:00:00 2 [IU] via SUBCUTANEOUS
  Administered 2015-10-23: 18:00:00 3 [IU] via SUBCUTANEOUS
  Administered 2015-10-23 – 2015-10-24 (×3): 1 [IU] via SUBCUTANEOUS
  Administered 2015-10-24: 2 [IU] via SUBCUTANEOUS
  Administered 2015-10-25: 1 [IU] via SUBCUTANEOUS
  Administered 2015-10-26: 08:00:00 9 [IU] via SUBCUTANEOUS
  Administered 2015-10-27: 7 [IU] via SUBCUTANEOUS
  Administered 2015-10-28: 1 [IU] via SUBCUTANEOUS

## 2015-10-19 MED ORDER — PRASUGREL HCL 10 MG PO TABS
10.0000 mg | ORAL_TABLET | Freq: Every day | ORAL | Status: DC
  Administered 2015-10-19 – 2015-10-21 (×3): 10 mg via ORAL
  Filled 2015-10-19 (×4): qty 1

## 2015-10-19 MED ORDER — PREDNISONE 10 MG PO TABS
5.0000 mg | ORAL_TABLET | Freq: Every day | ORAL | Status: DC
  Administered 2015-10-19 – 2015-10-29 (×10): 5 mg via ORAL
  Filled 2015-10-19 (×10): qty 1

## 2015-10-19 MED ORDER — HEPARIN BOLUS VIA INFUSION
3000.0000 [IU] | Freq: Once | INTRAVENOUS | Status: AC
  Administered 2015-10-19: 3000 [IU] via INTRAVENOUS
  Filled 2015-10-19: qty 3000

## 2015-10-19 MED ORDER — FUROSEMIDE 10 MG/ML IJ SOLN
40.0000 mg | Freq: Once | INTRAMUSCULAR | Status: AC
  Administered 2015-10-19: 40 mg via INTRAVENOUS
  Filled 2015-10-19: qty 4

## 2015-10-19 MED ORDER — ONDANSETRON HCL 4 MG/2ML IJ SOLN: 4.0000 mg | Freq: Four times a day (QID) | INTRAMUSCULAR | Status: DC | PRN

## 2015-10-19 MED ORDER — ACETAMINOPHEN 325 MG PO TABS
650.0000 mg | ORAL_TABLET | ORAL | Status: DC | PRN
  Filled 2015-10-19: qty 2

## 2015-10-19 MED ORDER — ASPIRIN 300 MG RE SUPP: 300.0000 mg | RECTAL | Status: DC

## 2015-10-19 MED ORDER — NITROGLYCERIN IN D5W 200-5 MCG/ML-% IV SOLN
10.0000 ug/min | INTRAVENOUS | Status: DC
  Administered 2015-10-19 – 2015-10-28 (×3): 10 ug/min via INTRAVENOUS
  Filled 2015-10-19 (×4): qty 250

## 2015-10-19 MED ORDER — SODIUM CHLORIDE 0.9 % IV SOLN
INTRAVENOUS | Status: DC
  Administered 2015-10-19: 09:00:00 via INTRAVENOUS
  Administered 2015-10-21: 250 mL via INTRAVENOUS
  Administered 2015-10-22: 999 mL/h via INTRAVENOUS
  Administered 2015-10-26: 04:00:00 via INTRAVENOUS

## 2015-10-19 MED ORDER — ROSUVASTATIN CALCIUM 10 MG PO TABS
10.0000 mg | ORAL_TABLET | Freq: Every day | ORAL | Status: DC
  Administered 2015-10-19 – 2015-11-02 (×14): 10 mg via ORAL
  Filled 2015-10-19 (×14): qty 1

## 2015-10-19 NOTE — H&P (Signed)
Steven Riddle is an 60 y.o. male.   Chief Complaint: Progressive increasing shortness of breath associated with cough and leg swelling HPI: Patient is 60 year old male with past medical history significant for coronary artery disease history of non-Q-wave myocardial infarction status post multivessel PCI in May 2016 and August 2016 refused for CABG, compensated congestive heart failure, hypertension, insulin-requiring diabetes mellitus, peripheral neuropathy, hyperlipidemia, anemia of chronic disease, depression, history of end-stage renal disease status post renal transplant, peripheral vascular disease, depression, came to the ER complaining of cough associated with progressive increasing shortness of breath and leg swelling for last 1 week. States took over-the-counter medication without relief so decided to come to ED. Patient denies any fever or chills. Denies any chest pain nausea vomiting diaphoresis. Patient does give history of PND orthopnea and leg swelling. Denies palpitation lightheadedness or syncope. EKG done in the ED showed normal sinus rhythm with old anteroseptal wall MI right bundle branch block with secondary ST-T wave changes and was noted to have minimally elevated troponin I.  Past Medical History:  Diagnosis Date  . Anemia of chronic disease    /notes 09/06/2014  . Coronary artery disease   . HYPERKALEMIA 04/09/2009  . HYPERLIPIDEMIA 01/10/2007  . HYPERTENSION 01/10/2007  . Non-Q wave myocardial infarction [facility])    "found evidence of one when hospitalized in 06/2014"  . Palpitations 03/19/2008  . PERIPHERAL NEUROPATHY 01/10/2007  . Pneumonia 06/2014  . Proteinuria 06/07/2008  . RENAL INSUFFICIENCY, CHRONIC 06/07/2008  . Renal transplant recipient   . 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 Type II diabetes mellitus ([place]) dx'd ~ 1998    Past Surgical History:  Procedure Laterality Date  . CARDIAC CATHETERIZATION N/A 06/04/2014   Procedure: Left Heart Cath and Coronary  Angiography;  Surgeon: Charolette Forward, MD;  Location: [place] INVASIVE CV LAB CUPID;  Service: Cardiovascular;  Laterality: N/A;  . CARDIAC CATHETERIZATION N/A 06/04/2014   Procedure: Coronary Stent Intervention;  Surgeon: Charolette Forward, MD;  Location: [place] CV LAB CUPID;  Service: Cardiovascular;  Laterality: N/A;  . CARDIAC CATHETERIZATION N/A 06/07/2014   Procedure: Coronary Stent Intervention;  Surgeon: Charolette Forward, MD;  Location: [place] INVASIVE CV LAB CUPID;  Service: Cardiovascular;  Laterality: N/A;  . CARDIAC CATHETERIZATION N/A 09/06/2014   Procedure: Left Heart Cath and Coronary Angiography;  Surgeon: Charolette Forward, MD;  Location: [place] CV LAB;  Service: Cardiovascular;  Laterality: N/A;  . CARDIAC CATHETERIZATION N/A 09/06/2014   Procedure: Coronary Stent Intervention;  Surgeon: Charolette Forward, MD;  Location: [place] CV LAB;  Service: Cardiovascular;  Laterality: N/A;  . CATARACT EXTRACTION W/ INTRAOCULAR LENS IMPLANT Left ?06/2008  . CORONARY ANGIOPLASTY WITH STENT PLACEMENT  09/06/2014   "2 stents"  . NEPHRECTOMY TRANSPLANTED ORGAN  2013    Family History  Problem Relation Age of Onset  . Diabetes Mother   . Hypertension Mother   . Stroke Mother   . Diabetes Father   . Diabetes Sister   . Diabetes Brother   . Hypertension Brother   . Hypertension Sister   . Cancer Neg Hx    Social History:  reports that he has never smoked. He has never used smokeless tobacco. He reports that he does not drink alcohol or use drugs.  Allergies: No Known Allergies  Medications Prior to Admission  Medication Sig Dispense Refill  . aspirin EC 81 MG tablet Take 81 mg by mouth daily.    . BD ULTRA-FINE PEN NEEDLES 29G X 12.7MM MISC USE AS DIRECTED 100 each 5  .  brimonidine (ALPHAGAN) 0.2 % ophthalmic solution Place 1 drop into the left eye at bedtime.    . carvedilol (COREG) 6.25 MG tablet Take 6.25 mg by mouth daily.    . cycloSPORINE (SANDIMMUNE) 100 MG capsule Take 100 mg by mouth 2 (two)  times daily.     . dorzolamide-timolol (COSOPT) 22.3-6.8 MG/ML ophthalmic solution Apply 1 drop to eye at bedtime.    . fluconazole (DIFLUCAN) 150 MG tablet Take 150 mg by mouth daily.    . insulin detemir (LEVEMIR) 100 UNIT/ML injection Inject 25 Units into the skin 2 (two) times daily. And pen needles 1/day    . latanoprost (XALATAN) 0.005 % ophthalmic solution Place 1 drop into the left eye at bedtime.    . Multiple Vitamin (MULTIVITAMIN WITH MINERALS) TABS tablet Take 1 tablet by mouth daily. CENTRUM MULTIVITAMIN FOR 50+    . mycophenolate (MYFORTIC) 180 MG EC tablet Take 180 mg by mouth every evening.     . prasugrel (EFFIENT) 10 MG TABS tablet Take 1 tablet (10 mg total) by mouth daily. 30 tablet 0  . predniSONE (DELTASONE) 5 MG tablet Take 5 mg by mouth daily.    . rosuvastatin (CRESTOR) 10 MG tablet Take 10 mg by mouth daily.    . rosuvastatin (CRESTOR) 5 MG tablet Take 1 tablet (5 mg total) by mouth daily at 6 PM. (Patient not taking: Reported on 10/19/2015) 30 tablet 0    Results for orders placed or performed during the hospital encounter of 10/18/15 (from the past 48 hour(s))  Basic metabolic panel     Status: Abnormal   Collection Time: 10/18/15 12:20 AM  Result Value Ref Range   Sodium 140 135 - 145 mmol/L   Potassium 5.3 (H) 3.5 - 5.1 mmol/L   Chloride 109 101 - 111 mmol/L   CO2 24 22 - 32 mmol/L   Glucose, Bld 171 (H) 65 - 99 mg/dL   BUN 44 (H) 6 - 20 mg/dL   Creatinine, Ser 1.45 (H) 0.61 - 1.24 mg/dL   Calcium 9.1 8.9 - 10.3 mg/dL   GFR calc non Af Amer 51 (L) >60 mL/min   GFR calc Af Amer 59 (L) >60 mL/min    Comment: (NOTE) The eGFR has been calculated using the CKD EPI equation. This calculation has not been validated in all clinical situations. eGFR's persistently <60 mL/min signify possible Chronic Kidney Disease.    Anion gap 7 5 - 15  CBC     Status: Abnormal   Collection Time: 10/18/15 12:20 AM  Result Value Ref Range   WBC 9.1 4.0 - 10.5 K/uL   RBC 4.86  4.22 - 5.81 MIL/uL   Hemoglobin 13.7 13.0 - 17.0 g/dL   HCT 41.0 39.0 - 52.0 %   MCV 84.4 78.0 - 100.0 fL   MCH 28.2 26.0 - 34.0 pg   MCHC 33.4 30.0 - 36.0 g/dL   RDW 16.4 (H) 11.5 - 15.5 %   Platelets 324 150 - 400 K/uL  Brain natriuretic peptide     Status: Abnormal   Collection Time: 10/18/15 12:20 AM  Result Value Ref Range   B Natriuretic Peptide 1,655.7 (H) 0.0 - 100.0 pg/mL  Differential     Status: None   Collection Time: 10/18/15 12:20 AM  Result Value Ref Range   Neutrophils Relative % 72 %   Lymphocytes Relative 20 %   Monocytes Relative 7 %   Eosinophils Relative 1 %   Basophils Relative 0 %  Neutro Abs 6.6 1.7 - 7.7 K/uL   Lymphs Abs 1.8 0.7 - 4.0 K/uL   Monocytes Absolute 0.6 0.1 - 1.0 K/uL   Eosinophils Absolute 0.1 0.0 - 0.7 K/uL   Basophils Absolute 0.0 0.0 - 0.1 K/uL   Smear Review LARGE PLATELETS PRESENT   APTT     Status: Abnormal   Collection Time: 10/19/15 12:20 AM  Result Value Ref Range   aPTT 50 (H) 24 - 36 seconds    Comment:        IF BASELINE aPTT IS ELEVATED, SUGGEST PATIENT RISK ASSESSMENT BE USED TO DETERMINE APPROPRIATE ANTICOAGULANT THERAPY.   Protime-INR     Status: None   Collection Time: 10/19/15 12:20 AM  Result Value Ref Range   Prothrombin Time 13.4 11.4 - 15.2 seconds   INR 1.02   I-stat troponin, ED     Status: Abnormal   Collection Time: 10/19/15 12:25 AM  Result Value Ref Range   Troponin i, poc 0.19 (HH) 0.00 - 0.08 ng/mL   Comment 3            Comment: Due to the release kinetics of cTnI, a negative result within the first hours of the onset of symptoms does not rule out myocardial infarction with certainty. If myocardial infarction is still suspected, repeat the test at appropriate intervals.   Urinalysis, Routine w reflex microscopic     Status: Abnormal   Collection Time: 10/19/15  2:55 AM  Result Value Ref Range   Color, Urine YELLOW YELLOW   APPearance CLEAR CLEAR   Specific Gravity, Urine 1.022 1.005 - 1.030    pH 6.0 5.0 - 8.0   Glucose, UA 250 (A) NEGATIVE mg/dL   Hgb urine dipstick NEGATIVE NEGATIVE   Bilirubin Urine NEGATIVE NEGATIVE   Ketones, ur NEGATIVE NEGATIVE mg/dL   Protein, ur 100 (A) NEGATIVE mg/dL   Nitrite NEGATIVE NEGATIVE   Leukocytes, UA NEGATIVE NEGATIVE  Urine microscopic-add on     Status: Abnormal   Collection Time: 10/19/15  2:55 AM  Result Value Ref Range   Squamous Epithelial / LPF 0-5 (A) NONE SEEN   WBC, UA 0-5 0 - 5 WBC/hpf   RBC / HPF NONE SEEN 0 - 5 RBC/hpf   Bacteria, UA RARE (A) NONE SEEN  I-stat troponin, ED     Status: Abnormal   Collection Time: 10/19/15  3:57 AM  Result Value Ref Range   Troponin i, poc 0.16 (HH) 0.00 - 0.08 ng/mL   Comment NOTIFIED PHYSICIAN    Comment 3            Comment: Due to the release kinetics of cTnI, a negative result within the first hours of the onset of symptoms does not rule out myocardial infarction with certainty. If myocardial infarction is still suspected, repeat the test at appropriate intervals.   CBC     Status: Abnormal   Collection Time: 10/19/15  4:59 AM  Result Value Ref Range   WBC 8.8 4.0 - 10.5 K/uL   RBC 4.68 4.22 - 5.81 MIL/uL   Hemoglobin 12.9 (L) 13.0 - 17.0 g/dL   HCT 40.4 39.0 - 52.0 %   MCV 86.3 78.0 - 100.0 fL   MCH 27.6 26.0 - 34.0 pg   MCHC 31.9 30.0 - 36.0 g/dL   RDW 16.6 (H) 11.5 - 15.5 %   Platelets 306 150 - 400 K/uL  Glucose, capillary     Status: None   Collection Time: 10/19/15  6:35 AM  Result Value Ref Range   Glucose-Capillary 77 65 - 99 mg/dL   Comment 1 Notify RN    Comment 2 Document in Chart    Dg Chest 2 View  Result Date: 10/19/2015 CLINICAL DATA:  Cough for 1 week. Increased shortness of breath for 3 days. Lower extremity edema. EXAM: CHEST  2 VIEW COMPARISON:  04/01/2015 FINDINGS: Low lung volumes. Heart appears enlarged, likely accentuated by technique. There is atherosclerosis of the thoracic aorta. Ill-defined right supra and infrahilar opacities. Small right  pleural effusion. Central vascular congestion with question of minimal edema. No pneumothorax. No acute osseous abnormality. IMPRESSION: 1. Mild vascular congestion, question of minimal pulmonary edema. Suspect mild congestive failure. 2. Right supra and infrahilar opacities, suspicious for pneumonia, less likely asymmetric edema. 3. Small right pleural effusion. 4. Recommend radiographic follow-up to resolution. Electronically Signed   By: Jeb Levering M.D.   On: 10/19/2015 00:01    Review of Systems  Constitutional: Negative for chills and fever.  Eyes: Negative for double vision.  Respiratory: Positive for cough and shortness of breath.   Cardiovascular: Positive for orthopnea, leg swelling and PND. Negative for chest pain.  Gastrointestinal: Negative for nausea and vomiting.  Genitourinary: Negative for dysuria.  Neurological: Positive for dizziness. Negative for headaches.    Blood pressure (!) 166/90, pulse 77, temperature 97.8 F (36.6 C), temperature source Oral, resp. rate 18, height '5\' 7"'$  (1.702 m), weight 147 lb 4.8 oz (66.8 kg), SpO2 100 %. Physical Exam  Constitutional: He is oriented to person, place, and time.  HENT:  Head: Normocephalic and atraumatic.  Eyes: Conjunctivae are normal. Pupils are equal, round, and reactive to light. Left eye exhibits no discharge. No scleral icterus.  Neck: Normal range of motion. Neck supple. JVD present.  Cardiovascular: Normal rate and regular rhythm.   Murmur (Soft systolic murmur and S3 gallop noted) heard. Respiratory:  Bibasilar rales noted  GI: Soft. Bowel sounds are normal. He exhibits no distension. There is no tenderness.  Musculoskeletal:  No clubbing cyanosis 2+ edema noted  Neurological: He is alert and oriented to person, place, and time.     Assessment/Plan Acute decompensated systolic heart failure Probable small non-Q-wave myocardial infarction Multivessel CAD status post multivessel PCI in the  past Hypertension Diabetes mellitus Hyperlipidemia History of end-stage renal disease status post renal transplant Chronic kidney disease Anemia of chronic disease Peripheral neuropathy Peripheral vascular disease Depression Plan As per orders    Charolette Forward, MD 10/19/2015, 8:09 AM

## 2015-10-19 NOTE — Progress Notes (Signed)
ANTICOAGULATION CONSULT NOTE - Initial Consult  Pharmacy Consult for Heparin Indication: chest pain/ACS  No Known Allergies  Patient Measurements: Height: 5\' 7"  (170.2 cm) Weight: 150 lb (68 kg) IBW/kg (Calculated) : 66.1 Heparin Dosing Weight:   Vital Signs: Temp: 98.1 F (36.7 C) (09/15 2239) Temp Source: Oral (09/15 2239) BP: 141/76 (09/16 0056) Pulse Rate: 75 (09/16 0056)  Labs:  Recent Labs  10/18/15 0020  HGB 13.7  HCT 41.0  PLT 324  CREATININE 1.45*    Estimated Creatinine Clearance: 50 mL/min (by C-G formula based on SCr of 1.45 mg/dL (H)).   Medical History: Past Medical History:  Diagnosis Date  . Anemia of chronic disease    /notes 09/06/2014  . Coronary artery disease   . HYPERKALEMIA 04/09/2009  . HYPERLIPIDEMIA 01/10/2007  . HYPERTENSION 01/10/2007  . Non-Q wave myocardial infarction [facility](HCC)    "found evidence of one when hospitalized in 06/2014"  . Palpitations 03/19/2008  . PERIPHERAL NEUROPATHY 01/10/2007  . Pneumonia 06/2014  . Proteinuria 06/07/2008  . RENAL INSUFFICIENCY, CHRONIC 06/07/2008  . Renal transplant recipient   . 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 Type II diabetes mellitus ([facility]) dx'd ~ 1998    Medications:  Infusions:  . heparin      Assessment: Kidney transplant patient with ShOB and now + troponin in ED.  MD wants heparin per pharmacy.  No oral anticoagulants noted on med rec.   Goal of Therapy:  Heparin level 0.3-0.7 units/ml Monitor platelets by anticoagulation protocol: Yes   Plan:  Heparin bolus 3000 units iv x1 Heparin drip at 850 units/hr Daily CBC Next heparin level at 0900    Darlina GuysGrimsley Jr, Jacquenette ShoneJulian Crowford 10/19/2015,12:58 AM

## 2015-10-19 NOTE — Progress Notes (Signed)
CRITICAL VALUE ALERT  Critical value received:  troponin  Date of notification:  10/19/15  Time of notification:  1028  Critical value read back: yes  Nurse who received alert:  Jilda PandaBethany Kazuto Sevey  MD notified (1st page):  Dr Sharyn LullHarwani  Time of first page:  1029

## 2015-10-19 NOTE — ED Notes (Signed)
[facility]

## 2015-10-19 NOTE — Progress Notes (Signed)
Paged Dr. Sharyn [facility]ullHarwani [facility]. Pt would need stepdown orders for nitro to be titrated or if nitro gtt for BP management. Orders for titration dc. Will start nitro gtt at 10 mcg/min per Dr. Sharyn LullHarwani.

## 2015-10-19 NOTE — Progress Notes (Signed)
ANTICOAGULATION CONSULT NOTE - Follow up  Pharmacy Consult for Heparin Indication: chest pain/ACS  No Known Allergies  Patient Measurements: Height: 5\' 7"  (170.2 cm) Weight: 147 lb 4.8 oz (66.8 kg) IBW/kg (Calculated) : 66.1 Heparin Dosing Weight: 66.8 kg  Vital Signs: Temp: 97.8 F (36.6 C) (09/16 0624) Temp Source: Oral (09/16 0624) BP: 162/81 (09/16 1008) Pulse Rate: 77 (09/16 1008)  Labs:  Recent Labs  10/18/15 0020 10/19/15 0020 10/19/15 0459 10/19/15 0857 10/19/15 1139  HGB 13.7  --  12.9* 13.5  --   HCT 41.0  --  40.4 42.3  --   PLT 324  --  306 303  --   APTT  --  50*  --   --   --   LABPROT  --  13.4  --   --   --   INR  --  1.02  --   --   --   HEPARINUNFRC  --   --   --   --  0.78*  CREATININE 1.45*  --   --  1.39*  --   TROPONINI  --   --   --  0.30*  --     Estimated Creatinine Clearance: 52.2 mL/min (by C-G formula based on SCr of 1.39 mg/dL (H)).   Medical History: Past Medical History:  Diagnosis Date  . Anemia of chronic disease    /notes 09/06/2014  . Coronary artery disease   . HYPERKALEMIA 04/09/2009  . HYPERLIPIDEMIA 01/10/2007  . HYPERTENSION 01/10/2007  . Non-Q wave myocardial infarction [facility](HCC)    "found evidence of one when hospitalized in 06/2014"  . Palpitations 03/19/2008  . PERIPHERAL NEUROPATHY 01/10/2007  . Pneumonia 06/2014  . Proteinuria 06/07/2008  . RENAL INSUFFICIENCY, CHRONIC 06/07/2008  . Renal transplant recipient   . 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 Type II diabetes mellitus ([facility]) dx'd ~ 1998    Medications:  Infusions:  . sodium chloride 10 mL/hr at 10/19/15 0845  . heparin 850 Units/hr (10/19/15 0103)  . nitroGLYCERIN 10 mcg/min (10/19/15 [id number])    Assessment:  60 y.o male with h/o kidney transplant with ShOB, admitted 10/18/15 with + troponin in ED.  Pharmacy consulted for heparin infusion protocol.   No oral anticoagulants PTA  noted on med rec.   First ~10hr heparin level is 0.78, slightly above goal on  heparin drip 850 units/hr. CBC is within normal.  No bleeding noted.  IV site is good per RN's report  Goal of Therapy:  Heparin level 0.3-0.7 units/ml Monitor platelets by anticoagulation protocol: Yes   Plan:  Decrease IV Heparin drip to 800 units/hr Check 6 hr hepain level. Daily heparin level and CBC    Noah Delaineuth Raylyn Speckman, RPh Clinical Pharmacist Pager: [phone] 10/19/2015,12:36 PM

## 2015-10-19 NOTE — Progress Notes (Signed)
Patient arrived in the unit accompanied by two EMS personnel via stretcher. Orientation to the unit given. Patient verbalizes understanding.

## 2015-10-19 NOTE — ED Notes (Addendum)
Notified EDP,James,MD., pt. I-stat troponin results 0.19. And RN,Ajsa made aware.

## 2015-10-20 ENCOUNTER — Inpatient Hospital Stay (HOSPITAL_COMMUNITY): Payer: PPO

## 2015-10-20 LAB — GLUCOSE, CAPILLARY
GLUCOSE-CAPILLARY: 123 mg/dL — AB (ref 65–99)
GLUCOSE-CAPILLARY: 144 mg/dL — AB (ref 65–99)
GLUCOSE-CAPILLARY: 253 mg/dL — AB (ref 65–99)
Glucose-Capillary: 254 mg/dL — ABNORMAL HIGH (ref 65–99)

## 2015-10-20 LAB — CBC
HCT: 40 % (ref 39.0–52.0)
Hemoglobin: 12.4 g/dL — ABNORMAL LOW (ref 13.0–17.0)
MCH: 26.9 pg (ref 26.0–34.0)
MCHC: 31 g/dL (ref 30.0–36.0)
MCV: 86.8 fL (ref 78.0–100.0)
PLATELETS: 300 10*3/uL (ref 150–400)
RBC: 4.61 MIL/uL (ref 4.22–5.81)
RDW: 16.4 % — AB (ref 11.5–15.5)
WBC: 9 10*3/uL (ref 4.0–10.5)

## 2015-10-20 LAB — SODIUM, URINE, RANDOM: SODIUM UR: 119 mmol/L

## 2015-10-20 LAB — HEPARIN LEVEL (UNFRACTIONATED)
HEPARIN UNFRACTIONATED: 0.31 [IU]/mL (ref 0.30–0.70)
HEPARIN UNFRACTIONATED: 0.54 [IU]/mL (ref 0.30–0.70)

## 2015-10-20 LAB — LIPID PANEL
Cholesterol: 136 mg/dL (ref 0–200)
HDL: 29 mg/dL — ABNORMAL LOW (ref >40)
LDL CALC: 87 mg/dL (ref 0–99)
Total CHOL/HDL Ratio: 4.7 RATIO
Triglycerides: 101 mg/dL (ref <150)
VLDL: 20 mg/dL (ref 0–40)

## 2015-10-20 LAB — CREATININE, URINE, RANDOM: Creatinine, Urine: 29.09 mg/dL

## 2015-10-20 LAB — TROPONIN I: TROPONIN I: 0.23 ng/mL — AB (ref <0.03)

## 2015-10-20 LAB — BASIC METABOLIC PANEL
ANION GAP: 11 (ref 5–15)
BUN: 36 mg/dL — ABNORMAL HIGH (ref 6–20)
CALCIUM: 8.7 mg/dL — AB (ref 8.9–10.3)
CO2: 21 mmol/L — ABNORMAL LOW (ref 22–32)
Chloride: 109 mmol/L (ref 101–111)
Creatinine, Ser: 1.46 mg/dL — ABNORMAL HIGH (ref 0.61–1.24)
GFR, EST AFRICAN AMERICAN: 58 mL/min — AB (ref >60)
GFR, EST NON AFRICAN AMERICAN: 50 mL/min — AB (ref >60)
GLUCOSE: 100 mg/dL — AB (ref 65–99)
POTASSIUM: 4.3 mmol/L (ref 3.5–5.1)
SODIUM: 141 mmol/L (ref 135–145)

## 2015-10-20 LAB — ECHOCARDIOGRAM COMPLETE
HEIGHTINCHES: 67 in
WEIGHTICAEL: 2294.4 [oz_av]

## 2015-10-20 LAB — BRAIN NATRIURETIC PEPTIDE: B NATRIURETIC PEPTIDE 5: 1856.1 pg/mL — AB (ref 0.0–100.0)

## 2015-10-20 NOTE — Progress Notes (Signed)
  Echocardiogram 2D Echocardiogram has been performed.  Arvil ChacoFoster, Jimesha Rising 10/20/2015, 4:19 PM

## 2015-10-20 NOTE — Progress Notes (Signed)
Subjective:  Denies any chest pain.  States breathing has improved.  Troponin I mildly elevated and trending down  Objective:  Vital Signs in the last 24 hours: Temp:  [97.4 F (36.3 C)-98.4 F (36.9 C)] 98.2 F (36.8 C) (09/17 0800) Pulse Rate:  [74-93] 93 (09/17 0800) Resp:  [18-20] 18 (09/17 0601) BP: (137-154)/(68-76) 144/70 (09/17 0800) SpO2:  [94 %-98 %] 97 % (09/17 0800) Weight:  [143 lb 6.4 oz (65 kg)] 143 lb 6.4 oz (65 kg) (09/17 0601)  Intake/Output from previous day: 09/16 0701 - 09/17 0700 In: 1293.9 [P.O.:1200; I.V.:93.9] Out: 1950 [Urine:1950] Intake/Output from this shift: Total I/O In: 240 [P.O.:240] Out: -   Physical Exam: Neck: no adenopathy, no carotid bruit, no JVD and supple, symmetrical, trachea midline Lungs: decreased breath sounds at bases with faint rales noted Heart: regular rate and rhythm, S1, S2 normal and soft systolic murmur and S3 gallop noted Abdomen: soft, non-tender; bowel sounds normal; no masses,  no organomegaly Extremities: no clubbing, cyanosis, 1+ edema noted  Lab Results:  Recent Labs  10/19/15 0857 10/20/15 0608  WBC 10.1 9.0  HGB 13.5 12.4*  PLT 303 300    Recent Labs  10/19/15 0857 10/20/15 0608  NA 142 141  K 4.4 4.3  CL 109 109  CO2 22 21*  GLUCOSE 87 100*  BUN 39* 36*  CREATININE 1.39* 1.46*    Recent Labs  10/19/15 2105 10/20/15 0608  TROPONINI 0.23* 0.23*   Hepatic Function Panel  Recent Labs  10/19/15 0857  PROT 7.1  ALBUMIN 2.7*  AST 27  ALT 27  ALKPHOS 103  BILITOT 0.9    Recent Labs  10/20/15 0608  CHOL 136   No results for input(s): PROTIME in the last 72 hours.  Imaging: Imaging results have been reviewed and Dg Chest 2 View  Result Date: 10/19/2015 CLINICAL DATA:  Cough for 1 week. Increased shortness of breath for 3 days. Lower extremity edema. EXAM: CHEST  2 VIEW COMPARISON:  04/01/2015 FINDINGS: Low lung volumes. Heart appears enlarged, likely accentuated by technique.  There is atherosclerosis of the thoracic aorta. Ill-defined right supra and infrahilar opacities. Small right pleural effusion. Central vascular congestion with question of minimal edema. No pneumothorax. No acute osseous abnormality. IMPRESSION: 1. Mild vascular congestion, question of minimal pulmonary edema. Suspect mild congestive failure. 2. Right supra and infrahilar opacities, suspicious for pneumonia, less likely asymmetric edema. 3. Small right pleural effusion. 4. Recommend radiographic follow-up to resolution. Electronically Signed   By: Rubye OaksMelanie  Ehinger M.D.   On: 10/19/2015 00:01    Cardiac Studies:  Assessment/Plan:  Acute decompensated systolic heart failure Probable small non-Q-wave myocardial infarction Multivessel CAD status post multivessel PCI in the past Hypertension Diabetes mellitus Hyperlipidemia History of end-stage renal disease status post renal transplant Chronic kidney disease Anemia of chronic disease Peripheral neuropathy Peripheral vascular disease Depression Plan Continue present management. Check 2-D echo. Will get renal service consult. Discussed with patient and his wife regarding left cardiac catheterization, possible PTCA stenting versus Benefits, I.e., death, MI, stroke, need for emergency CABG, local vascular complications.  Worsening renal function.  Staging the procedure, etc. And consents for PCI Will schedule  once fully compensated.  LOS: 1 day    [name], Geoge Lawrance 10/20/2015, 11:45 AM

## 2015-10-20 NOTE — Consult Note (Signed)
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 (Cardiology)  HPI:  60 year old man of [place] origin with history of ESRD from poorly controlled underlying diabetes and hypertension who underwent LURD kidney transplant in [place] in July, 2013. He has had a fortunately uneventful course except for episodes of CMV/BK viremia treated with adjustment of immunosuppression. His last creatinine noted was 1.1 in June, 2017. He also has history of CAD status post multivessel PCI in 2016 (previously refused CABG), CHF, hypertension, poorly controlled insulin-dependent diabetes mellitus (A1c around 10% when last seen in June), peripheral neuropathy and dyslipidemia. He was admitted 2 days ago with one-week history of worsening shortness of breath and leg swelling along with the nonproductive cough for which he treated himself for a URI unsuccessfully. Denies any fever or chills and does not have any nausea, vomiting or diarrhea. He denies any chest pain and reports that since admission and after diuresis, shortness of breath has significantly improved. He denies any preceding irritative or obstructive urinary symptoms.  We are consulted to follow along his renal function after the development of acute on chronic renal failure (creatinine has risen to 1.45 after diuretic therapy) in anticipation of coronary angiogram on Tuesday 9/19.   He is on immunosuppression with cyclosporine (trough levels boosted by fluconazole), Myfortic and prednisone. He is followed up by Dr. Kathrene BongoGoldsborough.  Past Medical History:  Diagnosis Date  . Anemia of chronic disease    /notes 09/06/2014  . Coronary artery disease   . HYPERKALEMIA 04/09/2009  . HYPERLIPIDEMIA 01/10/2007  . HYPERTENSION 01/10/2007  . Non-Q wave myocardial infarction [facility](HCC)    "found evidence of one when hospitalized in 06/2014"  . Palpitations 03/19/2008  . PERIPHERAL NEUROPATHY 01/10/2007  . Pneumonia 06/2014   . Proteinuria 06/07/2008  . RENAL INSUFFICIENCY, CHRONIC 06/07/2008  . Renal transplant recipient   . 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 Type II diabetes mellitus (HCC) dx'd ~ 1998    Past Surgical History:  Procedure Laterality Date  . CARDIAC CATHETERIZATION N/A 06/04/2014   Procedure: Left Heart Cath and Coronary Angiography;  Surgeon: Rinaldo CloudMohan Harwani, MD;  Location: [facility];  Service: Cardiovascular;  Laterality: N/A;  . CARDIAC CATHETERIZATION N/A 06/04/2014   Procedure: Coronary Stent Intervention;  Surgeon: Rinaldo CloudMohan Harwani, MD;  Location: [facility];  Service: Cardiovascular;  Laterality: N/A;  . CARDIAC CATHETERIZATION N/A 06/07/2014   Procedure: Coronary Stent Intervention;  Surgeon: Rinaldo CloudMohan Harwani, MD;  Location: [facility];  Service: Cardiovascular;  Laterality: N/A;  . CARDIAC CATHETERIZATION N/A 09/06/2014   Procedure: Left Heart Cath and Coronary Angiography;  Surgeon: Rinaldo CloudMohan Harwani, MD;  Location: [facility];  Service: Cardiovascular;  Laterality: N/A;  . CARDIAC CATHETERIZATION N/A 09/06/2014   Procedure: Coronary Stent Intervention;  Surgeon: Rinaldo CloudMohan Harwani, MD;  Location: [facility];  Service: Cardiovascular;  Laterality: N/A;  . CATARACT EXTRACTION W/ INTRAOCULAR LENS IMPLANT Left ?06/2008  . CORONARY ANGIOPLASTY WITH STENT PLACEMENT  09/06/2014   "2 stents"  . NEPHRECTOMY TRANSPLANTED ORGAN  2013    Family History  Problem Relation Age of Onset  . Diabetes Mother   . Hypertension Mother   . Stroke Mother   . Diabetes Father   . Diabetes Sister   . Diabetes Brother   . Hypertension Brother   . Hypertension Sister   . Cancer Neg Hx     Social History:  reports that he has never smoked.  He has never used smokeless tobacco. He reports that he does not drink alcohol or use drugs.  Allergies: No Known Allergies  Medications:  Scheduled: . aspirin EC  81 mg Oral Daily  . carvedilol  3.125 mg Oral BID  .  cycloSPORINE  100 mg Oral BID  . fluconazole  150 mg Oral Daily  . furosemide  40 mg Intravenous Daily  . insulin aspart  0-9 Units Subcutaneous TID WC  . insulin detemir  10 Units Subcutaneous BID  . mycophenolate  180 mg Oral QPM  . pantoprazole  40 mg Oral Q0600  . prasugrel  10 mg Oral Daily  . predniSONE  5 mg Oral Daily  . rosuvastatin  10 mg Oral Daily    BMP Latest Ref Rng & Units 10/20/2015 10/19/2015 10/18/2015  Glucose 65 - 99 mg/dL 409(W) 87 119(J)  BUN 6 - 20 mg/dL 47(W) 29(F) 62(Z)  Creatinine 0.61 - 1.24 mg/dL 3.08(M) 5.78(I) 6.96(E)  Sodium 135 - 145 mmol/L 141 142 140  Potassium 3.5 - 5.1 mmol/L 4.3 4.4 5.3(H)  Chloride 101 - 111 mmol/L 109 109 109  CO2 22 - 32 mmol/L 21(L) 22 24  Calcium 8.9 - 10.3 mg/dL 9.5(M) 9.2 9.1   CBC Latest Ref Rng & Units 10/20/2015 10/19/2015 10/19/2015  WBC 4.0 - 10.5 K/uL 9.0 10.1 8.8  Hemoglobin 13.0 - 17.0 g/dL 12.4(L) 13.5 12.9(L)  Hematocrit 39.0 - 52.0 % 40.0 42.3 40.4  Platelets 150 - 400 K/uL 300 303 306    Dg Chest 2 View  Result Date: 10/19/2015 CLINICAL DATA:  Cough for 1 week. Increased shortness of breath for 3 days. Lower extremity edema. EXAM: CHEST  2 VIEW COMPARISON:  04/01/2015 FINDINGS: Low lung volumes. Heart appears enlarged, likely accentuated by technique. There is atherosclerosis of the thoracic aorta. Ill-defined right supra and infrahilar opacities. Small right pleural effusion. Central vascular congestion with question of minimal edema. No pneumothorax. No acute osseous abnormality. IMPRESSION: 1. Mild vascular congestion, question of minimal pulmonary edema. Suspect mild congestive failure. 2. Right supra and infrahilar opacities, suspicious for pneumonia, less likely asymmetric edema. 3. Small right pleural effusion. 4. Recommend radiographic follow-up to resolution. Electronically Signed   By: Rubye Oaks M.D.   On: 10/19/2015 00:01    Review of Systems  Constitutional: Negative.   HENT: Negative.   Eyes:  Negative.   Respiratory: Positive for cough and shortness of breath. Negative for hemoptysis and sputum production.   Cardiovascular: Positive for orthopnea and leg swelling. Negative for chest pain, palpitations and claudication.  Gastrointestinal: Negative.   Genitourinary: Negative.   Musculoskeletal: Negative.   Skin: Negative.   Neurological: Negative.   Psychiatric/Behavioral: Negative.    Blood pressure 127/64, pulse 78, temperature 97.8 F (36.6 C), temperature source Oral, resp. rate 18, height 5\' 7"  (1.702 m), weight 65 kg (143 lb 6.4 oz), SpO2 97 %. Physical Exam  Nursing note and vitals reviewed. Constitutional: He is oriented to person, place, and time. He appears well-developed and well-nourished. No distress.  HENT:  Head: Normocephalic and atraumatic.  Left Ear: External ear normal.  Mouth/Throat: No oropharyngeal exudate.  Eyes: Conjunctivae are normal. Pupils are equal, round, and reactive to light. No scleral icterus.  Neck: Normal range of motion. No JVD present. No thyromegaly present.  Cardiovascular: Normal rate, regular rhythm and normal heart sounds.   Respiratory: Effort normal. He has rales.  Fine rales right base > left base  GI: Soft. Bowel sounds are normal. He exhibits no  distension. There is no tenderness. There is no rebound.  Musculoskeletal: Normal range of motion. He exhibits edema.  1+ edema bilateral ankles  Neurological: He is alert and oriented to person, place, and time.  Skin: Skin is warm and dry. No rash noted. No erythema.  Psychiatric: He has a normal mood and affect.    Assessment/Plan: 1. Acute kidney injury on chronic kidney disease stage IIT: Appears to be hemodynamically mediated with CHF exacerbation, ongoing appropriate management with judicious diuretic therapy and close monitoring. Clinically getting better. We'll check urine electrolytes today and cyclosporine trough level tomorrow morning. No evidence of urinary retention based  on physical exam an allograft site appears to be nontender. With the presence of his baseline chronic kidney disease stage II, poorly controlled diabetes mellitus and congestive heart failure, his risk of contrast-induced nephropathy is about 25% (assuming contrast volume <140mL) and risk for needing dialysis because of that is about 1% based on the CIN risk calculator. We cannot give him precontrast exposure intravenous fluids because of his presentation with volume overload/CHF exacerbation and the best strategy would be for minimization of contrast volume and leaving him off diuretics for at least 24 hours before the procedure (recommending stopping furosemide after his dose in the a.m. tomorrow). Avoid hypotension and of course nonsteroidals.  Reference:    http://www.renalguard.com/educational/contrast-induced-nephropathy-risk-calculator  2. CHF exacerbation: Ongoing diuretic therapy at this time with symptomatic improvement, plans in place for coronary angiogram in 2 days. It appears that he is Lasix nave prior to admission. Evaluated for ACS and by EKG found to have probable small non-Q-wave MI. 3. Hypertension: Blood pressure is well controlled at this time, continue to avoid hypotension.  4. Insulin-dependent diabetes mellitus: Continue optimization of diabetes control while avoiding hypoglycemic events/critical hyperglycemia as osmotic diuresis may worsen renal injury.  Meeghan Skipper K. 10/20/2015, 1:16 PM

## 2015-10-20 NOTE — Progress Notes (Signed)
Refused bed alarm. Will continue to monitor ptient.

## 2015-10-20 NOTE — Progress Notes (Signed)
ANTICOAGULATION CONSULT NOTE - Follow up  Pharmacy Consult for Heparin Indication: chest pain/ACS  No Known Allergies  Patient Measurements: Height: 5\' 7"  (170.2 cm) Weight: 147 lb 4.8 oz (66.8 kg) IBW/kg (Calculated) : 66.1 Heparin Dosing Weight: 66.8 kg  Vital Signs: Temp: 98.4 F (36.9 C) (09/16 2330) Temp Source: Oral (09/16 2330) BP: 148/76 (09/16 2330) Pulse Rate: 78 (09/16 2330)  Labs:  Recent Labs  10/18/15 0020 10/19/15 0020 10/19/15 0459 10/19/15 0857 10/19/15 1139 10/19/15 1533 10/19/15 2105 10/20/15 0041  HGB 13.7  --  12.9* 13.5  --   --   --   --   HCT 41.0  --  40.4 42.3  --   --   --   --   PLT 324  --  306 303  --   --   --   --   APTT  --  50*  --   --   --   --   --   --   LABPROT  --  13.4  --   --   --   --   --   --   INR  --  1.02  --   --   --   --   --   --   HEPARINUNFRC  --   --   --   --  0.78*  --  >2.20* 0.54  CREATININE 1.45*  --   --  1.39*  --   --   --   --   TROPONINI  --   --   --  0.30*  --  0.23* 0.23*  --     Estimated Creatinine Clearance: 52.2 mL/min (by C-G formula based on SCr of 1.39 mg/dL (H)).   Medical History: Past Medical History:  Diagnosis Date  . Anemia of chronic disease    /notes 09/06/2014  . Coronary artery disease   . HYPERKALEMIA 04/09/2009  . HYPERLIPIDEMIA 01/10/2007  . HYPERTENSION 01/10/2007  . Non-Q wave myocardial infarction [facility](HCC)    "found evidence of one when hospitalized in 06/2014"  . Palpitations 03/19/2008  . PERIPHERAL NEUROPATHY 01/10/2007  . Pneumonia 06/2014  . Proteinuria 06/07/2008  . RENAL INSUFFICIENCY, CHRONIC 06/07/2008  . Renal transplant recipient   . 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 Type II diabetes mellitus (HCC) dx'd ~ 1998    Medications:  Infusions:  . sodium chloride 10 mL/hr at 10/19/15 0845  . heparin 800 Units/hr (10/19/15 1300)  . nitroGLYCERIN 10 mcg/min (10/19/15 [id number])    Assessment:  60 y.o male with h/o kidney transplant with SOB, admitted 10/18/15 with  + troponin in ED.  Pharmacy consulted for heparin infusion protocol.  Heparin level 0.54 now in goal range.  Goal of Therapy:  Heparin level 0.3-0.7 units/ml Monitor platelets by anticoagulation protocol: Yes   Plan:  Continue IV Heparin drip at 800 units/hr Daily heparin level and CBC    Steven Riddle, PharmD, BCPS Clinical Staff Pharmacist Pager [phone]  10/20/2015,1:33 AM

## 2015-10-20 NOTE — Progress Notes (Signed)
After removing the IV of the patient to the left hand the IV  site continuously bleeding even putting a lot of pressure for a period of time.  Report given to the nurse shift nurse.

## 2015-10-20 NOTE — Progress Notes (Signed)
ANTICOAGULATION CONSULT NOTE - Follow up  Pharmacy Consult for Heparin Indication: chest pain/ACS  No Known Allergies  Patient Measurements: Height: 5\' 7"  (170.2 cm) Weight: 143 lb 6.4 oz (65 kg) (scale b) IBW/kg (Calculated) : 66.1 Heparin Dosing Weight: 66.8 kg  Vital Signs: Temp: 98.2 F (36.8 C) (09/17 0800) Temp Source: Oral (09/17 0800) BP: 144/70 (09/17 0800) Pulse Rate: 93 (09/17 0800)  Labs:  Recent Labs  10/18/15 0020 10/19/15 0020 10/19/15 0459  10/19/15 0857  10/19/15 1533 10/19/15 2105 10/20/15 0041 10/20/15 0608  HGB 13.7  --  12.9*  --  13.5  --   --   --   --  12.4*  HCT 41.0  --  40.4  --  42.3  --   --   --   --  40.0  PLT 324  --  306  --  303  --   --   --   --  300  APTT  --  50*  --   --   --   --   --   --   --   --   LABPROT  --  13.4  --   --   --   --   --   --   --   --   INR  --  1.02  --   --   --   --   --   --   --   --   HEPARINUNFRC  --   --   --   --   --   < >  --  >2.20* 0.54 0.31  CREATININE 1.45*  --   --   --  1.39*  --   --   --   --  1.46*  TROPONINI  --   --   --   < > 0.30*  --  0.23* 0.23*  --  0.23*  < > = values in this interval not displayed.  Estimated Creatinine Clearance: 48.8 mL/min (by C-G formula based on SCr of 1.46 mg/dL (H)).   Medical History: Past Medical History:  Diagnosis Date  . Anemia of chronic disease    /notes 09/06/2014  . Coronary artery disease   . HYPERKALEMIA 04/09/2009  . HYPERLIPIDEMIA 01/10/2007  . HYPERTENSION 01/10/2007  . Non-Q wave myocardial infarction [facility](HCC)    "found evidence of one when hospitalized in 06/2014"  . Palpitations 03/19/2008  . PERIPHERAL NEUROPATHY 01/10/2007  . Pneumonia 06/2014  . Proteinuria 06/07/2008  . RENAL INSUFFICIENCY, CHRONIC 06/07/2008  . Renal transplant recipient   . 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 Type II diabetes mellitus (HCC) dx'd ~ 1998    Medications:  Infusions:  . sodium chloride 10 mL/hr at 10/19/15 0845  . heparin 800 Units/hr  (10/20/15 0539)  . nitroGLYCERIN 10 mcg/min (10/19/15 [id number])    Assessment:  60 y.o male with h/o kidney transplant with SOB, admitted 10/18/15 with + troponin in ED.  Pharmacy consulted for heparin infusion protocol.  Today the heparin level is 0.31, remains therapeutic on heparin drip 800 units/hr.  CBC is stable, pltc wnl. No bleeding reported.  Cardiologist noted that Troponin I mildly elevated and trending down and denies chest pain.  Plans for cardiac cath, possible PTCA stenting,  once fully compensated.   Goal of Therapy:  Heparin level 0.3-0.7 units/ml Monitor platelets by anticoagulation protocol: Yes   Plan:  Continue IV Heparin drip at 800 units/hr Daily heparin level and CBC   Windell Mouldinguth  Chestine Spore, RPh Clinical Pharmacist Pager: [phone] 10/20/2015,12:12 PM

## 2015-10-21 LAB — CBC
HCT: 39.1 % (ref 39.0–52.0)
HEMOGLOBIN: 12.5 g/dL — AB (ref 13.0–17.0)
MCH: 27.5 pg (ref 26.0–34.0)
MCHC: 32 g/dL (ref 30.0–36.0)
MCV: 86.1 fL (ref 78.0–100.0)
PLATELETS: 290 10*3/uL (ref 150–400)
RBC: 4.54 MIL/uL (ref 4.22–5.81)
RDW: 16.3 % — ABNORMAL HIGH (ref 11.5–15.5)
WBC: 9 10*3/uL (ref 4.0–10.5)

## 2015-10-21 LAB — BASIC METABOLIC PANEL
ANION GAP: 8 (ref 5–15)
BUN: 34 mg/dL — ABNORMAL HIGH (ref 6–20)
CALCIUM: 8.6 mg/dL — AB (ref 8.9–10.3)
CO2: 23 mmol/L (ref 22–32)
Chloride: 108 mmol/L (ref 101–111)
Creatinine, Ser: 1.54 mg/dL — ABNORMAL HIGH (ref 0.61–1.24)
GFR, EST AFRICAN AMERICAN: 54 mL/min — AB (ref >60)
GFR, EST NON AFRICAN AMERICAN: 47 mL/min — AB (ref >60)
Glucose, Bld: 223 mg/dL — ABNORMAL HIGH (ref 65–99)
Potassium: 4.3 mmol/L (ref 3.5–5.1)
SODIUM: 139 mmol/L (ref 135–145)

## 2015-10-21 LAB — GLUCOSE, CAPILLARY
GLUCOSE-CAPILLARY: 125 mg/dL — AB (ref 65–99)
Glucose-Capillary: 99 mg/dL (ref 65–99)
Glucose-Capillary: 162 mg/dL — ABNORMAL HIGH (ref 65–99)
Glucose-Capillary: 221 mg/dL — ABNORMAL HIGH (ref 65–99)

## 2015-10-21 LAB — HEPARIN LEVEL (UNFRACTIONATED): HEPARIN UNFRACTIONATED: 0.32 [IU]/mL (ref 0.30–0.70)

## 2015-10-21 LAB — BRAIN NATRIURETIC PEPTIDE: B NATRIURETIC PEPTIDE 5: 2349.6 pg/mL — AB (ref 0.0–100.0)

## 2015-10-21 LAB — UREA NITROGEN, URINE: Urea Nitrogen, Ur: 200 mg/dL

## 2015-10-21 MED ORDER — SODIUM CHLORIDE 0.9 % WEIGHT BASED INFUSION
1.0000 mL/kg/h | INTRAVENOUS | Status: DC
  Administered 2015-10-21: 1 mL/kg/h via INTRAVENOUS

## 2015-10-21 MED ORDER — SODIUM CHLORIDE 0.9 % IV SOLN: 250.0000 mL | INTRAVENOUS | Status: DC | PRN

## 2015-10-21 MED ORDER — DORZOLAMIDE HCL-TIMOLOL MAL 2-0.5 % OP SOLN
1.0000 [drp] | Freq: Every day | OPHTHALMIC | Status: DC
Start: 2015-10-21 — End: 2015-10-22
  Administered 2015-10-21: 1 [drp] via OPHTHALMIC
  Filled 2015-10-21 (×2): qty 10

## 2015-10-21 MED ORDER — SODIUM CHLORIDE 0.9% FLUSH: 3.0000 mL | Freq: Two times a day (BID) | INTRAVENOUS | Status: DC

## 2015-10-21 MED ORDER — SODIUM CHLORIDE 0.9% FLUSH: 3.0000 mL | INTRAVENOUS | Status: DC | PRN

## 2015-10-21 MED ORDER — BRIMONIDINE TARTRATE 0.2 % OP SOLN
1.0000 [drp] | Freq: Every day | OPHTHALMIC | Status: DC
  Administered 2015-10-21 – 2015-10-27 (×7): 1 [drp] via OPHTHALMIC
  Filled 2015-10-21 (×4): qty 5

## 2015-10-21 MED ORDER — LATANOPROST 0.005 % OP SOLN
1.0000 [drp] | Freq: Every day | OPHTHALMIC | Status: DC
  Administered 2015-10-21 – 2015-10-27 (×7): 1 [drp] via OPHTHALMIC
  Filled 2015-10-21 (×3): qty 2.5

## 2015-10-21 NOTE — Consult Note (Signed)
   [facility] CM Inpatient Consult   10/21/2015  Steven Riddle 14-Oct-1955 [id number]   Referral received to assess for care management services.  Chart review reveals  The patient is 60 year old male with past medical history significant for coronary artery disease history of non-Q-wave myocardial infarction status post multivessel PCI in May 2016 and August 2016 refused for CABG, compensated congestive heart failure, hypertension, insulin-requiring diabetes mellitus, peripheral neuropathy, hyperlipidemia, anemia of chronic disease, depression, history of end-stage renal disease status post renal transplant, peripheral vascular disease, depression, came to the ER complaining of cough associated with progressive increasing shortness of breath and leg swelling for last 1 week. States took over-the-counter medication without relief so decided to come to ED.    Met with the patient and wife at the bedside regarding the benefits of [facility] Care Management services as a benefit of [employer] Medicare plan.   Explained that [facility] [place] is a covered benefit of insurance. Review information for [facility] Care Management and a folder was provided with contact information.  Explained that [facility] does not interfere with or replace any services arranged by the inpatient care management staff.  Patient and wife did not wish to sign up for community follow up.  Wife states, "we would like the information if we need the services."  A brochure, magnet for 24 hour nurse advise line and contact information was given. For questions, please contact:  Natividad Brood, RN BSN [facility] Liaison  [phone] business mobile phone Toll free office [phone]

## 2015-10-21 NOTE — Progress Notes (Signed)
Pharmacist Heart Failure Core Measure Documentation  Assessment: Steven Riddle has an EF documented as 40-45% on 10/20/15 by echo.  Rationale: Heart failure patients with left ventricular systolic dysfunction (LVSD) and an EF < 40% should be prescribed an angiotensin converting enzyme inhibitor (ACEI) or angiotensin receptor blocker (ARB) at discharge unless a contraindication is documented in the medical record.  This patient is not currently on an ACEI or ARB for HF.  This note is being placed in the record in order to provide documentation that a contraindication to the use of these agents is present for this encounter.  ACE Inhibitor or Angiotensin Receptor Blocker is contraindicated (specify all that apply)  []   ACEI allergy AND ARB allergy []   Angioedema []   Moderate or severe aortic stenosis []   Hyperkalemia []   Hypotension []   Renal artery stenosis [x]   Worsening renal function, preexisting renal disease or dysfunction   [name], [name] T 10/21/2015 8:23 AM

## 2015-10-21 NOTE — Progress Notes (Signed)
ANTICOAGULATION CONSULT NOTE - Follow up  Pharmacy Consult for Heparin Indication: chest pain/ACS  No Known Allergies  Patient Measurements: Height: 5\' 7"  (170.2 cm) Weight: 136 lb 3.2 oz (61.8 kg) (scale b) IBW/kg (Calculated) : 66.1 Heparin Dosing Weight: 66.8 kg  Vital Signs: Temp: 98 F (36.7 C) (09/18 0612) Temp Source: Oral (09/18 0612) BP: 136/64 (09/18 0612) Pulse Rate: 77 (09/18 0612)  Labs:  Recent Labs  10/19/15 0020  10/19/15 0857  10/19/15 1533 10/19/15 2105 10/20/15 0041 10/20/15 0608 10/21/15 0329  HGB  --   < > 13.5  --   --   --   --  12.4* 12.5*  HCT  --   < > 42.3  --   --   --   --  40.0 39.1  PLT  --   < > 303  --   --   --   --  300 290  APTT 50*  --   --   --   --   --   --   --   --   LABPROT 13.4  --   --   --   --   --   --   --   --   INR 1.02  --   --   --   --   --   --   --   --   HEPARINUNFRC  --   --   --   < >  --  >2.20* 0.54 0.31 0.32  CREATININE  --   --  1.39*  --   --   --   --  1.46* 1.54*  TROPONINI  --   < > 0.30*  --  0.23* 0.23*  --  0.23*  --   < > = values in this interval not displayed.  Estimated Creatinine Clearance: 44 mL/min (by C-G formula based on SCr of 1.54 mg/dL (H)).   Assessment:  60 y.o male with h/o kidney transplant with SOB, admitted 10/18/15 with + troponin in ED.  Pharmacy consulted for heparin infusion protocol.  Today the heparin level is 0.32, remains therapeutic on heparin drip 800 units/hr.  CBC is stable, pltc wnl. He had bleeding last night to his left hand when his IV site was removed, see RN note.  Plan for possible cardiac cath, possible PTCA vs stent on Tues 9/19.   Goal of Therapy:  Heparin level 0.3-0.7 units/ml Monitor platelets by anticoagulation protocol: Yes   Plan:  Continue IV Heparin drip at 800 units/hr Daily heparin level and CBC  Herby AbrahamMichelle T. Gwin Eagon, Pharm.D. [phone] 10/21/2015 8:27 AM

## 2015-10-21 NOTE — Progress Notes (Signed)
Inpatient Diabetes Program Recommendations  AACE/ADA: New Consensus Statement on Inpatient Glycemic Control (2015)  Target Ranges:  Prepandial:   less than 140 mg/dL      Peak postprandial:   less than 180 mg/dL (1-2 hours)      Critically ill patients:  140 - 180 mg/dL   Results for Steven Riddle, Steven Riddle (MRN [id number]) as of 10/21/2015 13:22  Ref. Range 10/20/2015 05:57 10/20/2015 12:03 10/20/2015 16:30 10/20/2015 21:42  Glucose-Capillary Latest Ref Range: 65 - 99 mg/dL 409123 (H) 811144 (H) 914254 (H) 253 (H)    Admit with: CHF  History: DM, CKD, Kidney Transplant  Home DM Meds: Levemir 25 units bid  Current Insulin Orders: Levemir 10 units bid      Novolog Sensitive Correction Scale/ SSI (0-9 units) TID AC     MD- Note patient having elevated postprandial glucose levels.  Eating 75-100% of meals per documentation.  Please consider starting low dose Novolog Meal Coverage: Novolog 2 units tid with meals (hold if pt eats <50% of meal)      --Will follow patient during hospitalization--  Ambrose FinlandJeannine Johnston Kmya Placide RN, MSN, CDE Diabetes Coordinator Inpatient Glycemic Control Team Team Pager: [phone] (8a-5p)

## 2015-10-21 NOTE — Progress Notes (Signed)
[facility] Rounding Note  Subjective/Interval History:  Says he feels fine No more SOB, no CP Swelling has completely resolved  Objective Vital signs in last 24 hours: Vitals:   10/20/15 2048 10/21/15 0044 10/21/15 0612 10/21/15 1138  BP: (!) 147/69 (!) 148/76 136/64 136/67  Pulse: 85 81 77 74  Resp: 18 16 18 18   Temp: 98.1 F (36.7 C) 98.7 F (37.1 C) 98 F (36.7 C) 98 F (36.7 C)  TempSrc: Oral Oral Oral Oral  SpO2: 95% 95% 95% 97%  Weight:   61.8 kg (136 lb 3.2 oz)   Height:       Weight change: -3.266 kg (-7 lb 3.2 oz)  Intake/Output Summary (Last 24 hours) at 10/21/15 1210 Last data filed at 10/21/15 1137  Gross per 24 hour  Intake              720 ml  Output             1475 ml  Net             -755 ml   Physical Exam:  Blood pressure 136/67, pulse 74, temperature 98 F (36.7 C), temperature source Oral, resp. rate 18, height 5\' 7"  (1.702 m), weight 61.8 kg (136 lb 3.2 oz), SpO2 97 %.   Very pleasant, soft spoken Lying in bed, no O2 requirement No JVD Lungs clear without crackles or wheezes Cardiac rhythm regular, normal S1S2 No S3 or S4 Abdomen non distended. + BS. RLQ renal allograft firm and nontender No edema whatsoever of the LE's. He is oriented to person, place, and time. He appears well-developed and well-nourished. No distress.  Right upper arm AVF with + bruit and thrill   Labs:  Recent Labs Lab 10/18/15 0020 10/19/15 0857 10/20/15 0608 10/21/15 0329  NA 140 142 141 139  K 5.3* 4.4 4.3 4.3  CL 109 109 109 108  CO2 24 22 21* 23  GLUCOSE 171* 87 100* 223*  BUN 44* 39* 36* 34*  CREATININE 1.45* 1.39* 1.46* 1.54*  CALCIUM 9.1 9.2 8.7* 8.6*     Recent Labs Lab 10/19/15 0857  AST 27  ALT 27  ALKPHOS 103  BILITOT 0.9  PROT 7.1  ALBUMIN 2.7*     Recent Labs Lab 10/18/15 0020 10/19/15 0459 10/19/15 0857 10/20/15 0608 10/21/15 0329  WBC 9.1 8.8 10.1 9.0 9.0  NEUTROABS 6.6  --  6.5  --   --   HGB 13.7 12.9* 13.5 12.4* 12.5*  HCT 41.0  40.4 42.3 40.0 39.1  MCV 84.4 86.3 85.6 86.8 86.1  PLT 324 306 303 300 290     Recent Labs Lab 10/19/15 0857 10/19/15 1533 10/19/15 2105 10/20/15 0608  TROPONINI 0.30* 0.23* 0.23* 0.23*     Recent Labs Lab 10/20/15 1203 10/20/15 1630 10/20/15 2142 10/21/15 0755 10/21/15 1140  GLUCAP 144* 254* 253* 99 162*   Medications: . sodium chloride 10 mL/hr at 10/19/15 0845  . heparin 800 Units/hr (10/20/15 0539)  . nitroGLYCERIN 10 mcg/min (10/19/15 0952)   . aspirin EC  81 mg Oral Daily  . brimonidine  1 drop Left Eye QHS  . carvedilol  3.125 mg Oral BID  . cycloSPORINE  100 mg Oral BID  . dorzolamide-timolol  1 drop Left Eye QHS  . fluconazole  150 mg Oral Daily  . insulin aspart  0-9 Units Subcutaneous TID WC  . insulin detemir  10 Units Subcutaneous BID  . latanoprost  1 drop Left Eye QHS  .  mycophenolate  180 mg Oral QPM  . pantoprazole  40 mg Oral Q0600  . prasugrel  10 mg Oral Daily  . predniSONE  5 mg Oral Daily  . rosuvastatin  10 mg Oral Daily    I  have reviewed scheduled and prn medications.  Background: 60 year old man of [place] origin with HTN, DM, CAD (s/p multivessel PCI 2016 and prior refusal of CABG), HLD and peripheral neuropathy.  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 Last creatinine noted was 1.1 in June, 2017 and follows with Dr. Annie Sable.  Admitted with SOB, LE edema. With diuresis edema and SOB have resolved.   We were consulted to follow along his renal function after the development of AKI on CKD with creatinine of 1.45 after diuretic therapy) in anticipation of coronary angiogram on Tuesday 9/19.  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  We cannot give him precontrast exposure intravenous fluids because of his presentation with volume overload/CHF exacerbation and the best strategy would be for minimization of contrast volume and leaving him off diuretics for at least 24 hours before the procedure (hence the recommendation to stop furosemide after his dose this AM).  Avoid hypotension and of course nonsteroidals.  Reference:    http://www.renalguard.com/educational/contrast-induced-nephropathy-risk-calculator  CHF exacerbation Clinically CHF has resolved, as has his edema with diuresis It appears that he was Lasix nave prior to admission. LVEF 40-45% by ECHO 9/17 with mild MR  CAD with prior multivessel PCI and refusal of CABG in the past Evaluated for ACS and by EKG found to have probable small non-Q-wave MI. Plans in place for coronary angiogram tomorrow On IV heparin and IV nitro  Hypertension:  Blood pressure is well controlled at this time Continue to avoid hypotension.   Insulin-dependent diabetes mellitus:  Continue optimization of diabetes control while avoiding hypoglycemic events/critical hyperglycemia as osmotic diuresis may worsen renal injury.  Camille Bal, MD [facility] [phone] Pager 10/21/2015, 12:27 PM

## 2015-10-21 NOTE — Progress Notes (Signed)
Subjective: Appreciate renal service consult and Denies any chest pain or shortness of breath  leg swelling completely resolved.  Objective:  Vital Signs in the last 24 hours: Temp:  [97.7 F (36.5 C)-98.7 F (37.1 C)] 98 F (36.7 C) (09/18 0612) Pulse Rate:  [75-85] 77 (09/18 0612) Resp:  [16-18] 18 (09/18 0612) BP: (127-148)/(64-76) 136/64 (09/18 0612) SpO2:  [95 %-97 %] 95 % (09/18 0612) Weight:  [136 lb 3.2 oz (61.8 kg)] 136 lb 3.2 oz (61.8 kg) (09/18 0612)  Intake/Output from previous day: 09/17 0701 - 09/18 0700 In: 720 [P.O.:720] Out: 1475 [Urine:1475] Intake/Output from this shift: Total I/O In: 240 [P.O.:240] Out: -   Physical Exam: Neck: no adenopathy, no carotid bruit, no JVD and supple, symmetrical, trachea midline Lungs: Decreased breath sound at bases Heart: regular rate and rhythm, S1, S2 normal and Soft systolic murmur noted Abdomen: soft, non-tender; bowel sounds normal; no masses,  no organomegaly Extremities: extremities normal, atraumatic, no cyanosis or edema  Lab Results:  Recent Labs  10/20/15 0608 10/21/15 0329  WBC 9.0 9.0  HGB 12.4* 12.5*  PLT 300 290    Recent Labs  10/20/15 0608 10/21/15 0329  NA 141 139  K 4.3 4.3  CL 109 108  CO2 21* 23  GLUCOSE 100* 223*  BUN 36* 34*  CREATININE 1.46* 1.54*    Recent Labs  10/19/15 2105 10/20/15 0608  TROPONINI 0.23* 0.23*   Hepatic Function Panel  Recent Labs  10/19/15 0857  PROT 7.1  ALBUMIN 2.7*  AST 27  ALT 27  ALKPHOS 103  BILITOT 0.9    Recent Labs  10/20/15 0608  CHOL 136   No results for input(s): PROTIME in the last 72 hours.  Imaging: Imaging results have been reviewed and No results found.  Cardiac Studies:  Assessment/P Compensated  systolic heart failure Probable small non-Q-wave myocardial infarction Multivessel CAD status post multivessel PCI in the past Hypertension Diabetes mellitus Hyperlipidemia History of end-stage renal disease status post  renal transplant Chronic kidney diseaseStage II Anemia of chronic disease Peripheral neuropathy Peripheral vascular disease Depression Plan Hold Lasix today Discussed with patient at length regarding left cardiac catheterization possible PTCA stenting its risk and benefits i.e. death MI stroke need for emergency CABG local vascular complications worsening renal function, staging the procedure to avoid large bolus of contrast at 1 setting etc. and consents for PCI.  LOS: 2 days    Shaterra Sanzone 10/21/2015, 10:30 AM    

## 2015-10-22 ENCOUNTER — Encounter (HOSPITAL_COMMUNITY)
Admission: EM | Disposition: A | Discharge status: Home / Self Care | Payer: Self-pay | Source: Home / Self Care | Attending: Cardiology

## 2015-10-22 ENCOUNTER — Other Ambulatory Visit: Payer: Self-pay | Admitting: *Deleted

## 2015-10-22 ENCOUNTER — Encounter (HOSPITAL_COMMUNITY): Payer: Self-pay | Admitting: Cardiology

## 2015-10-22 DIAGNOSIS — I25110 Atherosclerotic heart disease of native coronary artery with unstable angina pectoris: Secondary | ICD-10-CM

## 2015-10-22 DIAGNOSIS — I2510 Atherosclerotic heart disease of native coronary artery without angina pectoris: Secondary | ICD-10-CM

## 2015-10-22 HISTORY — PX: CARDIAC CATHETERIZATION: SHX172

## 2015-10-22 LAB — CBC
HEMATOCRIT: 39.8 % (ref 39.0–52.0)
HEMOGLOBIN: 12.3 g/dL — AB (ref 13.0–17.0)
MCH: 27 pg (ref 26.0–34.0)
MCHC: 30.9 g/dL (ref 30.0–36.0)
MCV: 87.5 fL (ref 78.0–100.0)
Platelets: 268 10*3/uL (ref 150–400)
RBC: 4.55 MIL/uL (ref 4.22–5.81)
RDW: 16.5 % — ABNORMAL HIGH (ref 11.5–15.5)
WBC: 7.9 10*3/uL (ref 4.0–10.5)

## 2015-10-22 LAB — BASIC METABOLIC PANEL
ANION GAP: 6 (ref 5–15)
BUN: 29 mg/dL — ABNORMAL HIGH (ref 6–20)
CHLORIDE: 113 mmol/L — AB (ref 101–111)
CO2: 22 mmol/L (ref 22–32)
Calcium: 8.4 mg/dL — ABNORMAL LOW (ref 8.9–10.3)
Creatinine, Ser: 1.43 mg/dL — ABNORMAL HIGH (ref 0.61–1.24)
GFR calc Af Amer: 60 mL/min — ABNORMAL LOW (ref >60)
GFR, EST NON AFRICAN AMERICAN: 51 mL/min — AB (ref >60)
Glucose, Bld: 162 mg/dL — ABNORMAL HIGH (ref 65–99)
POTASSIUM: 4.6 mmol/L (ref 3.5–5.1)
SODIUM: 141 mmol/L (ref 135–145)

## 2015-10-22 LAB — GLUCOSE, CAPILLARY
GLUCOSE-CAPILLARY: 110 mg/dL — AB (ref 65–99)
GLUCOSE-CAPILLARY: 129 mg/dL — AB (ref 65–99)
GLUCOSE-CAPILLARY: 162 mg/dL — AB (ref 65–99)
GLUCOSE-CAPILLARY: 241 mg/dL — AB (ref 65–99)
Glucose-Capillary: 59 mg/dL — ABNORMAL LOW (ref 65–99)

## 2015-10-22 LAB — HEMOGLOBIN A1C
Hgb A1c MFr Bld: 12.8 % — ABNORMAL HIGH (ref 4.8–5.6)
Mean Plasma Glucose: 321 mg/dL

## 2015-10-22 LAB — POCT ACTIVATED CLOTTING TIME: Activated Clotting Time: 164 seconds

## 2015-10-22 LAB — HEPARIN LEVEL (UNFRACTIONATED): HEPARIN UNFRACTIONATED: 0.43 [IU]/mL (ref 0.30–0.70)

## 2015-10-22 LAB — CYCLOSPORINE: Cyclosporine, LabCorp: 722 ng/mL — ABNORMAL HIGH (ref 100–400)

## 2015-10-22 SURGERY — LEFT HEART CATH AND CORONARY ANGIOGRAPHY

## 2015-10-22 MED ORDER — KIDNEY FAILURE BOOK
Freq: Once | Status: AC
  Administered 2015-10-22: 16:00:00
  Filled 2015-10-22: qty 1

## 2015-10-22 MED ORDER — FENTANYL CITRATE (PF) 100 MCG/2ML IJ SOLN
INTRAMUSCULAR | Status: DC | PRN
  Administered 2015-10-22: 25 ug via INTRAVENOUS

## 2015-10-22 MED ORDER — SODIUM CHLORIDE 0.9% FLUSH: 3.0000 mL | INTRAVENOUS | Status: DC | PRN

## 2015-10-22 MED ORDER — SODIUM CHLORIDE 0.9% FLUSH
3.0000 mL | Freq: Two times a day (BID) | INTRAVENOUS | Status: DC
  Administered 2015-10-22 – 2015-10-27 (×10): 3 mL via INTRAVENOUS

## 2015-10-22 MED ORDER — LIDOCAINE HCL (PF) 1 % IJ SOLN
INTRAMUSCULAR | Status: DC | PRN
  Administered 2015-10-22: 20 mL

## 2015-10-22 MED ORDER — FENTANYL CITRATE (PF) 100 MCG/2ML IJ SOLN
INTRAMUSCULAR | Status: AC
  Filled 2015-10-22: qty 2

## 2015-10-22 MED ORDER — LIDOCAINE HCL (PF) 1 % IJ SOLN
INTRAMUSCULAR | Status: AC
  Filled 2015-10-22: qty 30

## 2015-10-22 MED ORDER — SODIUM CHLORIDE 0.9 % WEIGHT BASED INFUSION: 1.0000 mL/kg/h | INTRAVENOUS | Status: DC

## 2015-10-22 MED ORDER — IOPAMIDOL (ISOVUE-370) INJECTION 76%
INTRAVENOUS | Status: DC | PRN
  Administered 2015-10-22: 22 mL via INTRA_ARTERIAL

## 2015-10-22 MED ORDER — ACTIVE PARTNERSHIP FOR HEALTH OF YOUR HEART BOOK
Freq: Once | Status: AC
  Administered 2015-10-22: 16:00:00
  Filled 2015-10-22: qty 1

## 2015-10-22 MED ORDER — MIDAZOLAM HCL 2 MG/2ML IJ SOLN
INTRAMUSCULAR | Status: DC | PRN
  Administered 2015-10-22: 1 mg via INTRAVENOUS

## 2015-10-22 MED ORDER — ~~LOC~~ CARDIAC SURGERY, PATIENT & FAMILY EDUCATION
Freq: Once | Status: AC
Start: 2015-10-22 — End: 2015-10-22
  Administered 2015-10-22: 16:00:00
  Filled 2015-10-22: qty 1

## 2015-10-22 MED ORDER — HEPARIN (PORCINE) IN NACL 2-0.9 UNIT/ML-% IJ SOLN
INTRAMUSCULAR | Status: DC | PRN
  Administered 2015-10-22: 1000 mL

## 2015-10-22 MED ORDER — LIVING WELL WITH DIABETES BOOK
Freq: Once | Status: AC
  Administered 2015-10-22: 16:00:00
  Filled 2015-10-22: qty 1

## 2015-10-22 MED ORDER — DEXTROSE 50 % IV SOLN
1.0000 | Freq: Once | INTRAVENOUS | Status: AC
  Administered 2015-10-22: 25 mL via INTRAVENOUS

## 2015-10-22 MED ORDER — MIDAZOLAM HCL 2 MG/2ML IJ SOLN
INTRAMUSCULAR | Status: AC
  Filled 2015-10-22: qty 2

## 2015-10-22 MED ORDER — IOPAMIDOL (ISOVUE-370) INJECTION 76%
INTRAVENOUS | Status: AC
  Filled 2015-10-22: qty 100

## 2015-10-22 MED ORDER — EXERCISE FOR HEART AND HEALTH BOOK
Freq: Once | Status: AC
  Administered 2015-10-22: 16:00:00
  Filled 2015-10-22: qty 1

## 2015-10-22 MED ORDER — TIMOLOL MALEATE 0.5 % OP SOLN
1.0000 [drp] | Freq: Every day | OPHTHALMIC | Status: DC
  Administered 2015-10-23 – 2015-10-27 (×5): 1 [drp] via OPHTHALMIC
  Filled 2015-10-22: qty 5

## 2015-10-22 MED ORDER — DORZOLAMIDE HCL 2 % OP SOLN
1.0000 [drp] | Freq: Every day | OPHTHALMIC | Status: DC
  Administered 2015-10-23 – 2015-10-27 (×5): 1 [drp] via OPHTHALMIC
  Filled 2015-10-22 (×2): qty 10

## 2015-10-22 MED ORDER — SODIUM CHLORIDE 0.9 % IV SOLN: 250.0000 mL | INTRAVENOUS | Status: DC | PRN

## 2015-10-22 MED ORDER — HEPARIN (PORCINE) IN NACL 2-0.9 UNIT/ML-% IJ SOLN
INTRAMUSCULAR | Status: AC
  Filled 2015-10-22: qty 1000

## 2015-10-22 MED ORDER — DEXTROSE 50 % IV SOLN
INTRAVENOUS | Status: AC
Start: 2015-10-22 — End: 2015-10-22
  Filled 2015-10-22: qty 50

## 2015-10-22 MED ORDER — MOVING RIGHT ALONG BOOK
Freq: Once | Status: AC
Start: 2015-10-22 — End: 2015-10-22
  Administered 2015-10-22: 16:00:00
  Filled 2015-10-22: qty 1

## 2015-10-22 MED ORDER — HEPARIN (PORCINE) IN NACL 100-0.45 UNIT/ML-% IJ SOLN: 800.0000 [IU]/h | INTRAMUSCULAR | Status: DC

## 2015-10-22 SURGICAL SUPPLY — 8 items
CATH IMPULSE 5F ANG/FL3.5 (CATHETERS) ×2 IMPLANT
CATH INFINITI 5FR AL1 (CATHETERS) ×2 IMPLANT
KIT HEART LEFT (KITS) ×3 IMPLANT
PACK CARDIAC CATHETERIZATION (CUSTOM PROCEDURE TRAY) ×3 IMPLANT
SHEATH PINNACLE 5F 10CM (SHEATH) ×2 IMPLANT
SYR MEDRAD MARK V 150ML (SYRINGE) ×3 IMPLANT
TRANSDUCER W/STOPCOCK (MISCELLANEOUS) ×3 IMPLANT
WIRE EMERALD 3MM-J .035X150CM (WIRE) ×2 IMPLANT

## 2015-10-22 NOTE — Progress Notes (Signed)
ANTICOAGULATION CONSULT NOTE - Follow up  Pharmacy Consult for Heparin Indication: chest pain/ACS  No Known Allergies  Patient Measurements: Height: 5\' 7"  (170.2 cm) Weight: 140 lb 14.4 oz (63.9 kg) (scale b) IBW/kg (Calculated) : 66.1 Heparin Dosing Weight: 66.8 kg  Vital Signs: Temp: 98.1 F (36.7 C) (09/19 0659) Temp Source: Oral (09/19 0659) BP: 133/57 (09/19 0659) Pulse Rate: 74 (09/19 0659)  Labs:  Recent Labs  10/19/15 0857  10/19/15 1533 10/19/15 2105  10/20/15 0608 10/21/15 0329 10/22/15 0604  HGB 13.5  --   --   --   --  12.4* 12.5* 12.3*  HCT 42.3  --   --   --   --  40.0 39.1 39.8  PLT 303  --   --   --   --  300 290 268  HEPARINUNFRC  --   < >  --  >2.20*  < > 0.31 0.32 0.43  CREATININE 1.39*  --   --   --   --  1.46* 1.54*  --   TROPONINI 0.30*  --  0.23* 0.23*  --  0.23*  --   --   < > = values in this interval not displayed.  Estimated Creatinine Clearance: 45.5 mL/min (by C-G formula based on SCr of 1.54 mg/dL (H)).   Assessment:  60 y.o male with h/o kidney transplant with SOB, admitted 10/18/15 with + troponin in ED.  Pharmacy consulted for heparin infusion protocol.  Today the heparin level is 0.43, remains therapeutic on heparin drip 800 units/hr.  CBC is stable, pltc wnl. He had bleeding the night of 9/17 to his left hand when his IV site was removed, see RN note.  Plan for possible cardiac cath, possible PTCA vs stent on Tues 9/19.   Goal of Therapy:  Heparin level 0.3-0.7 units/ml Monitor platelets by anticoagulation protocol: Yes   Plan:  -Continue IV Heparin drip at 800 units/hr -Daily heparin level and CBC -F/u heparin restart post-cath  Gwyndolyn KaufmanKai Arya Luttrull Bernette Redbird(Kenny), PharmD  PGY1 Pharmacy Resident Pager: [phone] 10/22/2015 7:23 AM

## 2015-10-22 NOTE — Care Management Note (Signed)
Case Management Note  Patient Details  Name: Steven Riddle MRN: [id number] Date of Birth: [birth year]  Subjective/Objective:    S/p heart cath, NCM will cont to follow for dc needs.                Action/Plan:   Expected Discharge Date:                  Expected Discharge Plan:  Home/Self Care  In-House Referral:     Discharge planning Services  CM Consult  Post Acute Care Choice:    Choice offered to:     DME Arranged:    DME Agency:     HH Arranged:    HH Agency:     Status of Service:  In process, will continue to follow  If discussed at Long Length of Stay Meetings, dates discussed:    Additional Comments:  Leone Havenaylor, Jonne Rote Clinton, RN 10/22/2015, 3:02 PM

## 2015-10-22 NOTE — H&P (View-Only) (Signed)
Subjective: Appreciate renal service consult and Denies any chest pain or shortness of breath  leg swelling completely resolved.  Objective:  Vital Signs in the last 24 hours: Temp:  [97.7 F (36.5 C)-98.7 F (37.1 C)] 98 F (36.7 C) (09/18 0612) Pulse Rate:  [75-85] 77 (09/18 0612) Resp:  [16-18] 18 (09/18 0612) BP: (127-148)/(64-76) 136/64 (09/18 0612) SpO2:  [95 %-97 %] 95 % (09/18 0612) Weight:  [136 lb 3.2 oz (61.8 kg)] 136 lb 3.2 oz (61.8 kg) (09/18 0612)  Intake/Output from previous day: 09/17 0701 - 09/18 0700 In: 720 [P.O.:720] Out: 1475 [Urine:1475] Intake/Output from this shift: Total I/O In: 240 [P.O.:240] Out: -   Physical Exam: Neck: no adenopathy, no carotid bruit, no JVD and supple, symmetrical, trachea midline Lungs: Decreased breath sound at bases Heart: regular rate and rhythm, S1, S2 normal and Soft systolic murmur noted Abdomen: soft, non-tender; bowel sounds normal; no masses,  no organomegaly Extremities: extremities normal, atraumatic, no cyanosis or edema  Lab Results:  Recent Labs  10/20/15 0608 10/21/15 0329  WBC 9.0 9.0  HGB 12.4* 12.5*  PLT 300 290    Recent Labs  10/20/15 0608 10/21/15 0329  NA 141 139  K 4.3 4.3  CL 109 108  CO2 21* 23  GLUCOSE 100* 223*  BUN 36* 34*  CREATININE 1.46* 1.54*    Recent Labs  10/19/15 2105 10/20/15 0608  TROPONINI 0.23* 0.23*   Hepatic Function Panel  Recent Labs  10/19/15 0857  PROT 7.1  ALBUMIN 2.7*  AST 27  ALT 27  ALKPHOS 103  BILITOT 0.9    Recent Labs  10/20/15 0608  CHOL 136   No results for input(s): PROTIME in the last 72 hours.  Imaging: Imaging results have been reviewed and No results found.  Cardiac Studies:  Assessment/P Compensated  systolic heart failure Probable small non-Q-wave myocardial infarction Multivessel CAD status post multivessel PCI in the past Hypertension Diabetes mellitus Hyperlipidemia History of end-stage renal disease status post  renal transplant Chronic kidney diseaseStage II Anemia of chronic disease Peripheral neuropathy Peripheral vascular disease Depression Plan Hold Lasix today Discussed with patient at length regarding left cardiac catheterization possible PTCA stenting its risk and benefits i.e. death MI stroke need for emergency CABG local vascular complications worsening renal function, staging the procedure to avoid large bolus of contrast at 1 setting etc. and consents for PCI.  LOS: 2 days    [name], Suman Trivedi 10/21/2015, 10:30 AM

## 2015-10-22 NOTE — Progress Notes (Addendum)
[facility] Rounding Note  Subjective/Interval History:  Had cath today Plan is for surgical consult.readdress CABG - says "I need one now" Denies CP or SOB  Objective Vital signs in last 24 hours: Vitals:   10/22/15 1120 10/22/15 1125 10/22/15 1130 10/22/15 1140  BP: (!) 181/80 (!) 180/78 (!) 175/80 (!) 180/76  Pulse: 75 74 73 74  Resp: 20 17 19 20   Temp:      TempSrc:      SpO2: 96% 96% 97% 96%  Weight:      Height:       Weight change: 2.132 kg (4 lb 11.2 oz)  Intake/Output Summary (Last 24 hours) at 10/22/15 1234 Last data filed at 10/22/15 0915  Gross per 24 hour  Intake             1311 ml  Output             1150 ml  Net              161 ml   Physical Exam:  Blood pressure (!) 180/76, pulse 74, temperature 97.8 F (36.6 C), temperature source Oral, resp. rate 20, height 5\' 7"  (1.702 m), weight 63.9 kg (140 lb 14.4 oz), SpO2 96 %.   Very pleasant, soft spoken Lying in bed No JVD Lungs clear Cardiac rhythm regular, normal S1S2 No S3 or S4 Abdomen non distended. + BS.  RLQ renal allograft firm and nontender No edema whatsoever of the LE's. He is oriented to person, place, and time. Right upper arm AVF with + bruit and thrill R groin with dressing, no hematoma   Labs:  Recent Labs Lab 10/18/15 0020 10/19/15 0857 10/20/15 0608 10/21/15 0329 10/22/15 0604  NA 140 142 141 139 141  K 5.3* 4.4 4.3 4.3 4.6  CL 109 109 109 108 113*  CO2 24 22 21* 23 22  GLUCOSE 171* 87 100* 223* 162*  BUN 44* 39* 36* 34* 29*  CREATININE 1.45* 1.39* 1.46* 1.54* 1.43*  CALCIUM 9.1 9.2 8.7* 8.6* 8.4*     Recent Labs Lab 10/19/15 0857  AST 27  ALT 27  ALKPHOS 103  BILITOT 0.9  PROT 7.1  ALBUMIN 2.7*     Recent Labs Lab 10/18/15 0020  10/19/15 0857 10/20/15 0608 10/21/15 0329 10/22/15 0604  WBC 9.1  < > 10.1 9.0 9.0 7.9  NEUTROABS 6.6  --  6.5  --   --   --   HGB 13.7  < > 13.5 12.4* 12.5* 12.3*  HCT 41.0  < > 42.3 40.0 39.1 39.8  MCV 84.4  < > 85.6 86.8 86.1 87.5   PLT 324  < > 303 300 290 268  < > = values in this interval not displayed.   Recent Labs Lab 10/19/15 0857 10/19/15 1533 10/19/15 2105 10/20/15 0608  TROPONINI 0.30* 0.23* 0.23* 0.23*     Recent Labs Lab 10/21/15 0755 10/21/15 1140 10/21/15 1637 10/21/15 2230 10/22/15 0746  GLUCAP 99 162* 221* 125* 110*   Results for Steven Riddle, Steven Riddle (MRN [id number]) as of 10/22/2015 13:02  Ref. Range 10/21/2015 03:29  Cyclosporine, [facility] Latest Ref Range: 100 - 400 ng/mL 722 (H)    Medications:  sodium chloride 999 mL/hr (10/22/15 0959)   sodium chloride 1 mL/kg/hr (10/22/15 1151)   heparin Stopped (10/22/15 0915)   nitroGLYCERIN 10 mcg/min (10/21/15 1231)    aspirin EC  81 mg Oral Daily   brimonidine  1 drop Left Eye QHS   carvedilol  3.125 mg  Oral BID   cycloSPORINE  100 mg Oral BID   dorzolamide-timolol  1 drop Left Eye QHS   fluconazole  150 mg Oral Daily   insulin aspart  0-9 Units Subcutaneous TID WC   insulin detemir  10 Units Subcutaneous BID   latanoprost  1 drop Left Eye QHS   mycophenolate  180 mg Oral QPM   pantoprazole  40 mg Oral Q0600   predniSONE  5 mg Oral Daily   rosuvastatin  10 mg Oral Daily   sodium chloride flush  3 mL Intravenous Q12H   Coronary Angio findings: (10/22/15) LVEDP was 25 mmHg Left main is 60-70% distal stenosis LAD has 90-95% ostial in-stent restenosis Diagonal 1 stent is patent Left circumflex as 95% proximal in-stent restenosis High OM1 is patent RCA has 30-40% mid in-stent restenosis distally the vessel is small and diffusely diseased as before.  Patient was transferred to recovery room in stable condition.  Per Dr. Sharyn LullHarwani "We will get CVTS consult and discuss with family again regarding CABG".    Background: 60 year old man of [place] origin with HTN, DM, CAD (s/p multivessel PCI 2016 and prior refusal of CABG), HLD and peripheral neuropathy.  Has a history of ESRD from his DM and HTN (never required dialysis  but has RUE AVF) and underwent LURD kidney transplant in [place] in July, 2013 with uneventful course except for episodes of CMV/BK viremia treated with adjustment of immunosuppression. Last creatinine noted was 1.1 in June, 2017 and follows with Dr. Annie SableKellie Goldsborough.  Admitted with SOB, LE edema. With diuresis,  edema and SOB have resolved.   We were consulted to follow along his renal function after the development of AKI on CKD with creatinine of 1.45 after diuretic therapy) in anticipation of coronary angiogram on Tuesday 9/19.  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:  Hemodynamically mediated with CHF exacerbation/diuresis Creatinine remains above his usual baseline of 1.1 Lytes are OK Lasix is currently being held (last dose 9/17) Risk of contrast-induced nephropathy estimated at about 25% (assuming contrast volume <13500mL) and risk for needing dialysis because of that is about 1% based on the CIN risk calculator - all reviewed with pt pre cath (Reference:http://www.renalguard.com/educational/contrast-induced-nephropathy-risk-calculator Creatinine pre cath 1.43 (9/18) Cyclosporine level 722 - drawn at 3:29 in the morning - looks like CyA was given around 11PM then level drawn 4.5 hours later so clearly not a trough Will try to get a true trough level - discussed with pharmacy  CHF exacerbation Clinically CHF has resolved, as has his edema with diuresis Lasix nave prior to admission. LVEF 40-45% by ECHO 9/17 with mild MR  CAD with prior multivessel PCI and refusal of CABG in the past Evaluated for ACS and by EKG found to have probable small non-Q-wave MI. Cath results noted and plan is for surgical consult readdress possible CABG - he and family all willing. On  IV nitro  Hypertension:  Controlled  Continue to avoid hypotension.   Insulin-dependent diabetes mellitus:  Fair control  Camille Balynthia Pedro Oldenburg, MD [facility] [phone] Pager 10/22/2015, 12:34 PM

## 2015-10-22 NOTE — Progress Notes (Signed)
Patient & wife refused bed alarm. Will continue to monitor.

## 2015-10-22 NOTE — Progress Notes (Unsigned)
spiro

## 2015-10-22 NOTE — Interval H&P Note (Signed)
Cath Lab Visit (complete for each Cath Lab visit)  Clinical Evaluation Leading to the Procedure:   ACS: Yes.    Non-ACS:    Anginal Classification: CCS III  Anti-ischemic medical therapy: Maximal Therapy (2 or more classes of medications)  Non-Invasive Test Results: No non-invasive testing performed  Prior CABG: No previous CABG      History and Physical Interval Note:  10/22/2015 9:57 AM  Steven Riddle  has presented today for surgery, with the diagnosis of cp  The various methods of treatment have been discussed with the patient and family. After consideration of risks, benefits and other options for treatment, the patient has consented to  Procedure(s): Left Heart Cath and Coronary Angiography (N/A) as a surgical intervention .  The patient's history has been reviewed, patient examined, no change in status, stable for surgery.  I have reviewed the patient's chart and labs.  Questions were answered to the patient's satisfaction.     [name], Gurveer Colucci

## 2015-10-22 NOTE — Progress Notes (Signed)
Site area: RFA Site Prior to Removal:  Level 0 Pressure Applied For:2820min Manual: yes   Patient Status During Pull:stable   Post Pull Site:  Level 0 Post Pull Instructions Given: yes  Post Pull Pulses Present: doppler Dressing Applied:  tegaderm Bedrest begins @1140  till 1540  Comments:by Christi Austin/canderson

## 2015-10-22 NOTE — Care Management Important Message (Signed)
Important Message  Patient Details  Name: Steven Riddle MRN: [id number] Date of Birth: October 13, 1955   Medicare Important Message Given:  Yes    Isa Kohlenberg Stefan ChurchBratton 10/22/2015, 10:24 AM

## 2015-10-22 NOTE — Progress Notes (Signed)
ANTICOAGULATION CONSULT NOTE - Follow up  Pharmacy Consult for Heparin Indication: chest pain/ACS  No Known Allergies  Patient Measurements: Height: 5\' 7"  (170.2 cm) Weight: 140 lb 14.4 oz (63.9 kg) (scale b) IBW/kg (Calculated) : 66.1 Heparin Dosing Weight: 63.9 kg  Vital Signs: Temp: 97.6 F (36.4 C) (09/19 1525) Temp Source: Oral (09/19 1525) BP: 156/67 (09/19 1525) Pulse Rate: 75 (09/19 1525)  Labs:  Recent Labs  10/19/15 2105  10/20/15 0608 10/21/15 0329 10/22/15 0604  HGB  --   < > 12.4* 12.5* 12.3*  HCT  --   --  40.0 39.1 39.8  PLT  --   --  300 290 268  HEPARINUNFRC >2.20*  < > 0.31 0.32 0.43  CREATININE  --   --  1.46* 1.54* 1.43*  TROPONINI 0.23*  --  0.23*  --   --   < > = values in this interval not displayed.  Estimated Creatinine Clearance: 49 mL/min (by C-G formula based on SCr of 1.43 mg/dL (H)).   Assessment:  60 y.o male with h/o kidney transplant with SOB, admitted 10/18/15 with + troponin in ED.  Pharmacy consulted for heparin infusion protocol. Patient underwent cardiac catheterization earlier today. CBC is stable, pltc wnl. Patient and family have agreed to CABG.  Prior therapeutic levels on heparin drip 800 units/hr.   Goal of Therapy:  Heparin level 0.3-0.7 units/ml Monitor platelets by anticoagulation protocol: Yes   Plan:  -Restart IV Heparin drip at 800 units/hr -Confirm Heparin Level in the morning -Daily heparin level and CBC  Ruben Imony Reef Achterberg, PharmD Clinical Pharmacist Pager: [phone] 10/22/2015 7:38 PM

## 2015-10-22 NOTE — Consult Note (Signed)
Steven Riddle       [place] [address]             [phone]      Cardiothoracic Surgery Consultation  Reason for Consult: Severe left main and multi-vessel coronary artery disease Referring Physician: Dr. Delorise Royals is an 60 y.o. male.  HPI:   The patient is a 60 year old [place] gentleman with a history of poorly controlled DM with peripheral neuropathy and ESRD, hypertension, s/p renal transplant as well as severe multi-vessel coronary artery disease s/p PCI/stenting of the LCX  on 06/04/2014, stenting of the LAD/D on 06/07/2014, stenting of the RCA on 09/06/2014. He reports a two week history of progressive shortness of breath, cough and leg edema. He denies any chest pain or pressure. ECG on presentation showed NSR with old AS MI and RBBB. Troponin was minimally elevated. An echo on 9/17 showed an LVEF of 40-45% with diffuse hypokinesis. There was mild AS with a mean gradient of 10 mm Hg, peak of 19 mm Hg. The peak velocity ratio was 0.37 and the indexed valve area 0.54 cm2/m2. Cardiac cath today showed 70% ostial LM stenosis, 90% ostial LAD, 95% ostial and proximal LCX.  The patient lives at home with his family. He is not active at all and says that he does not walk much due to difficulty with balance.  Past Medical History:  Diagnosis Date  . Anemia of chronic disease    /notes 09/06/2014  . Coronary artery disease   . HYPERKALEMIA 04/09/2009  . HYPERLIPIDEMIA 01/10/2007  . HYPERTENSION 01/10/2007  . Non-Q wave myocardial infarction [facility])    "found evidence of one when hospitalized in 06/2014"  . Palpitations 03/19/2008  . PERIPHERAL NEUROPATHY 01/10/2007  . Pneumonia 06/2014  . Proteinuria 06/07/2008  . RENAL INSUFFICIENCY, CHRONIC 06/07/2008  . Renal transplant recipient   . 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 Type II diabetes mellitus ([place]) dx'd ~ 1998    Past Surgical History:  Procedure Laterality Date  . CARDIAC CATHETERIZATION N/A  06/04/2014   Procedure: Left Heart Cath and Coronary Angiography;  Surgeon: Charolette Forward, MD;  Location: [place] INVASIVE CV LAB CUPID;  Service: Cardiovascular;  Laterality: N/A;  . CARDIAC CATHETERIZATION N/A 06/04/2014   Procedure: Coronary Stent Intervention;  Surgeon: Charolette Forward, MD;  Location: [place] CV LAB CUPID;  Service: Cardiovascular;  Laterality: N/A;  . CARDIAC CATHETERIZATION N/A 06/07/2014   Procedure: Coronary Stent Intervention;  Surgeon: Charolette Forward, MD;  Location: [place] INVASIVE CV LAB CUPID;  Service: Cardiovascular;  Laterality: N/A;  . CARDIAC CATHETERIZATION N/A 09/06/2014   Procedure: Left Heart Cath and Coronary Angiography;  Surgeon: Charolette Forward, MD;  Location: [place] CV LAB;  Service: Cardiovascular;  Laterality: N/A;  . CARDIAC CATHETERIZATION N/A 09/06/2014   Procedure: Coronary Stent Intervention;  Surgeon: Charolette Forward, MD;  Location: [place] CV LAB;  Service: Cardiovascular;  Laterality: N/A;  . CARDIAC CATHETERIZATION N/A 10/22/2015   Procedure: Left Heart Cath and Coronary Angiography;  Surgeon: Charolette Forward, MD;  Location: [place] CV LAB;  Service: Cardiovascular;  Laterality: N/A;  . CATARACT EXTRACTION W/ INTRAOCULAR LENS IMPLANT Left ?06/2008  . CORONARY ANGIOPLASTY WITH STENT PLACEMENT  09/06/2014   "2 stents"  . NEPHRECTOMY TRANSPLANTED ORGAN  2013    Family History  Problem Relation Age of Onset  . Diabetes Mother   . Hypertension Mother   . Stroke Mother   . Diabetes Father   .  Diabetes Sister   . Diabetes Brother   . Hypertension Brother   . Hypertension Sister   . Cancer Neg Hx     Social History:  reports that he has never smoked. He has never used smokeless tobacco. He reports that he does not drink alcohol or use drugs.  Allergies: No Known Allergies  Medications:  I have reviewed the patient's current medications. Prior to Admission:  Prescriptions Prior to Admission  Medication Sig Dispense Refill Last Dose  . aspirin EC  81 MG tablet Take 81 mg by mouth daily.   10/18/2015 at Unknown time  . BD ULTRA-FINE PEN NEEDLES 29G X 12.7MM MISC USE AS DIRECTED 100 each 5 10/18/2015 at Unknown time  . brimonidine (ALPHAGAN) 0.2 % ophthalmic solution Place 1 drop into the left eye at bedtime.   10/17/2015  . carvedilol (COREG) 6.25 MG tablet Take 6.25 mg by mouth daily.   10/18/2015 at 1100  . cycloSPORINE (SANDIMMUNE) 100 MG capsule Take 100 mg by mouth 2 (two) times daily.    10/18/2015 at 2000  . dorzolamide-timolol (COSOPT) 22.3-6.8 MG/ML ophthalmic solution Apply 1 drop to eye at bedtime.   10/17/2015 at Unknown time  . fluconazole (DIFLUCAN) 150 MG tablet Take 150 mg by mouth daily.   10/18/2015 at Unknown time  . insulin detemir (LEVEMIR) 100 UNIT/ML injection Inject 25 Units into the skin 2 (two) times daily. And pen needles 1/day   10/18/2015 at BOTH DOSES  . latanoprost (XALATAN) 0.005 % ophthalmic solution Place 1 drop into the left eye at bedtime.   10/17/2015  . Multiple Vitamin (MULTIVITAMIN WITH MINERALS) TABS tablet Take 1 tablet by mouth daily. CENTRUM MULTIVITAMIN FOR 50+   10/18/2015 at Unknown time  . mycophenolate (MYFORTIC) 180 MG EC tablet Take 180 mg by mouth every evening.    10/18/2015 at Unknown time  . prasugrel (EFFIENT) 10 MG TABS tablet Take 1 tablet (10 mg total) by mouth daily. 30 tablet 0 10/18/2015 at Unknown time  . predniSONE (DELTASONE) 5 MG tablet Take 5 mg by mouth daily.   10/18/2015 at Unknown time  . rosuvastatin (CRESTOR) 10 MG tablet Take 10 mg by mouth daily.   10/18/2015 at Unknown time  . rosuvastatin (CRESTOR) 5 MG tablet Take 1 tablet (5 mg total) by mouth daily at 6 PM. (Patient not taking: Reported on 10/19/2015) 30 tablet 0 Not Taking at Unknown time   Scheduled: . aspirin EC  81 mg Oral Daily  . brimonidine  1 drop Left Eye QHS  . carvedilol  3.125 mg Oral BID  . cycloSPORINE  100 mg Oral BID  . dorzolamide  1 drop Left Eye QHS   And  . timolol  1 drop Left Eye QHS  . fluconazole   150 mg Oral Daily  . insulin aspart  0-9 Units Subcutaneous TID WC  . insulin detemir  10 Units Subcutaneous BID  . latanoprost  1 drop Left Eye QHS  . mycophenolate  180 mg Oral QPM  . pantoprazole  40 mg Oral Q0600  . predniSONE  5 mg Oral Daily  . rosuvastatin  10 mg Oral Daily  . sodium chloride flush  3 mL Intravenous Q12H   Continuous: . sodium chloride 999 mL/hr (10/22/15 0959)  . heparin Stopped (10/22/15 0915)  . nitroGLYCERIN 10 mcg/min (10/21/15 1231)   FFM:BWGYKZ chloride, acetaminophen, nitroGLYCERIN, ondansetron (ZOFRAN) IV, sodium chloride flush Anti-infectives    Start     Dose/Rate Route Frequency Ordered Stop  10/19/15 1000  fluconazole (DIFLUCAN) tablet 150 mg     150 mg Oral Daily 10/19/15 0836        Results for orders placed or performed during the hospital encounter of 10/18/15 (from the past 48 hour(s))  CBC     Status: Abnormal   Collection Time: 10/21/15  3:29 AM  Result Value Ref Range   WBC 9.0 4.0 - 10.5 K/uL   RBC 4.54 4.22 - 5.81 MIL/uL   Hemoglobin 12.5 (L) 13.0 - 17.0 g/dL   HCT 39.1 39.0 - 52.0 %   MCV 86.1 78.0 - 100.0 fL   MCH 27.5 26.0 - 34.0 pg   MCHC 32.0 30.0 - 36.0 g/dL   RDW 16.3 (H) 11.5 - 15.5 %   Platelets 290 150 - 400 K/uL  Heparin level (unfractionated)     Status: None   Collection Time: 10/21/15  3:29 AM  Result Value Ref Range   Heparin Unfractionated 0.32 0.30 - 0.70 IU/mL    Comment:        IF HEPARIN RESULTS ARE BELOW EXPECTED VALUES, AND PATIENT DOSAGE HAS BEEN CONFIRMED, SUGGEST FOLLOW UP TESTING OF ANTITHROMBIN III LEVELS.   Basic metabolic panel     Status: Abnormal   Collection Time: 10/21/15  3:29 AM  Result Value Ref Range   Sodium 139 135 - 145 mmol/L   Potassium 4.3 3.5 - 5.1 mmol/L   Chloride 108 101 - 111 mmol/L   CO2 23 22 - 32 mmol/L   Glucose, Bld 223 (H) 65 - 99 mg/dL   BUN 34 (H) 6 - 20 mg/dL   Creatinine, Ser 1.54 (H) 0.61 - 1.24 mg/dL   Calcium 8.6 (L) 8.9 - 10.3 mg/dL   GFR calc non Af  Amer 47 (L) >60 mL/min   GFR calc Af Amer 54 (L) >60 mL/min    Comment: (NOTE) The eGFR has been calculated using the CKD EPI equation. This calculation has not been validated in all clinical situations. eGFR's persistently <60 mL/min signify possible Chronic Kidney Disease.    Anion gap 8 5 - 15  Brain natriuretic peptide     Status: Abnormal   Collection Time: 10/21/15  3:29 AM  Result Value Ref Range   B Natriuretic Peptide 2,349.6 (H) 0.0 - 100.0 pg/mL  Cyclosporine     Status: Abnormal   Collection Time: 10/21/15  3:29 AM  Result Value Ref Range   Cyclosporine, [facility] 722 (H) 100 - 400 ng/mL    Comment: (NOTE)                        Therapeutic:                         Renal Transplant   100 - 250                         Liver Transplant   100 - 400                         Cardiac Transplant 100 - 400                         Bone Marrow        200 - 300  Detection Limit =  25 Assay performed by Liquid Chromatography Tandem Mass Spectrometry (LC-MS/MS) If preferred testing methodology for Cyclosporine is Liquid Chromatography Tandem Mass Spectrometry (LC-MS/MS) please use test code [phone].  For testing performed by Immunoassay, please use test code [phone]. Patient drug level exceeds published reference range.  Evaluate clinically for signs of potential toxicity. Performed At: [facility] [address] [place], [place] [id number] Mila Homer MD [id number]   Glucose, capillary     Status: None   Collection Time: 10/21/15  7:55 AM  Result Value Ref Range   Glucose-Capillary 99 65 - 99 mg/dL  Glucose, capillary     Status: Abnormal   Collection Time: 10/21/15 11:40 AM  Result Value Ref Range   Glucose-Capillary 162 (H) 65 - 99 mg/dL  Glucose, capillary     Status: Abnormal   Collection Time: 10/21/15  4:37 PM  Result Value Ref Range   Glucose-Capillary 221 (H) 65 - 99 mg/dL  Glucose, capillary     Status: Abnormal    Collection Time: 10/21/15 10:30 PM  Result Value Ref Range   Glucose-Capillary 125 (H) 65 - 99 mg/dL   Comment 1 Notify RN    Comment 2 Document in Chart   CBC     Status: Abnormal   Collection Time: 10/22/15  6:04 AM  Result Value Ref Range   WBC 7.9 4.0 - 10.5 K/uL   RBC 4.55 4.22 - 5.81 MIL/uL   Hemoglobin 12.3 (L) 13.0 - 17.0 g/dL   HCT 16.7 56.1 - 25.4 %   MCV 87.5 78.0 - 100.0 fL   MCH 27.0 26.0 - 34.0 pg   MCHC 30.9 30.0 - 36.0 g/dL   RDW 83.2 (H) 34.6 - 88.7 %   Platelets 268 150 - 400 K/uL  Heparin level (unfractionated)     Status: None   Collection Time: 10/22/15  6:04 AM  Result Value Ref Range   Heparin Unfractionated 0.43 0.30 - 0.70 IU/mL    Comment:        IF HEPARIN RESULTS ARE BELOW EXPECTED VALUES, AND PATIENT DOSAGE HAS BEEN CONFIRMED, SUGGEST FOLLOW UP TESTING OF ANTITHROMBIN III LEVELS.   Basic metabolic panel     Status: Abnormal   Collection Time: 10/22/15  6:04 AM  Result Value Ref Range   Sodium 141 135 - 145 mmol/L   Potassium 4.6 3.5 - 5.1 mmol/L   Chloride 113 (H) 101 - 111 mmol/L   CO2 22 22 - 32 mmol/L   Glucose, Bld 162 (H) 65 - 99 mg/dL   BUN 29 (H) 6 - 20 mg/dL   Creatinine, Ser 3.73 (H) 0.61 - 1.24 mg/dL   Calcium 8.4 (L) 8.9 - 10.3 mg/dL   GFR calc non Af Amer 51 (L) >60 mL/min   GFR calc Af Amer 60 (L) >60 mL/min    Comment: (NOTE) The eGFR has been calculated using the CKD EPI equation. This calculation has not been validated in all clinical situations. eGFR's persistently <60 mL/min signify possible Chronic Kidney Disease.    Anion gap 6 5 - 15  Glucose, capillary     Status: Abnormal   Collection Time: 10/22/15  7:46 AM  Result Value Ref Range   Glucose-Capillary 110 (H) 65 - 99 mg/dL  POCT Activated clotting time     Status: None   Collection Time: 10/22/15 10:26 AM  Result Value Ref Range   Activated Clotting Time 164 seconds  Glucose, capillary     Status: Abnormal  Collection Time: 10/22/15 11:01 AM  Result Value  Ref Range   Glucose-Capillary 59 (L) 65 - 99 mg/dL  Glucose, capillary     Status: Abnormal   Collection Time: 10/22/15 11:33 AM  Result Value Ref Range   Glucose-Capillary 129 (H) 65 - 99 mg/dL  Glucose, capillary     Status: Abnormal   Collection Time: 10/22/15  5:03 PM  Result Value Ref Range   Glucose-Capillary 162 (H) 65 - 99 mg/dL  Glucose, capillary     Status: Abnormal   Collection Time: 10/22/15  9:36 PM  Result Value Ref Range   Glucose-Capillary 241 (H) 65 - 99 mg/dL   Comment 1 Notify RN    Comment 2 Document in Chart     No results found.  Review of Systems  Constitutional: Positive for malaise/fatigue. Negative for chills, fever and weight loss.  HENT: Negative.   Eyes: Negative.   Respiratory: Positive for cough and shortness of breath. Negative for sputum production.   Cardiovascular: Positive for orthopnea, leg swelling and PND. Negative for chest pain and palpitations.  Gastrointestinal: Negative.   Genitourinary: Negative.   Musculoskeletal: Negative.   Skin: Negative.   Neurological: Negative.   Endo/Heme/Allergies: Negative.   Psychiatric/Behavioral: Positive for depression.   Blood pressure (!) 142/63, pulse 76, temperature 97.6 F (36.4 C), temperature source Oral, resp. rate 18, height '5\' 7"'$  (1.702 m), weight 63.9 kg (140 lb 14.4 oz), SpO2 93 %. Physical Exam  Constitutional: He is oriented to person, place, and time.  Thin, small frame gentleman in no distress.  HENT:  Head: Normocephalic and atraumatic.  Mouth/Throat: Oropharynx is clear and moist.  Eyes: EOM are normal.  Neck: Normal range of motion. Neck supple. No JVD present. No thyromegaly present.  Cardiovascular: Normal rate, regular rhythm and intact distal pulses.   Murmur heard. 2/6 systolic murmur RSB  Respiratory: Effort normal. He has rales.  In bases  GI: Soft. Bowel sounds are normal. He exhibits no distension and no mass. There is no tenderness.  Musculoskeletal: Normal range  of motion. He exhibits no edema.  Lymphadenopathy:    He has no cervical adenopathy.  Neurological: He is alert and oriented to person, place, and time. He has normal strength. No cranial nerve deficit or sensory deficit.  Skin: Skin is warm and dry.  Psychiatric: He has a normal mood and affect.            *[facility]*                         [place], [place] [address]                            [phone]  ------------------------------------------------------------------- Transthoracic Echocardiography  Patient:    Breyer, Tejera MR #:       [id number] Study Date: 10/20/2015 Gender:     M Age:        1 Height:     170.2 cm Weight:     65 kg BSA:        1.76 m^2 Pt. Status: Room:       3E28C   ADMITTING  Charolette Forward, MD  ORDERING     Charolette Forward, MD  PERFORMING   Charolette Forward, MD  REFERRING    Charolette Forward, MD  ATTENDING    Delora Fuel [id number]  SONOGRAPHER  Donata Clay  cc:  ------------------------------------------------------------------- LV EF: 40% -   45%  ------------------------------------------------------------------- Indications:      CHF - 428.0.  ------------------------------------------------------------------- History:   PMH:   Coronary artery disease.  Acute myocardial infarction.  Risk factors:  Hypertension. Diabetes mellitus. Dyslipidemia.  ------------------------------------------------------------------- Study Conclusions  - Left ventricle: The cavity size was normal. Systolic function was   mildly to moderately reduced. The estimated ejection fraction was   in the range of 40% to 45%. Diffuse hypokinesis. - Aortic valve: There was mild to moderate stenosis. Valve area   (VTI): 0.91 cm^2. Valve area (Vmax): 0.95 cm^2. Valve area   (Vmean): 0.95 cm^2. - Mitral valve: There was mild regurgitation. - Left atrium: The atrium was  mildly dilated. - Pulmonary arteries: PA peak pressure: 45 mm Hg (S).  ------------------------------------------------------------------- Study data:  Comparison was made to the study of 06/05/2014.  Study status:  Routine.  Procedure:  The patient reported no pain pre or post test. Transthoracic echocardiography. Image quality was adequate.  Study completion:  There were no complications. Transthoracic echocardiography.  M-mode, complete 2D, spectral Doppler, and color Doppler.  Birthdate:  Patient birthdate: 03/17/1955.  Age:  Patient is 60 yr old.  Sex:  Gender: male. BMI: 22.5 kg/m^2.  Blood pressure:     144/70  Patient status: Inpatient.  Study date:  Study date: 10/20/2015. Study time: 03:51 PM.  Location:  Bedside.  -------------------------------------------------------------------  ------------------------------------------------------------------- Left ventricle:  The cavity size was normal. Systolic function was mildly to moderately reduced. The estimated ejection fraction was in the range of 40% to 45%. Diffuse hypokinesis.  ------------------------------------------------------------------- Aortic valve:   Mildly calcified leaflets.  Doppler:   There was mild to moderate stenosis.      VTI ratio of LVOT to aortic valve: 0.36. Valve area (VTI): 0.91 cm^2. Indexed valve area (VTI): 0.52 cm^2/m^2. Peak velocity ratio of LVOT to aortic valve: 0.37. Valve area (Vmax): 0.95 cm^2. Indexed valve area (Vmax): 0.54 cm^2/m^2. Mean velocity ratio of LVOT to aortic valve: 0.37. Valve area (Vmean): 0.95 cm^2. Indexed valve area (Vmean): 0.54 cm^2/m^2. Mean gradient (S): 10 mm Hg. Peak gradient (S): 19 mm Hg.  ------------------------------------------------------------------- Mitral valve:   Structurally normal valve.   Leaflet separation was normal.  Doppler:  Transvalvular velocity was within the normal range. There was no evidence for stenosis. There was  mild regurgitation.    Peak gradient (D): 5 mm Hg.  ------------------------------------------------------------------- Left atrium:  The atrium was mildly dilated.  ------------------------------------------------------------------- Right ventricle:  The cavity size was normal. Wall thickness was normal. Systolic function was normal.  ------------------------------------------------------------------- Pulmonic valve:    Structurally normal valve.   Cusp separation was normal.  Doppler:  Transvalvular velocity was within the normal range. There was no regurgitation.  ------------------------------------------------------------------- Tricuspid valve:   Structurally normal valve.   Leaflet separation was normal.  Doppler:  Transvalvular velocity was within the normal range. There was mild regurgitation.  ------------------------------------------------------------------- Right atrium:  The atrium was normal in size.  ------------------------------------------------------------------- Pericardium:  There was no pericardial effusion.  ------------------------------------------------------------------- Measurements   Left ventricle                            Value  Reference  LV ID, ED, PLAX chordal                   48.8  mm       43 - 52  LV ID, ES, PLAX chordal           (H)     40.5  mm       23 - 38  LV fx shortening, PLAX chordal    (L)     17    %        >=29  LV PW thickness, ED                       11.8  mm       ---------  IVS/LV PW ratio, ED                       0.81           <=1.3  Stroke volume, 2D                         46    ml       ---------  Stroke volume/bsa, 2D                     26    ml/m^2   ---------  LV ejection fraction, 1-p A4C             40    %        ---------  LV end-systolic volume, 2-p               67    ml       ---------  LV end-systolic volume/bsa, 2-p           38    ml/m^2   ---------  LV e&', lateral                             5.98  cm/s     ---------  LV E/e&', lateral                          19.06          ---------  LV e&', medial                             3.23  cm/s     ---------  LV E/e&', medial                           35.29          ---------  LV e&', average                            4.61  cm/s     ---------  LV E/e&', average                          24.76          ---------    Ventricular septum                        Value  Reference  IVS thickness, ED                         9.55  mm       ---------    LVOT                                      Value          Reference  LVOT ID, S                                18    mm       ---------  LVOT area                                 2.54  cm^2     ---------  LVOT peak velocity, S                     80.5  cm/s     ---------  LVOT mean velocity, S                     56.8  cm/s     ---------  LVOT VTI, S                               18.2  cm       ---------    Aortic valve                              Value          Reference  Aortic valve peak velocity, S             216   cm/s     ---------  Aortic valve mean velocity, S             152   cm/s     ---------  Aortic valve VTI, S                       50.6  cm       ---------  Aortic mean gradient, S                   10    mm Hg    ---------  Aortic peak gradient, S                   19    mm Hg    ---------  VTI ratio, LVOT/AV                        0.36           ---------  Aortic valve area, VTI                    0.91  cm^2     ---------  Aortic valve area/bsa, VTI                0.52  cm^2/m^2 ---------  Velocity ratio, peak, LVOT/AV  0.37           ---------  Aortic valve area, peak velocity          0.95  cm^2     ---------  Aortic valve area/bsa, peak               0.54  cm^2/m^2 ---------  velocity  Velocity ratio, mean, LVOT/AV             0.37           ---------  Aortic valve area, mean velocity          0.95  cm^2     ---------  Aortic valve area/bsa, mean                0.54  cm^2/m^2 ---------  velocity    Aorta                                     Value          Reference  Aortic root ID, ED                        27    mm       ---------    Left atrium                               Value          Reference  LA ID, A-P, ES                            47    mm       ---------  LA ID/bsa, A-P                    (H)     2.68  cm/m^2   <=2.2  LA volume, S                              59.6  ml       ---------  LA volume/bsa, S                          33.9  ml/m^2   ---------  LA volume, ES, 1-p A4C                    63    ml       ---------  LA volume/bsa, ES, 1-p A4C                35.9  ml/m^2   ---------  LA volume, ES, 1-p A2C                    51.2  ml       ---------  LA volume/bsa, ES, 1-p A2C                29.2  ml/m^2   ---------    Mitral valve                              Value  Reference  Mitral E-wave peak velocity               114   cm/s     ---------  Mitral A-wave peak velocity               65.5  cm/s     ---------  Mitral deceleration time                  190   ms       150 - 230  Mitral peak gradient, D                   5     mm Hg    ---------  Mitral E/A ratio, peak                    1.7            ---------    Pulmonary arteries                        Value          Reference  PA pressure, S, DP                (H)     45    mm Hg    <=30    Tricuspid valve                           Value          Reference  Tricuspid regurg peak velocity            323   cm/s     ---------  Tricuspid peak RV-RA gradient             42    mm Hg    ---------    Systemic veins                            Value          Reference  Estimated CVP                             3     mm Hg    ---------    Right ventricle                           Value          Reference  TAPSE                                     18.9  mm       ---------  RV pressure, S, DP                (H)     45    mm Hg    <=30  RV s&', lateral, S                          10.6  cm/s     ---------    Pulmonic valve  Value          Reference  Pulmonic regurg velocity, ED              187   cm/s     ---------  Pulmonic regurg gradient, ED              14    mm Hg    ---------  Legend: (L)  and  (H)  mark values outside specified reference range.  ------------------------------------------------------------------- Prepared and Electronically Authenticated by  Charolette Forward, MD 2017-09-17T16:46:33  Physicians   Panel Physicians Referring Physician Case Authorizing Physician  Charolette Forward, MD (Primary)    Procedures   Left Heart Cath and Coronary Angiography  Conclusion    Prox RCA to Dist RCA lesion, 30 %stenosed.  Ost 1st Mrg to 1st Mrg lesion, 0 %stenosed.  Ost Cx to Prox Cx lesion, 95 %stenosed.  Ost LM lesion, 70 %stenosed.  Ost 1st Diag to 1st Diag lesion, 0 %stenosed.  Ost LAD to Mid LAD lesion, 0 %stenosed.  Ost LAD lesion, 90 %stenosed.    Procedural Details/Technique   Technical Details After obtaining the informed consent patient was brought to the Cath Lab and was placed on fluoroscopy table. Right groin was prepped and draped in usual fashion. 1% Xylocaine was used for local anesthesia in the right groin. Patient received 1 mg of Versed and 25 g of fentanyl for IV conscious sedation patient was monitored during the procedure. Conscious sedation time was 22 minutes. With the help of thinwall needle 5 French arterial sheath was placed. Sheath was aspirated and flushed. Next 5 French left Judkins catheter was advanced over the wire under fluoroscopic guidance up to the ascending aorta wire was pulled out the catheter was aspirated and connected to the manifold multiple views of the left system were taken next the catheter was disengaged and was pulled out over the wire and was replaced with 5 [place] AL-1 diagnostic catheter which was advanced over the wire under fluoroscopic guidance up to the  ascending aorta wire was pulled out the catheter was aspirated and connected to the manifold cath was further advanced and engaged into right coronary ostium is single view of right coronary artery was obtained next this catheter was disengaged and was pulled out over the wire and was replaced with 5 French pigtail catheter which was advanced over the wire under fluoroscopic guidance up to the ascending aorta wire was pulled out the catheter was aspirated and connected to the manifold catheter was further advanced across aortic valve into the LV LV pressures were recorded. LVEDP was 25 mmHg next the pigtail catheter was pulled out into the ascending aorta there was no gradient across aortic valve neck pigtail catheter was pulled out over the wire she's aspirated and flushed. Patient received total of 22 mL of contrast during the procedure. Patient tolerated the procedure well Findings LVEDP was 25 mmHg Left main is 60-70% distal stenosis LAD has 90-95% ostial in-stent restenosis Diagonal 1 stent is patent Left circumflex as 95% proximal in-stent restenosis High OM1 is patent RCA has 30-40% mid in-stent restenosis distally the vessel is small and diffusely diseased as before. Patient was transferred to recovery room in stable condition. We will get CVT S consult and discuss with family again regarding CABG.   Estimated blood loss <50 mL. . During this procedure the patient was administered the following to achieve and maintain moderate conscious sedation: Versed 1 mg, Fentanyl 25 mcg, while the patient's heart rate,  blood pressure, and oxygen saturation were continuously monitored. The period of conscious sedation was 22 minutes, of which I was present face-to-face 100% of this time.    Coronary Findings   Dominance: Right  Left Main  Ost LM lesion, 70% stenosed. The lesion was not previously treated.  Left Anterior Descending  Ost LAD lesion, 90% stenosed.  Ost LAD to Mid LAD lesion, 0% stenosed.  Ost LAD to Mid LAD lesion with no stenosis was previously treated. Previously placed stent displays restenosis.  First Diagonal Branch  Ost 1st Diag to 1st Diag lesion, 0% stenosed. Ost 1st Diag to 1st Diag lesion with no stenosis was previously treated. Previously placed stent displays no restenosis.  Left Circumflex  Ost Cx to Prox Cx lesion, 95% stenosed. The lesion was previously treatedbetween 1-2 years ago. Previously placed stent displays restenosis.  First Obtuse Marginal Branch  Ost 1st Mrg to 1st Mrg lesion, 0% stenosed. Previously placed Ost 1st Mrg to 1st Mrg drug eluting stent is widely patent. Previously placed stent displays no restenosis.  Right Coronary Artery  Prox RCA to Dist RCA lesion, 30% stenosed. The lesion was previously treated using a drug eluting stent between 1-2 years ago. Previously placed stent displays restenosis.  Coronary Diagrams   Diagnostic Diagram     Implants     No implant documentation for this case.  PACS Images   Show images for Cardiac catheterization   Link to Procedure Log   Procedure Log    Hemo Data   Flowsheet Row Most Recent Value  AO Systolic Pressure 253 mmHg  AO Diastolic Pressure 64 mmHg  AO Mean 92 mmHg  LV Systolic Pressure 664 mmHg  LV Diastolic Pressure 15 mmHg  LV EDP 25 mmHg  Arterial Occlusion Pressure Extended Systolic Pressure 403 mmHg  Arterial Occlusion Pressure Extended Diastolic Pressure 63 mmHg  Arterial Occlusion Pressure Extended Mean Pressure 94 mmHg  Left Ventricular Apex Extended Systolic Pressure 474 mmHg  Left Ventricular Apex Extended Diastolic Pressure 14 mmHg  Left Ventricular Apex Extended EDP Pressure 25 mmHg     Assessment/Plan:  I have personally reviewed and interpreted his echo and cardiac cath films. He has severe left main and multi-vessel coronary disease with relatively small and diffusely diseased diabetic vessels. His vessels are graftable and he is not a candidate for any further  PCI. His Hgb A1c was 12.8 and it was 11.6 in February. His long term prognosis is poor unless he can get his risk factors under control. He has mild aortic stenosis but the valve is trileaflet and and the leaflets move well with mild calcification so I don't think AVR is needed at this time. He is at increased risk for surgery due to his poorly controlled DM and is at risk of dysfunction of his transplanted kidney. I discussed the operative procedure with the patient including alternatives, benefits and risks; including but not limited to bleeding, blood transfusion, infection, stroke, myocardial infarction, graft failure, heart block requiring a permanent pacemaker, organ dysfunction, and death.  [place] understands and agrees to proceed.  We will schedule surgery for Monday since he has been on Effient and needs to be off of that for at least 5 days preop. Monday is the first day that we can get him on the schedule. Dr. Terrence Dupont will decide if the patient can go home and return on the morning of surgery. If he can then my office will arrange his return. Please let my office know.  I  spent 80 minutes performing this consultation and > 50% of this time was spent face to face counseling and coordinating the care of this patient's severe left main and multi-vessel coronary artery disease.  Gaye Pollack 10/22/2015, 10:04 PM

## 2015-10-22 NOTE — Progress Notes (Signed)
Subjective:  Patient denies any chest pain or shortness of breath. Patient tolerated cardiac catheterization earlier today noted to have aggressive in-stent restenosis in ostial LAD and proximal left circumflex and distal left main stenosis. Discussed with patient and family at length and agreed for CABG    VObjective:  Vital Signs in the last 24 hours: Temp:  [97.6 F (36.4 C)-98.1 F (36.7 C)] 97.6 F (36.4 C) (09/19 1525) Pulse Rate:  [0-80] 75 (09/19 1525) Resp:  [0-24] 21 (09/19 1525) BP: (129-181)/(55-97) 156/67 (09/19 1525) SpO2:  [94 %-100 %] 96 % (09/19 1525) Weight:  [140 lb 14.4 oz (63.9 kg)] 140 lb 14.4 oz (63.9 kg) (09/19 0659)  Intake/Output from previous day: 09/18 0701 - 09/19 0700 In: 1551 [P.O.:480; I.V.:1071] Out: 550 [Urine:550] Intake/Output from this shift: Total I/O In: 0  Out: 600 [Urine:600]  Physical Exam: Neck: no adenopathy, no carotid bruit, no JVD and supple, symmetrical, trachea midline Lungs: clear to auscultation bilaterally Heart: regular rate and rhythm, S1, S2 normal and Soft systolic murmur noted Abdomen: soft, non-tender; bowel sounds normal; no masses,  no organomegaly Extremities: extremities normal, atraumatic, no cyanosis or edema and Right groin stable  Lab Results:  Recent Labs  10/21/15 0329 10/22/15 0604  WBC 9.0 7.9  HGB 12.5* 12.3*  PLT 290 268    Recent Labs  10/21/15 0329 10/22/15 0604  NA 139 141  K 4.3 4.6  CL 108 113*  CO2 23 22  GLUCOSE 223* 162*  BUN 34* 29*  CREATININE 1.54* 1.43*    Recent Labs  10/19/15 2105 10/20/15 0608  TROPONINI 0.23* 0.23*   Hepatic Function Panel No results for input(s): PROT, ALBUMIN, AST, ALT, ALKPHOS, BILITOT, BILIDIR, IBILI in the last 72 hours.  Recent Labs  10/20/15 0608  CHOL 136   No results for input(s): PROTIME in the last 72 hours.  Imaging: Imaging results have been reviewed and No results found.  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  LOS: 3 days    [name], Darcia Lampi 10/22/2015, 6:07 PM

## 2015-10-23 LAB — RENAL FUNCTION PANEL
ANION GAP: 6 (ref 5–15)
Albumin: 2.4 g/dL — ABNORMAL LOW (ref 3.5–5.0)
BUN: 25 mg/dL — ABNORMAL HIGH (ref 6–20)
CALCIUM: 8.4 mg/dL — AB (ref 8.9–10.3)
CO2: 21 mmol/L — AB (ref 22–32)
Chloride: 110 mmol/L (ref 101–111)
Creatinine, Ser: 1.33 mg/dL — ABNORMAL HIGH (ref 0.61–1.24)
GFR calc Af Amer: >60 mL/min (ref >60)
GFR calc non Af Amer: 56 mL/min — ABNORMAL LOW (ref >60)
Glucose, Bld: 220 mg/dL — ABNORMAL HIGH (ref 65–99)
PHOSPHORUS: 3.1 mg/dL (ref 2.5–4.6)
Potassium: 4.7 mmol/L (ref 3.5–5.1)
SODIUM: 137 mmol/L (ref 135–145)

## 2015-10-23 LAB — GLUCOSE, CAPILLARY
GLUCOSE-CAPILLARY: 141 mg/dL — AB (ref 65–99)
GLUCOSE-CAPILLARY: 209 mg/dL — AB (ref 65–99)
Glucose-Capillary: 143 mg/dL — ABNORMAL HIGH (ref 65–99)
Glucose-Capillary: 221 mg/dL — ABNORMAL HIGH (ref 65–99)

## 2015-10-23 LAB — CBC
HEMATOCRIT: 39.2 % (ref 39.0–52.0)
HEMOGLOBIN: 12.1 g/dL — AB (ref 13.0–17.0)
MCH: 26.9 pg (ref 26.0–34.0)
MCHC: 30.9 g/dL (ref 30.0–36.0)
MCV: 87.3 fL (ref 78.0–100.0)
Platelets: 260 10*3/uL (ref 150–400)
RBC: 4.49 MIL/uL (ref 4.22–5.81)
RDW: 16.5 % — ABNORMAL HIGH (ref 11.5–15.5)
WBC: 7.1 10*3/uL (ref 4.0–10.5)

## 2015-10-23 LAB — HEPARIN LEVEL (UNFRACTIONATED)
HEPARIN UNFRACTIONATED: 0.32 [IU]/mL (ref 0.30–0.70)
Heparin Unfractionated: <0.1 IU/mL — ABNORMAL LOW (ref 0.30–0.70)

## 2015-10-23 MED ORDER — LIVING WELL WITH DIABETES BOOK
Freq: Once | Status: AC
  Administered 2015-10-23: 11:00:00
  Filled 2015-10-23: qty 1

## 2015-10-23 MED ORDER — MOVING RIGHT ALONG BOOK
Freq: Once | Status: AC
  Administered 2015-10-23: 22:00:00
  Filled 2015-10-23: qty 1

## 2015-10-23 MED ORDER — HEART ATTACK BOUNCING BOOK
Freq: Once | Status: AC
  Administered 2015-10-23: 06:00:00
  Filled 2015-10-23: qty 1

## 2015-10-23 MED ORDER — ~~LOC~~ CARDIAC SURGERY, PATIENT & FAMILY EDUCATION
Freq: Once | Status: AC
  Administered 2015-10-23: 22:00:00
  Filled 2015-10-23: qty 1

## 2015-10-23 MED ORDER — LIVING BETTER WITH HEART FAILURE BOOK
Freq: Once | Status: AC
  Administered 2015-10-23: 06:00:00

## 2015-10-23 MED ORDER — ANGIOPLASTY BOOK
Freq: Once | Status: AC
Start: 2015-10-23 — End: 2015-10-23
  Administered 2015-10-23: 06:00:00
  Filled 2015-10-23: qty 1

## 2015-10-23 NOTE — Progress Notes (Signed)
Subjective: appreciated CVTS consult and help patient tentatively scheduled for Monday for CABG Patient denies any chest pain or shortness of breath.   Objective:  Vital Signs in the last 24 hours: Temp:  [97.6 F (36.4 C)-98.1 F (36.7 C)] 98 F (36.7 C) (09/20 0813) Pulse Rate:  [0-79] 71 (09/20 0813) Resp:  [0-24] 21 (09/20 0813) BP: (140-181)/(62-97) 164/69 (09/20 0813) SpO2:  [93 %-100 %] 94 % (09/20 0813) Weight:  [138 lb 14.2 oz (63 kg)] 138 lb 14.2 oz (63 kg) (09/20 0621)  Intake/Output from previous day: 09/19 0701 - 09/20 0700 In: 619.3 [I.V.:619.3] Out: 1300 [Urine:1300] Intake/Output from this shift: No intake/output data recorded.  Physical Exam: Neck: no adenopathy, no carotid bruit, no JVD and supple, symmetrical, trachea midline Lungs: clear to auscultation bilaterally Heart: regular rate and rhythm, S1, S2 normal and soft systolic murmur noted Abdomen: soft, non-tender; bowel sounds normal; no masses,  no organomegaly Extremities: extremities normal, atraumatic, no cyanosis or edema and right groin stable  Lab Results:  Recent Labs  10/22/15 0604 10/23/15 0326  WBC 7.9 7.1  HGB 12.3* 12.1*  PLT 268 260    Recent Labs  10/22/15 0604 10/23/15 0326  NA 141 137  K 4.6 4.7  CL 113* 110  CO2 22 21*  GLUCOSE 162* 220*  BUN 29* 25*  CREATININE 1.43* 1.33*   No results for input(s): TROPONINI in the last 72 hours.  Invalid input(s): CK, MB Hepatic Function Panel  Recent Labs  10/23/15 0326  ALBUMIN 2.4*   No results for input(s): CHOL in the last 72 hours. No results for input(s): PROTIME in the last 72 hours.  Imaging: Imaging results have been reviewed and No results found.  Cardiac Studies:  Assessment/Plan:  Compensated systolic heart failure Probable small non-Q-wave myocardial infarction Multivessel CAD status post multivessel PCI in the past status post left cath with aggressive in-stent restenosis in ostial LAD and proximal  left circumflex and distal left main stenosis Hypertension Diabetes mellitus Hyperlipidemia History of end-stage renal disease status post renal transplant Chronic kidney diseaseStage II Anemia of chronic disease Peripheral neuropathy Peripheral vascular disease Depression Plan Continue IV heparin and IV nitrates Will keep patient in hospital.  In view of aggressive restenosis in left main stenosis and no typical anginal symptoms in the past despite 2 MIs.  Discussed with patient and family and agree.  LOS: 4 days    [name], Britt Theard 10/23/2015, 9:53 AM

## 2015-10-23 NOTE — Progress Notes (Addendum)
Inpatient Diabetes Program Recommendations  AACE/ADA: New Consensus Statement on Inpatient Glycemic Control (2015)  Target Ranges:  Prepandial:   less than 140 mg/dL      Peak postprandial:   less than 180 mg/dL (1-2 hours)      Critically ill patients:  140 - 180 mg/dL   Lab Results  Component Value Date   GLUCAP 141 (H) 10/23/2015   HGBA1C 12.8 (H) 10/19/2015    Spoke with patient about diabetes and home regimen for diabetes control. Patient reports that he is followed by Dr. Everardo AllEllison (Endocrinology) for diabetes management and currently he takes Levemir 40 units BID. Patient reports that he is taking insulin as prescribed and that he last saw Dr. Everardo AllEllison February this year. Patient reports that no changes were made with his insulin at his last office visit. Patient states that he checks his glucose time a day in the mornings.  Inquired about prior A1C and patient reports that he thinks his A1c was 11 something. Discussed A1C results (12.8% on 10/19/15). Discussed glucose and A1C goals. Discussed importance of checking CBGs and maintaining good CBG control. Explained how hyperglycemia leads to damage within blood vessels which lead to the common complications seen with uncontrolled diabetes. Stressed to the patient the importance of improving glycemic control to prevent further complications from uncontrolled diabetes. Discussed impact of nutrition, exercise, stress, sickness, and medications on diabetes control. Patient eats rice and breads, however he also reports not eating large portion sizes. Patient lives a sedentary lifestyle. Discussed carbohydrates, carbohydrate goals per day and meal, along with portion sizes. Encouraged patient to check his glucose 2-3 times per day (Fasting glucose and alternating second check) and to keep a log book of glucose readings and insulin taken which he will need to take to doctor appointments. Discussed patient's current insulin doses inpatient and encourage  them about glucose control inpatient before, during, and after surgery. Patient and wife verbalized understanding of information discussed and he states that he has no further questions at this time related to diabetes.  Will follow while inpatient  Thanks, Christena DeemShannon Sharna Gabrys RN, MSN, [facility] Inpatient Diabetes Coordinator Team Pager [phone] (8a-5p)

## 2015-10-23 NOTE — Progress Notes (Signed)
ANTICOAGULATION CONSULT NOTE - Follow up  Pharmacy Consult for Heparin Indication: chest pain/ACS  No Known Allergies  Patient Measurements: Height: 5\' 7"  (170.2 cm) Weight: 138 lb 14.2 oz (63 kg) IBW/kg (Calculated) : 66.1 Heparin Dosing Weight: 63.9 kg  Vital Signs: Temp: 98 F (36.7 C) (09/20 1233) Temp Source: Oral (09/20 1233) BP: 163/70 (09/20 1233) Pulse Rate: 73 (09/20 1233)  Labs:  Recent Labs  10/21/15 0329 10/22/15 0604 10/23/15 0326 10/23/15 1414  HGB 12.5* 12.3* 12.1*  --   HCT 39.1 39.8 39.2  --   PLT 290 268 260  --   HEPARINUNFRC 0.32 0.43 <0.10* 0.32  CREATININE 1.54* 1.43* 1.33*  --     Estimated Creatinine Clearance: 52 mL/min (by C-G formula based on SCr of 1.33 mg/dL (H)).   Assessment: 60 y.o male with h/o kidney transplant with SOB, admitted 10/18/15 with + troponin in ED.  Pharmacy consulted for heparin infusion protocol. Patient underwent cardiac catheterization on 9/19 with plan for CABG on 9/25.  -1400 HL 0.32 -CBC WNL and stable -No S/Sx of bleeding  Goal of Therapy:  Heparin level 0.3-0.7 units/ml Monitor platelets by anticoagulation protocol: Yes   Plan:  -Continue heparin at 800 units/hr -Daily heparin level and CBC  Gwyndolyn KaufmanKai Malayshia All Bernette Redbird(Kenny), PharmD  PGY1 Pharmacy Resident Pager: [phone] 10/23/2015 3:27 PM

## 2015-10-23 NOTE — Progress Notes (Signed)
CARDIAC REHAB PHASE I   PRE:  Rate/Rhythm: 74 SR    BP: sitting 154/63    SaO2:   MODE:  Ambulation: 400 ft   POST:  Rate/Rhythm: 78 SR    BP: sitting 159/76     SaO2:   Ambulated with his rollator and light gait belt assist. No CP, SOB, or balance issues. Did well, no c/o. Discussed preop ed with pt and wife with good reception. He will walk with family preop unless we have time. Still needs IS (await PFTs). His wife will be with him at d/c. [id number]  Kahlyn Shippey [name] Idelle CrouchReeve CES, ACSM 10/23/2015 10:21 AM

## 2015-10-23 NOTE — Progress Notes (Addendum)
Inpatient Diabetes Program Recommendations  AACE/ADA: New Consensus Statement on Inpatient Glycemic Control (2015)  Target Ranges:  Prepandial:   less than 140 mg/dL      Peak postprandial:   less than 180 mg/dL (1-2 hours)      Critically ill patients:  140 - 180 mg/dL   Lab Results  Component Value Date   GLUCAP 143 (H) 10/23/2015   HGBA1C 12.8 (H) 10/19/2015   Review of Glycemic Control  Diabetes history: DM 2 Outpatient Diabetes medications: Levemir 25 units BID Current orders for Inpatient glycemic control: Levemir 10 units BID, Novolog Sensitive TID  Consider Novolog 2 units TID meal coverage if patient consumes at least 50% of meals.  A1c 12.8% high risk/very poor control. Patient saw Dr. Everardo AllEllison (Endocrinology) in February this year. Patient to be inpatient until surgery. Will speak to patient and follow while inpatient. RNs to work with patient  And teach patient DM survival skills to control glucose levels. Will reinforce education to improve outcomes.  Thanks,  Christena DeemShannon Shyann Hefner RN, MSN, [facility] Inpatient Diabetes Coordinator Team Pager [phone] (8a-5p)

## 2015-10-23 NOTE — Progress Notes (Signed)
[facility] Rounding Note  Subjective/Interval History:  Cath and surgery eval yesterday CABG planned for severe LM and multivessel ds - for next Monday No CP or SOB Voiding fine  Objective Vital signs in last 24 hours: Vitals:   10/22/15 1500 10/22/15 1525 10/22/15 1957 10/23/15 0621  BP: (!) 164/70 (!) 156/67 (!) 142/63 (!) 144/64  Pulse: 75 75 76 75  Resp: 15 (!) 21 18 15   Temp:  97.6 F (36.4 C) 97.6 F (36.4 C) 98.1 F (36.7 C)  TempSrc:  Oral Oral Oral  SpO2: 95% 96% 93% 94%  Weight:    63 kg (138 lb 14.2 oz)  Height:       Weight change: -0.912 kg (-2 lb 0.2 oz)  Intake/Output Summary (Last 24 hours) at 10/23/15 0814 Last data filed at 10/23/15 0131  Gross per 24 hour  Intake            255.6 ml  Output              700 ml  Net           -444.4 ml   Physical Exam:  Blood pressure (!) 144/64, pulse 75, temperature 98.1 F (36.7 C), temperature source Oral, resp. rate 15, height 5\' 7"  (1.702 m), weight 63 kg (138 lb 14.2 oz), SpO2 94 %.   Very pleasant, soft spoken No JVD Lungs clear Cardiac rhythm regular, normal S1S2 No S3 or S4 Abdomen non distended. + BS.  RLQ renal allograft firm and nontender No LE edema Right upper arm AVF with + bruit and thrill (has never been used)    Labs:  Recent Labs Lab 10/18/15 0020 10/19/15 0857 10/20/15 0608 10/21/15 0329 10/22/15 0604 10/23/15 0326  NA 140 142 141 139 141 137  K 5.3* 4.4 4.3 4.3 4.6 4.7  CL 109 109 109 108 113* 110  CO2 24 22 21* 23 22 21*  GLUCOSE 171* 87 100* 223* 162* 220*  BUN 44* 39* 36* 34* 29* 25*  CREATININE 1.45* 1.39* 1.46* 1.54* 1.43* 1.33*  CALCIUM 9.1 9.2 8.7* 8.6* 8.4* 8.4*  PHOS  --   --   --   --   --  3.1     Recent Labs Lab 10/19/15 0857 10/23/15 0326  AST 27  --   ALT 27  --   ALKPHOS 103  --   BILITOT 0.9  --   PROT 7.1  --   ALBUMIN 2.7* 2.4*     Recent Labs Lab 10/18/15 0020  10/19/15 0857 10/20/15 0608 10/21/15 0329 10/22/15 0604 10/23/15 0326  WBC 9.1  < >  10.1 9.0 9.0 7.9 7.1  NEUTROABS 6.6  --  6.5  --   --   --   --   HGB 13.7  < > 13.5 12.4* 12.5* 12.3* 12.1*  HCT 41.0  < > 42.3 40.0 39.1 39.8 39.2  MCV 84.4  < > 85.6 86.8 86.1 87.5 87.3  PLT 324  < > 303 300 290 268 260  < > = values in this interval not displayed.   Recent Labs Lab 10/19/15 0857 10/19/15 1533 10/19/15 2105 10/20/15 0608  TROPONINI 0.30* 0.23* 0.23* 0.23*     Recent Labs Lab 10/22/15 1101 10/22/15 1133 10/22/15 1703 10/22/15 2136 10/23/15 0624  GLUCAP 59* 129* 162* 241* 143*   Results for Beckie SaltsKHAN, AMAAH (MRN [id number]) as of 10/22/2015 13:02  Ref. Range 10/21/2015 03:29  Cyclosporine, [facility] Latest Ref Range: 100 - 400 ng/mL 722 (H)  Drawn 4.5 hours post dose - not a valid result    Medications:  sodium chloride 999 mL/hr (10/22/15 0959)   heparin 800 Units/hr (10/23/15 0540)   nitroGLYCERIN 10 mcg/min (10/21/15 1231)    aspirin EC  81 mg Oral Daily   brimonidine  1 drop Left Eye QHS   carvedilol  3.125 mg Oral BID   cycloSPORINE  100 mg Oral BID   dorzolamide  1 drop Left Eye QHS   And   timolol  1 drop Left Eye QHS   fluconazole  150 mg Oral Daily   insulin aspart  0-9 Units Subcutaneous TID WC   insulin detemir  10 Units Subcutaneous BID   latanoprost  1 drop Left Eye QHS   mycophenolate  180 mg Oral QPM   pantoprazole  40 mg Oral Q0600   predniSONE  5 mg Oral Daily   rosuvastatin  10 mg Oral Daily   sodium chloride flush  3 mL Intravenous Q12H   Coronary Angio findings: (10/22/15) LVEDP was 25 mmHg Left main is 60-70% distal stenosis LAD has 90-95% ostial in-stent restenosis Diagonal 1 stent is patent Left circumflex as 95% proximal in-stent restenosis High OM1 is patent RCA has 30-40% mid in-stent restenosis distally the vessel is small and diffusely diseased as before.  Patient was transferred to recovery room in stable condition.  Per Dr. Sharyn Lull "We will get CVTS consult and discuss with family again  regarding CABG".    Background: 60 year old man of [place] origin with HTN, DM, CAD (s/p multivessel PCI 2016 and prior refusal of CABG), HLD and peripheral neuropathy.  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 Last creatinine noted was 1.1 in June, 2017 and follows with Dr. Annie Sable.  Admitted with SOB, LE edema. With diuresis,  edema and SOB have resolved.   We were consulted to follow along his renal function after the development of AKI on CKD with creatinine of 1.45 after diuretic therapy) in anticipation of coronary angiogram on Tuesday 9/19.  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 LVEF 40-45% by ECHO 9/17 with mild MR  LM and multivesselCAD  Needs CABG Scheduled for next Monday Dr. Sharyn Lull to decide if to remain in hosp vs home and back On heparin/IV nitro  Hypertension:  Controlled   Insulin-dependent diabetes mellitus:  Fair control  Camille Bal, MD [facility] [phone] Pager 10/23/2015, 8:14 AM

## 2015-10-23 NOTE — Progress Notes (Signed)
Provided education for patient and family.  Covered Diabetes, CHF and Open Heart Surgery.   Utilized books: Cardiac Surgery: Patient and Family Education Booklet Going for Heart Surgery: what you need to know Moving Right Along: after heart surgery CHF booklet by the AHA Living Better with Heart Failure: Patient Information Packet Living Well with Diabetes  Covered diet and fluid restrictions for both Diabetic and CHF conditions.  Educated patient and family on hidden sodium in foods, showed them how to access nutritional information from restaurants and nutritional labels.  Used soup and cereal boxes as examples on how to read a nutritional label and to show the amount of sodium in common every day foods.  Educated pt and family on sodium content from favorite foods and restaurants including oriental food (soy sauce), mayflower (baby fried shrimp), [employer] (fish sandwich).  Covered link between diabetes and heart disease.  Discussed post op precautions for heart surgery, focusing on the importance of good hand hygiene and how it reduces the risk of infection in the surgical wound.  Discussed the importance of splinting when coughing or sneezing after heart surgery.  Educated pt and family on how to access the video system and what videos are important at this time. (102, 105, 109, 112, 113, 115, 126, 127, 501, 502, 503, 505, 507, and 509) Tried to start a video but the video system was down, will try again later.    Educated pt and family on nitro, what the drip is for, how and why it works and the importance of making staff aware if his chest pain returns.  Will continue to monitor pt closely.

## 2015-10-24 LAB — CBC
HCT: 38.3 % — ABNORMAL LOW (ref 39.0–52.0)
Hemoglobin: 11.9 g/dL — ABNORMAL LOW (ref 13.0–17.0)
MCH: 27 pg (ref 26.0–34.0)
MCHC: 31.1 g/dL (ref 30.0–36.0)
MCV: 86.8 fL (ref 78.0–100.0)
PLATELETS: 256 10*3/uL (ref 150–400)
RBC: 4.41 MIL/uL (ref 4.22–5.81)
RDW: 16.4 % — AB (ref 11.5–15.5)
WBC: 7.1 10*3/uL (ref 4.0–10.5)

## 2015-10-24 LAB — RENAL FUNCTION PANEL
ALBUMIN: 2.4 g/dL — AB (ref 3.5–5.0)
Anion gap: 5 (ref 5–15)
BUN: 22 mg/dL — AB (ref 6–20)
CO2: 23 mmol/L (ref 22–32)
Calcium: 8.6 mg/dL — ABNORMAL LOW (ref 8.9–10.3)
Chloride: 112 mmol/L — ABNORMAL HIGH (ref 101–111)
Creatinine, Ser: 1.27 mg/dL — ABNORMAL HIGH (ref 0.61–1.24)
GFR calc Af Amer: >60 mL/min (ref >60)
GFR, EST NON AFRICAN AMERICAN: 59 mL/min — AB (ref >60)
GLUCOSE: 178 mg/dL — AB (ref 65–99)
PHOSPHORUS: 3.4 mg/dL (ref 2.5–4.6)
Potassium: 4.5 mmol/L (ref 3.5–5.1)
Sodium: 140 mmol/L (ref 135–145)

## 2015-10-24 LAB — GLUCOSE, CAPILLARY
GLUCOSE-CAPILLARY: 118 mg/dL — AB (ref 65–99)
GLUCOSE-CAPILLARY: 172 mg/dL — AB (ref 65–99)
Glucose-Capillary: 126 mg/dL — ABNORMAL HIGH (ref 65–99)
Glucose-Capillary: 164 mg/dL — ABNORMAL HIGH (ref 65–99)

## 2015-10-24 LAB — SURGICAL PCR SCREEN
MRSA, PCR: NEGATIVE
Staphylococcus aureus: NEGATIVE

## 2015-10-24 LAB — HEPARIN LEVEL (UNFRACTIONATED): HEPARIN UNFRACTIONATED: 0.41 [IU]/mL (ref 0.30–0.70)

## 2015-10-24 MED ORDER — GUAIFENESIN-DM 100-10 MG/5ML PO SYRP
15.0000 mL | ORAL_SOLUTION | ORAL | Status: DC | PRN
  Administered 2015-10-24 (×2): 15 mL via ORAL
  Filled 2015-10-24 (×2): qty 15

## 2015-10-24 NOTE — Progress Notes (Signed)
Meds given including cyclosporin which was instructed to be given 1 hr post lab draw.  Pt denies pain or SOB.  C/o cough.  Faint crackles bil.  Dr. Sharyn LullHarwani in to speak with patient and family.  Education reinforced in small doses due to prevent feelings of being overwhelmed.  Pt eating food brought by family, reminded visitors to provide low sodium foods.  Diet changed to carb mod/heart healthy with no pork due to pt preference.  MD advised may transfer pt to tele when bed available.

## 2015-10-24 NOTE — Care Management Note (Signed)
Case Management Note  Patient Details  Name: Steven Riddle MRN: [id number] Date of Birth: Sep 08, 1955  Subjective/Objective:    S/p heart cath, multivessel CAD with left main stenosis, plan for CABG on Monday.  Conts on heparin and nitro drip. NCM will cont to follow for dc needs.                Action/Plan:   Expected Discharge Date:                  Expected Discharge Plan:  Home w Home Health Services  In-House Referral:     Discharge planning Services  CM Consult  Post Acute Care Choice:    Choice offered to:     DME Arranged:    DME Agency:     HH Arranged:    HH Agency:     Status of Service:  In process, will continue to follow  If discussed at Long Length of Stay Meetings, dates discussed:    Additional Comments:  Leone Havenaylor, [place] Coffin Clinton, RN 10/24/2015, 8:57 AM

## 2015-10-24 NOTE — Progress Notes (Signed)
[facility] Rounding Note  Subjective/Interval History:   No new events  Objective Vital signs in last 24 hours: Vitals:   10/23/15 2240 10/24/15 0302 10/24/15 0314 10/24/15 0500  BP: (!) 144/53 (!) 150/65 (!) 150/65   Pulse:   75   Resp: 18 18 15    Temp:   98.1 F (36.7 C)   TempSrc:   Oral   SpO2:   95%   Weight:    60.6 kg (133 lb 9.6 oz)  Height:       Weight change: -2.4 kg (-5 lb 4.7 oz)  Intake/Output Summary (Last 24 hours) at 10/24/15 0738 Last data filed at 10/23/15 2231  Gross per 24 hour  Intake              240 ml  Output             2300 ml  Net            -2060 ml   Physical Exam:  Blood pressure (!) 150/65, pulse 75, temperature 98.1 F (36.7 C), temperature source Oral, resp. rate 15, height 5\' 7"  (1.702 m), weight 60.6 kg (133 lb 9.6 oz), SpO2 95 %.   NAD No JVD Lungs clear Cardiac rhythm regular, normal S1S2 No S3 or S4 RLQ renal allograft firm and nontender No edema Right upper arm AVF with + bruit and thrill (has never been used)    Labs:  Recent Labs Lab 10/18/15 0020 10/19/15 0857 10/20/15 0608 10/21/15 0329 10/22/15 0604 10/23/15 0326 10/24/15 0255  NA 140 142 141 139 141 137 140  K 5.3* 4.4 4.3 4.3 4.6 4.7 4.5  CL 109 109 109 108 113* 110 112*  CO2 24 22 21* 23 22 21* 23  GLUCOSE 171* 87 100* 223* 162* 220* 178*  BUN 44* 39* 36* 34* 29* 25* 22*  CREATININE 1.45* 1.39* 1.46* 1.54* 1.43* 1.33* 1.27*  CALCIUM 9.1 9.2 8.7* 8.6* 8.4* 8.4* 8.6*  PHOS  --   --   --   --   --  3.1 3.4     Recent Labs Lab 10/19/15 0857 10/23/15 0326 10/24/15 0255  AST 27  --   --   ALT 27  --   --   ALKPHOS 103  --   --   BILITOT 0.9  --   --   PROT 7.1  --   --   ALBUMIN 2.7* 2.4* 2.4*     Recent Labs Lab 10/18/15 0020  10/19/15 0857  10/21/15 0329 10/22/15 0604 10/23/15 0326 10/24/15 0255  WBC 9.1  < > 10.1  < > 9.0 7.9 7.1 7.1  NEUTROABS 6.6  --  6.5  --   --   --   --   --   HGB 13.7  < > 13.5  < > 12.5* 12.3* 12.1* 11.9*  HCT 41.0  <  > 42.3  < > 39.1 39.8 39.2 38.3*  MCV 84.4  < > 85.6  < > 86.1 87.5 87.3 86.8  PLT 324  < > 303  < > 290 268 260 256  < > = values in this interval not displayed.   Recent Labs Lab 10/19/15 0857 10/19/15 1533 10/19/15 2105 10/20/15 0608  TROPONINI 0.30* 0.23* 0.23* 0.23*     Recent Labs Lab 10/23/15 0624 10/23/15 1232 10/23/15 1711 10/23/15 2121 10/24/15 0613  GLUCAP 143* 141* 209* 221* 118*   Results for JAVIEN, MULLER (MRN [id number]) as of 10/22/2015 13:02  Ref. Range 10/21/2015  03:29  Cyclosporine, [facility] Latest Ref Range: 100 - 400 ng/mL 722 (H) Drawn 4.5 hours post dose - not a valid result    Medications: . sodium chloride Stopped (10/23/15 0700)  . heparin 800 Units/hr (10/23/15 0700)  . nitroGLYCERIN 10 mcg/min (10/23/15 0700)   . aspirin EC  81 mg Oral Daily  . brimonidine  1 drop Left Eye QHS  . carvedilol  3.125 mg Oral BID  . cycloSPORINE  100 mg Oral BID  . dorzolamide  1 drop Left Eye QHS   And  . timolol  1 drop Left Eye QHS  . fluconazole  150 mg Oral Daily  . insulin aspart  0-9 Units Subcutaneous TID WC  . insulin detemir  10 Units Subcutaneous BID  . latanoprost  1 drop Left Eye QHS  . mycophenolate  180 mg Oral QPM  . pantoprazole  40 mg Oral Q0600  . predniSONE  5 mg Oral Daily  . rosuvastatin  10 mg Oral Daily  . sodium chloride flush  3 mL Intravenous Q12H   Coronary Angio findings: (10/22/15) LVEDP was 25 mmHg Left main is 60-70% distal stenosis LAD has 90-95% ostial in-stent restenosis Diagonal 1 stent is patent Left circumflex as 95% proximal in-stent restenosis High OM1 is patent RCA has 30-40% mid in-stent restenosis distally the vessel is small and diffusely diseased as before.  Patient was transferred to recovery room in stable condition.  Per Dr. Sharyn Lull "We will get CVTS consult and discuss with family again regarding CABG".    Background: 60 year old man of [place] origin with HTN, DM, CAD (s/p multivessel PCI 2016  and prior refusal of CABG), HLD and peripheral neuropathy.  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 Last creatinine noted was 1.1 in June, 2017 and follows with Dr. Annie Sable.  Admitted with SOB, LE edema. With diuresis,  edema and SOB have resolved.   We were consulted to follow along his renal function after the development of AKI on CKD with creatinine of 1.45 after diuretic therapy) in anticipation of coronary angiogram on Tuesday 9/19.  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:  Hemodynamically mediated with CHF exacerbation/diuresis Improving, even in face of contrast exposure for cath on 9/19 Creatinine down to 1.27 today with baseline of 1.1   LM and multivessel CAD  CHF resolved post diuresis Lasix last dosed 9/17 with no recurrence of edema or CHF LVEF 40-45% by ECHO 9/17 with mild MR LM/multivessel  CAD by cath with CABG cheduled for next Monday To remain in house until then On heparin/IV nitro  Hypertension:  Controlled   Insulin-dependent diabetes mellitus:  Fair control  From a renal standpoint, not doing much at this time. I will followup on the repeat cyclosporine level and check on labs but not will formally see daily.  Will resume daily followup next week with CABG.   Camille Bal, MD [facility] [phone] Pager 10/24/2015, 7:38 AM

## 2015-10-24 NOTE — Progress Notes (Signed)
ANTICOAGULATION CONSULT NOTE - Follow up  Pharmacy Consult for Heparin Indication: chest pain/ACS  No Known Allergies  Patient Measurements: Height: 5\' 7"  (170.2 cm) Weight: 138 lb 14.2 oz (63 kg) IBW/kg (Calculated) : 66.1 Heparin Dosing Weight: 63.9 kg  Vital Signs: Temp: 98.1 F (36.7 C) (09/21 0314) Temp Source: Oral (09/21 0314) BP: 150/65 (09/21 0314) Pulse Rate: 75 (09/21 0314)  Labs:  Recent Labs  10/22/15 0604 10/23/15 0326 10/23/15 1414 10/24/15 0255  HGB 12.3* 12.1*  --  11.9*  HCT 39.8 39.2  --  38.3*  PLT 268 260  --  256  HEPARINUNFRC 0.43 <0.10* 0.32 0.41  CREATININE 1.43* 1.33*  --  1.27*    Estimated Creatinine Clearance: 54.4 mL/min (by C-G formula based on SCr of 1.27 mg/dL (H)).   Assessment: 60 y.o male with h/o kidney transplant with SOB, admitted 10/18/15 with + troponin in ED.  Pharmacy consulted for heparin infusion protocol. Patient underwent cardiac catheterization on 9/19 with plan for CABG on 9/25.  -HL this AM 0.41 -CBC WNL and stable -No S/Sx of bleeding  Goal of Therapy:  Heparin level 0.3-0.7 units/ml Monitor platelets by anticoagulation protocol: Yes   Plan:  -Continue heparin at 800 units/hr -Daily heparin level and CBC  Gwyndolyn KaufmanKai Atha Muradyan Bernette Redbird(Kenny), PharmD  PGY1 Pharmacy Resident Pager: [phone] 10/24/2015 6:16 AM

## 2015-10-24 NOTE — Progress Notes (Signed)
Subjective:  Doing well denies any chest pain or shortness of breath.  Objective:  Vital Signs in the last 24 hours: Temp:  [98 F (36.7 C)-98.4 F (36.9 C)] 98.4 F (36.9 C) (09/21 0735) Pulse Rate:  [73-83] 74 (09/21 0735) Resp:  [15-22] 15 (09/21 0735) BP: (144-175)/(53-88) 150/71 (09/21 0735) SpO2:  [94 %-99 %] 99 % (09/21 0735) Weight:  [133 lb 9.6 oz (60.6 kg)] 133 lb 9.6 oz (60.6 kg) (09/21 0500)  Intake/Output from previous day: 09/20 0701 - 09/21 0700 In: 240 [P.O.:240] Out: 2300 [Urine:2300] Intake/Output from this shift: Total I/O In: 360 [P.O.:360] Out: 400 [Urine:400]  Physical Exam: Exam unchanged  Lab Results:  Recent Labs  10/23/15 0326 10/24/15 0255  WBC 7.1 7.1  HGB 12.1* 11.9*  PLT 260 256    Recent Labs  10/23/15 0326 10/24/15 0255  NA 137 140  K 4.7 4.5  CL 110 112*  CO2 21* 23  GLUCOSE 220* 178*  BUN 25* 22*  CREATININE 1.33* 1.27*   No results for input(s): TROPONINI in the last 72 hours.  Invalid input(s): CK, MB Hepatic Function Panel  Recent Labs  10/24/15 0255  ALBUMIN 2.4*   No results for input(s): CHOL in the last 72 hours. No results for input(s): PROTIME in the last 72 hours.  Imaging: Imaging results have been reviewed and No results found.  Cardiac Studies:  Assessment/Plan:  Compensated systolic heart failure Probable small non-Q-wave myocardial infarction Multivessel CAD status post multivessel PCI in the paststatus post left cath with aggressive in-stent restenosis in ostial LAD and proximal left circumflex and distal left main stenosis Hypertension Diabetes mellitus Hyperlipidemia History of end-stage renal disease status post renal transplant Chronic kidney diseaseStage II Anemia of chronic disease Peripheral neuropathy Peripheral vascular disease Depression Plan Continue present management Awaiting CABG  LOS: 5 days    [name], Norman Bier 10/24/2015, 10:31 AM

## 2015-10-25 ENCOUNTER — Inpatient Hospital Stay (HOSPITAL_COMMUNITY): Payer: PPO

## 2015-10-25 ENCOUNTER — Encounter (HOSPITAL_COMMUNITY): Payer: PPO

## 2015-10-25 DIAGNOSIS — Z01810 Encounter for preprocedural cardiovascular examination: Secondary | ICD-10-CM

## 2015-10-25 LAB — RENAL FUNCTION PANEL
Albumin: 2.5 g/dL — ABNORMAL LOW (ref 3.5–5.0)
Anion gap: 8 (ref 5–15)
BUN: 22 mg/dL — ABNORMAL HIGH (ref 6–20)
CALCIUM: 9 mg/dL (ref 8.9–10.3)
CO2: 22 mmol/L (ref 22–32)
Chloride: 108 mmol/L (ref 101–111)
Creatinine, Ser: 1.32 mg/dL — ABNORMAL HIGH (ref 0.61–1.24)
GFR, EST NON AFRICAN AMERICAN: 57 mL/min — AB (ref >60)
Glucose, Bld: 120 mg/dL — ABNORMAL HIGH (ref 65–99)
Phosphorus: 3.9 mg/dL (ref 2.5–4.6)
Potassium: 4.7 mmol/L (ref 3.5–5.1)
SODIUM: 138 mmol/L (ref 135–145)

## 2015-10-25 LAB — GLUCOSE, CAPILLARY
GLUCOSE-CAPILLARY: 80 mg/dL (ref 65–99)
GLUCOSE-CAPILLARY: 133 mg/dL — AB (ref 65–99)
Glucose-Capillary: 100 mg/dL — ABNORMAL HIGH (ref 65–99)
Glucose-Capillary: 187 mg/dL — ABNORMAL HIGH (ref 65–99)

## 2015-10-25 LAB — CBC
HCT: 40.4 % (ref 39.0–52.0)
HEMOGLOBIN: 12.8 g/dL — AB (ref 13.0–17.0)
MCH: 27.6 pg (ref 26.0–34.0)
MCHC: 31.7 g/dL (ref 30.0–36.0)
MCV: 87.1 fL (ref 78.0–100.0)
Platelets: 258 10*3/uL (ref 150–400)
RBC: 4.64 MIL/uL (ref 4.22–5.81)
RDW: 16.6 % — ABNORMAL HIGH (ref 11.5–15.5)
WBC: 7.5 10*3/uL (ref 4.0–10.5)

## 2015-10-25 LAB — HEPARIN LEVEL (UNFRACTIONATED): HEPARIN UNFRACTIONATED: 0.51 [IU]/mL (ref 0.30–0.70)

## 2015-10-25 LAB — CYCLOSPORINE: CYCLOSPORINE, LABCORP: 416 ng/mL — AB (ref 100–400)

## 2015-10-25 MED ORDER — ALBUTEROL SULFATE (2.5 MG/3ML) 0.083% IN NEBU
2.5000 mg | INHALATION_SOLUTION | Freq: Once | RESPIRATORY_TRACT | Status: AC
  Administered 2015-10-25: 08:00:00 2.5 mg via RESPIRATORY_TRACT

## 2015-10-25 NOTE — Progress Notes (Signed)
ANTICOAGULATION CONSULT NOTE - Follow up  Pharmacy Consult for Heparin Indication: chest pain/ACS  No Known Allergies  Patient Measurements: Height: 5\' 7"  (170.2 cm) Weight: 140 lb 11.2 oz (63.8 kg) IBW/kg (Calculated) : 66.1 Heparin Dosing Weight: 63.9 kg  Vital Signs: Temp: 96.7 F (35.9 C) (09/22 0416) Temp Source: Axillary (09/22 0416) BP: 147/59 (09/22 0416) Pulse Rate: 75 (09/22 0416)  Labs:  Recent Labs  10/23/15 0326 10/23/15 1414 10/24/15 0255 10/25/15 0339 10/25/15 0340  HGB 12.1*  --  11.9* 12.8*  --   HCT 39.2  --  38.3* 40.4  --   PLT 260  --  256 258  --   HEPARINUNFRC <0.10* 0.32 0.41  --  0.51  CREATININE 1.33*  --  1.27*  --  1.32*    Estimated Creatinine Clearance: 53 mL/min (by C-G formula based on SCr of 1.32 mg/dL (H)).   Assessment: 60 y.o male with h/o kidney transplant with SOB, admitted 10/18/15 with + troponin in ED.  Pharmacy consulted for heparin infusion protocol. Patient underwent cardiac catheterization on 9/19 with plan for CABG on 9/25.  -HL this AM 0.51 -CBC WNL and stable -No S/Sx of bleeding  Goal of Therapy:  Heparin level 0.3-0.7 units/ml Monitor platelets by anticoagulation protocol: Yes   Plan:  -Continue heparin at 800 units/hr -Daily heparin level and CBC  Gwyndolyn KaufmanKai Nelissa Bolduc Bernette Redbird(Kenny), PharmD  PGY1 Pharmacy Resident Pager: [phone] 10/25/2015 7:18 AM

## 2015-10-25 NOTE — Progress Notes (Signed)
Pre-op Cardiac Surgery  Carotid Findings:  No evidence of significant stenosis in bilateral carotid arteries.  Upper Extremity Right Left  Brachial Pressures AVF 166  Radial Waveforms Triphasic Triphasic  Ulnar Waveforms Atypical Triphasic  Palmar Arch (Allen's Test) Not obtained WNL   Findings:  Right sided not obtained due to AVF. Left  Doppler waveforms appeared within normal limits with compression.    Lower  Extremity Right Left  Dorsalis Pedis 129 157  Posterior Tibial 158 140  Ankle/Brachial Indices 0.95 0.84    Findings:  ABIs are suggestive of mild arterial insufficiency at rest bilaterally. Marilynne Halstedita Dequann Vandervelden, BS, RDMS, RVT

## 2015-10-25 NOTE — Care Management Note (Addendum)
Case Management Note  Patient Details  Name: Steven Riddle MRN: [id number] Date of Birth: February 05, 1955  Subjective/Objective:   S/p heart cath, multivessel CAD with left main stenosis, plan for CABG on Monday.  NCM will cont to follow for dc needs.                              Action/Plan:   Expected Discharge Date:                  Expected Discharge Plan:  Home/Self Care  In-House Referral:     Discharge planning Services  CM Consult  Post Acute Care Choice:    Choice offered to:     DME Arranged:    DME Agency:     HH Arranged:    HH Agency:     Status of Service:  In process, will continue to follow  If discussed at Long Length of Stay Meetings, dates discussed:    Additional Comments:  Leone Havenaylor, Twania Bujak Clinton, RN 10/25/2015, 11:12 AM

## 2015-10-25 NOTE — Progress Notes (Signed)
Subjective:  Doing well.  Denies any chest pain or shortness of breath.  Scheduled for CABG Monday morning  Objective:  Vital Signs in the last 24 hours: Temp:  [96.7 F (35.9 C)-98.2 F (36.8 C)] 97.7 F (36.5 C) (09/22 0854) Pulse Rate:  [73-75] 73 (09/22 0854) Resp:  [16-20] 16 (09/22 0854) BP: (145-165)/(59-77) 165/77 (09/22 0854) SpO2:  [92 %-97 %] 97 % (09/22 0854) Weight:  [140 lb 11.2 oz (63.8 kg)] 140 lb 11.2 oz (63.8 kg) (09/22 0416)  Intake/Output from previous day: 09/21 0701 - 09/22 0700 In: 1024 [P.O.:940; I.V.:84] Out: 1550 [Urine:1550] Intake/Output from this shift: No intake/output data recorded.  Physical Exam: Neck: no adenopathy, no carotid bruit, no JVD and supple, symmetrical, trachea midline Lungs: clear to auscultation bilaterally Heart: regular rate and rhythm, S1, S2 normal and soft systolic murmur noted Abdomen: soft, non-tender; bowel sounds normal; no masses,  no organomegaly Extremities: extremities normal, atraumatic, no cyanosis or edema  Lab Results:  Recent Labs  10/24/15 0255 10/25/15 0339  WBC 7.1 7.5  HGB 11.9* 12.8*  PLT 256 258    Recent Labs  10/24/15 0255 10/25/15 0340  NA 140 138  K 4.5 4.7  CL 112* 108  CO2 23 22  GLUCOSE 178* 120*  BUN 22* 22*  CREATININE 1.27* 1.32*   No results for input(s): TROPONINI in the last 72 hours.  Invalid input(s): CK, MB Hepatic Function Panel  Recent Labs  10/25/15 0340  ALBUMIN 2.5*   No results for input(s): CHOL in the last 72 hours. No results for input(s): PROTIME in the last 72 hours.  Imaging: Imaging results have been reviewed and No results found.  Cardiac Studies:  Assessment/Plan:  Compensated systolic heart failure Probable small non-Q-wave myocardial infarction Multivessel CAD status post multivessel PCI in the paststatus post left cath with aggressive in-stent restenosis in ostial LAD and proximal left circumflex and distal left main  stenosis Hypertension Diabetes mellitus Hyperlipidemia History of end-stage renal disease status post renal transplant Chronic kidney diseaseStage II Anemia of chronic disease Peripheral neuropathy Peripheral vascular disease Depression Plan Continue present management Dr. Algie CofferKadakia on call for weekend  LOS: 6 days    [name], Steven Pleitez 10/25/2015, 10:09 AM

## 2015-10-25 NOTE — Care Management Important Message (Signed)
Important Message  Patient Details  Name: Steven Riddle MRN: [id number] Date of Birth: 06/18/1955   Medicare Important Message Given:  Yes    [name] 10/25/2015, 1:44 PM

## 2015-10-26 LAB — RENAL FUNCTION PANEL
ALBUMIN: 2.4 g/dL — AB (ref 3.5–5.0)
Anion gap: 6 (ref 5–15)
BUN: 20 mg/dL (ref 6–20)
CO2: 20 mmol/L — ABNORMAL LOW (ref 22–32)
CREATININE: 1.29 mg/dL — AB (ref 0.61–1.24)
Calcium: 8.7 mg/dL — ABNORMAL LOW (ref 8.9–10.3)
Chloride: 112 mmol/L — ABNORMAL HIGH (ref 101–111)
GFR calc Af Amer: >60 mL/min (ref >60)
GFR, EST NON AFRICAN AMERICAN: 58 mL/min — AB (ref >60)
Glucose, Bld: 305 mg/dL — ABNORMAL HIGH (ref 65–99)
PHOSPHORUS: 3.4 mg/dL (ref 2.5–4.6)
Potassium: 4.7 mmol/L (ref 3.5–5.1)
Sodium: 138 mmol/L (ref 135–145)

## 2015-10-26 LAB — VAS US DOPPLER PRE CABG
LICADDIAS: -21 cm/s
LICADSYS: -59 cm/s
LICAPDIAS: -12 cm/s
LICAPSYS: -51 cm/s
Left CCA dist dias: 13 cm/s
Left CCA dist sys: 69 cm/s
Left CCA prox dias: -10 cm/s
Left CCA prox sys: -76 cm/s
RCCAPDIAS: 16 cm/s
RCCAPSYS: 75 cm/s
RIGHT VERTEBRAL DIAS: 14 cm/s
Right cca dist sys: -57 cm/s

## 2015-10-26 LAB — CBC
HCT: 40.4 % (ref 39.0–52.0)
HEMOGLOBIN: 12.8 g/dL — AB (ref 13.0–17.0)
MCH: 27.8 pg (ref 26.0–34.0)
MCHC: 31.7 g/dL (ref 30.0–36.0)
MCV: 87.6 fL (ref 78.0–100.0)
PLATELETS: 226 10*3/uL (ref 150–400)
RBC: 4.61 MIL/uL (ref 4.22–5.81)
RDW: 16.6 % — ABNORMAL HIGH (ref 11.5–15.5)
WBC: 7.2 10*3/uL (ref 4.0–10.5)

## 2015-10-26 LAB — GLUCOSE, CAPILLARY
GLUCOSE-CAPILLARY: 63 mg/dL — AB (ref 65–99)
GLUCOSE-CAPILLARY: 102 mg/dL — AB (ref 65–99)
Glucose-Capillary: 311 mg/dL — ABNORMAL HIGH (ref 65–99)
Glucose-Capillary: 59 mg/dL — ABNORMAL LOW (ref 65–99)
Glucose-Capillary: 133 mg/dL — ABNORMAL HIGH (ref 65–99)

## 2015-10-26 LAB — HEPARIN LEVEL (UNFRACTIONATED): HEPARIN UNFRACTIONATED: 0.38 [IU]/mL (ref 0.30–0.70)

## 2015-10-26 MED ORDER — HYDRALAZINE HCL 25 MG PO TABS
25.0000 mg | ORAL_TABLET | Freq: Four times a day (QID) | ORAL | Status: DC
Start: 2015-10-26 — End: 2015-10-28
  Administered 2015-10-26 – 2015-10-27 (×5): 25 mg via ORAL
  Filled 2015-10-26 (×5): qty 1

## 2015-10-26 MED ORDER — INSULIN DETEMIR 100 UNIT/ML ~~LOC~~ SOLN
15.0000 [IU] | Freq: Two times a day (BID) | SUBCUTANEOUS | Status: DC
  Administered 2015-10-26 – 2015-10-27 (×3): 15 [IU] via SUBCUTANEOUS
  Filled 2015-10-26 (×5): qty 0.15

## 2015-10-26 NOTE — Progress Notes (Signed)
Pt transferred to 2w33  From 6C. Pt and wife oriented to room. Pt placed on tele. VSS. No c/o pain. Call bell and phone within reach. Will continue to monitor.

## 2015-10-26 NOTE — Progress Notes (Signed)
Ref: Romero Belling, MD   Subjective:  Feeling better. On IV NTG. Afebrile. Awaiting surgery on Monday. Elevated blood sugars.  Objective:  Vital Signs in the last 24 hours: Temp:  [96.9 F (36.1 C)-98.1 F (36.7 C)] 98.1 F (36.7 C) (09/23 0700) Pulse Rate:  [75-84] 84 (09/23 0700) Cardiac Rhythm: Normal sinus rhythm;Bundle branch block (09/23 0800) Resp:  [16-18] 16 (09/23 0700) BP: (152-166)/(56-74) 164/74 (09/23 0700) SpO2:  [95 %-98 %] 98 % (09/23 0700) Weight:  [64.8 kg (142 lb 12.8 oz)] 64.8 kg (142 lb 12.8 oz) (09/23 0425)  Physical Exam: BP Readings from Last 1 Encounters:  10/26/15 (!) 164/74    Wt Readings from Last 1 Encounters:  10/26/15 64.8 kg (142 lb 12.8 oz)    Weight change: 0.953 kg (2 lb 1.6 oz)  HEENT: [place]/AT, Eyes-Brown, PERL, EOMI, Conjunctiva-Pink, Sclera-Non-icteric Neck: No JVD, No bruit, Trachea midline. Lungs:  Clear, Bilateral. Cardiac:  Regular rhythm, normal S1 and S2, no S3. III/VI systolic murmur Abdomen:  Soft, non-tender. Extremities:  1+ feet edema present. No cyanosis. No clubbing. CNS: AxOx3, Cranial nerves grossly intact, moves all 4 extremities.  Skin: Warm and dry.   Intake/Output from previous day: 09/22 0701 - 09/23 0700 In: 480 [P.O.:480] Out: 2500 [Urine:2500]    Lab Results: BMET    Component Value Date/Time   NA 138 10/26/2015 0601   NA 138 10/25/2015 0340   NA 140 10/24/2015 0255   K 4.7 10/26/2015 0601   K 4.7 10/25/2015 0340   K 4.5 10/24/2015 0255   CL 112 (H) 10/26/2015 0601   CL 108 10/25/2015 0340   CL 112 (H) 10/24/2015 0255   CO2 20 (L) 10/26/2015 0601   CO2 22 10/25/2015 0340   CO2 23 10/24/2015 0255   GLUCOSE 305 (H) 10/26/2015 0601   GLUCOSE 120 (H) 10/25/2015 0340   GLUCOSE 178 (H) 10/24/2015 0255   BUN 20 10/26/2015 0601   BUN 22 (H) 10/25/2015 0340   BUN 22 (H) 10/24/2015 0255   CREATININE 1.29 (H) 10/26/2015 0601   CREATININE 1.32 (H) 10/25/2015 0340   CREATININE 1.27 (H) 10/24/2015 0255   CALCIUM 8.7 (L) 10/26/2015 0601   CALCIUM 9.0 10/25/2015 0340   CALCIUM 8.6 (L) 10/24/2015 0255   CALCIUM 9.5 05/12/2011 1307   CALCIUM 8.8 04/14/2011 1328   CALCIUM 8.6 03/17/2011 1337   GFRNONAA 58 (L) 10/26/2015 0601   GFRNONAA 57 (L) 10/25/2015 0340   GFRNONAA 59 (L) 10/24/2015 0255   GFRAA >60 10/26/2015 0601   GFRAA >60 10/25/2015 0340   GFRAA >60 10/24/2015 0255   CBC    Component Value Date/Time   WBC 7.2 10/26/2015 0601   RBC 4.61 10/26/2015 0601   HGB 12.8 (L) 10/26/2015 0601   HCT 40.4 10/26/2015 0601   PLT 226 10/26/2015 0601   MCV 87.6 10/26/2015 0601   MCH 27.8 10/26/2015 0601   MCHC 31.7 10/26/2015 0601   RDW 16.6 (H) 10/26/2015 0601   LYMPHSABS 2.3 10/19/2015 0857   MONOABS 0.8 10/19/2015 0857   EOSABS 0.4 10/19/2015 0857   BASOSABS 0.1 10/19/2015 0857   HEPATIC Function Panel  Recent Labs  10/19/15 0857  PROT 7.1   HEMOGLOBIN A1C No components found for: HGA1C,  MPG CARDIAC ENZYMES Lab Results  Component Value Date   TROPONINI 0.23 (HH) 10/20/2015   TROPONINI 0.23 (HH) 10/19/2015   TROPONINI 0.23 (HH) 10/19/2015   BNP No results for input(s): PROBNP in the last 8760 hours. TSH  Recent Labs  04/01/15 1533 10/19/15 0857  TSH 2.07 1.597   CHOLESTEROL  Recent Labs  10/20/15 0608  CHOL 136    Scheduled Meds: . aspirin EC  81 mg Oral Daily  . brimonidine  1 drop Left Eye QHS  . carvedilol  3.125 mg Oral BID  . cycloSPORINE  100 mg Oral BID  . dorzolamide  1 drop Left Eye QHS   And  . timolol  1 drop Left Eye QHS  . fluconazole  150 mg Oral Daily  . insulin aspart  0-9 Units Subcutaneous TID WC  . insulin detemir  10 Units Subcutaneous BID  . latanoprost  1 drop Left Eye QHS  . mycophenolate  180 mg Oral QPM  . pantoprazole  40 mg Oral Q0600  . predniSONE  5 mg Oral Daily  . rosuvastatin  10 mg Oral Daily  . sodium chloride flush  3 mL Intravenous Q12H   Continuous Infusions: . sodium chloride 10 mL/hr at 10/26/15 0402  .  heparin 800 Units/hr (10/23/15 0700)  . nitroGLYCERIN 10 mcg/min (10/23/15 0700)   PRN Meds:.sodium chloride, acetaminophen, guaiFENesin-dextromethorphan, nitroGLYCERIN, ondansetron (ZOFRAN) IV, sodium chloride flush  Assessment/Plan: S/P systolic left heart failureMultivessel CAD Small NSTEMI S/P multivessel PCI Hypertension Diabetes Mellitus, II with neuropathy Hyperlipidemia S/P renal transplant for ESRD CKD, II Peripheral vascular disease Depression  Plan: Awaiting surgery. Discussed using incentive spirometer.    LOS: 7 days    Orpah CobbAjay Marybella Ethier  MD  10/26/2015, 10:55 AM

## 2015-10-26 NOTE — Progress Notes (Signed)
ANTICOAGULATION CONSULT NOTE - Follow up  Pharmacy Consult for Heparin Indication: chest pain/ACS  Assessment: 60 y.o male with h/o kidney transplant with SOB, admitted 10/18/15 with + troponin in ED.  Pharmacy consulted for heparin infusion protocol. Patient underwent cardiac catheterization on 9/19 with plan for CABG on 9/25.  -HL this AM 0.38 -CBC WNL and stable -No S/Sx of bleeding  Goal of Therapy:  Heparin level 0.3-0.7 units/ml Monitor platelets by anticoagulation protocol: Yes   Plan:  -Continue heparin at 800 units/hr -Daily heparin level and CBC    Patient Measurements: Height: 5\' 7"  (170.2 cm) Weight: 142 lb 12.8 oz (64.8 kg) IBW/kg (Calculated) : 66.1 Heparin Dosing Weight: 63.9 kg  Vital Signs: Temp: 98.1 F (36.7 C) (09/23 0700) Temp Source: Oral (09/23 0700) BP: 164/74 (09/23 0700) Pulse Rate: 84 (09/23 0700)  Labs:  Recent Labs  10/24/15 0255 10/25/15 0339 10/25/15 0340 10/26/15 0601  HGB 11.9* 12.8*  --  12.8*  HCT 38.3* 40.4  --  40.4  PLT 256 258  --  226  HEPARINUNFRC 0.41  --  0.51 0.38  CREATININE 1.27*  --  1.32* 1.29*    Estimated Creatinine Clearance: 55.1 mL/min (by C-G formula based on SCr of 1.29 mg/dL (H)).  Allie BossierApryl Anderson, PharmD PGY1 Pharmacy Resident [phone] (Pager) 10/26/2015 10:50 AM

## 2015-10-26 NOTE — Plan of Care (Signed)
Problem: Food- and Nutrition-Related Knowledge Deficit (NB-1.1) Goal: Nutrition education Formal process to instruct or train a patient/client in a skill or to impart knowledge to help patients/clients voluntarily manage or modify food choices and eating behavior to maintain or improve health. Outcome: Adequate for Discharge Nutrition Education Note  RD consulted for nutrition education regarding a Heart Healthy/ DM diet. Patient is scheduled for surgery on Monday.  Lipid Panel     Component Value Date/Time   CHOL 136 10/20/2015 0608   TRIG 101 10/20/2015 0608   HDL 29 (L) 10/20/2015 0608   CHOLHDL 4.7 10/20/2015 0608   VLDL 20 10/20/2015 0608   LDLCALC 87 10/20/2015 0608    RD provided "Eating for your heart health" handout . Reviewed patient's dietary recall. He seldom eats out and his diet is high in vegetables, fish, lamb and chicken. He doesn't eat pork due to his Muslim faith. Nor does he eat beef. They use olive oil or canola oil when sauteing and like plain yogurt. There is usually rice or bread at each meals.  He usually eats an egg whites or boil egg with plain english muffin, and almonds. Hot tea to drink.    Provided examples on ways to decrease sodium and fat intake in diet. Pt doesn't add salt to his foods. He does use a NO Salt product sometimes. Encouraged fresh fruits and vegetables as well as whole grain sources of carbohydrates to maximize fiber intake. Teach back method used.  Expect good compliance. Patient is very motivated to eat healthfully and is already consistently following sound nutrition principles.  Body mass index is 22.37 kg/m. Pt meets criteria for normal based on current BMI.  Current diet order is Heart Healthy/CHO Modified, patient is consuming approximately 50% of meals at this time. Labs and medications reviewed. No further nutrition interventions warranted at this time. RD contact information provided. If additional nutrition issues arise, please  re-consult RD.  ,Royann ShiversLynn Arrionna Serena MS,RD,CSG,LDN Office: [phone] Pager: [phone]

## 2015-10-27 ENCOUNTER — Inpatient Hospital Stay (HOSPITAL_COMMUNITY): Payer: PPO

## 2015-10-27 DIAGNOSIS — I25110 Atherosclerotic heart disease of native coronary artery with unstable angina pectoris: Secondary | ICD-10-CM

## 2015-10-27 LAB — BLOOD GAS, ARTERIAL
Acid-base deficit: 3.9 mmol/L — ABNORMAL HIGH (ref 0.0–2.0)
BICARBONATE: 19.9 mmol/L — AB (ref 20.0–28.0)
Drawn by: 406621
O2 CONTENT: 21 L/min
O2 Saturation: 94 %
PH ART: 7.412 (ref 7.350–7.450)
Patient temperature: 98.6
pCO2 arterial: 32 mmHg (ref 32.0–48.0)
pO2, Arterial: 72 mmHg — ABNORMAL LOW (ref 83.0–108.0)

## 2015-10-27 LAB — GLUCOSE, CAPILLARY
GLUCOSE-CAPILLARY: 120 mg/dL — AB (ref 65–99)
GLUCOSE-CAPILLARY: 322 mg/dL — AB (ref 65–99)
GLUCOSE-CAPILLARY: 159 mg/dL — AB (ref 65–99)
Glucose-Capillary: 100 mg/dL — ABNORMAL HIGH (ref 65–99)

## 2015-10-27 LAB — CBC
HCT: 39.4 % (ref 39.0–52.0)
Hemoglobin: 12.3 g/dL — ABNORMAL LOW (ref 13.0–17.0)
MCH: 27 pg (ref 26.0–34.0)
MCHC: 31.2 g/dL (ref 30.0–36.0)
MCV: 86.4 fL (ref 78.0–100.0)
PLATELETS: 226 10*3/uL (ref 150–400)
RBC: 4.56 MIL/uL (ref 4.22–5.81)
RDW: 16.6 % — AB (ref 11.5–15.5)
WBC: 6.7 10*3/uL (ref 4.0–10.5)

## 2015-10-27 LAB — RENAL FUNCTION PANEL
ALBUMIN: 2.4 g/dL — AB (ref 3.5–5.0)
Anion gap: 8 (ref 5–15)
BUN: 19 mg/dL (ref 6–20)
CALCIUM: 8.8 mg/dL — AB (ref 8.9–10.3)
CO2: 20 mmol/L — ABNORMAL LOW (ref 22–32)
Chloride: 110 mmol/L (ref 101–111)
Creatinine, Ser: 1.27 mg/dL — ABNORMAL HIGH (ref 0.61–1.24)
GFR calc Af Amer: >60 mL/min (ref >60)
GFR calc non Af Amer: 59 mL/min — ABNORMAL LOW (ref >60)
GLUCOSE: 129 mg/dL — AB (ref 65–99)
PHOSPHORUS: 3.6 mg/dL (ref 2.5–4.6)
POTASSIUM: 4.7 mmol/L (ref 3.5–5.1)
SODIUM: 138 mmol/L (ref 135–145)

## 2015-10-27 LAB — APTT: APTT: 164 s — AB (ref 24–36)

## 2015-10-27 LAB — PROTIME-INR
INR: 1.12
Prothrombin Time: 14.4 seconds (ref 11.4–15.2)

## 2015-10-27 LAB — ABO/RH: ABO/RH(D): A NEG

## 2015-10-27 LAB — HEPARIN LEVEL (UNFRACTIONATED): Heparin Unfractionated: 0.39 IU/mL (ref 0.30–0.70)

## 2015-10-27 MED ORDER — SODIUM CHLORIDE 0.9 % IV SOLN
INTRAVENOUS | Status: AC
  Administered 2015-10-28: 69.8 mL/h via INTRAVENOUS
  Filled 2015-10-27: qty 40

## 2015-10-27 MED ORDER — DIAZEPAM 5 MG PO TABS
5.0000 mg | ORAL_TABLET | Freq: Once | ORAL | Status: AC
  Administered 2015-10-28: 5 mg via ORAL
  Filled 2015-10-27: qty 1

## 2015-10-27 MED ORDER — DEXTROSE 5 % IV SOLN
1.5000 g | INTRAVENOUS | Status: AC
  Administered 2015-10-28: 1.5 g via INTRAVENOUS
  Administered 2015-10-28: .75 g via INTRAVENOUS
  Filled 2015-10-27: qty 1.5

## 2015-10-27 MED ORDER — NITROGLYCERIN IN D5W 200-5 MCG/ML-% IV SOLN
2.0000 ug/min | INTRAVENOUS | Status: DC
  Filled 2015-10-27: qty 250

## 2015-10-27 MED ORDER — EPINEPHRINE HCL 1 MG/ML IJ SOLN
0.0000 ug/min | INTRAMUSCULAR | Status: DC
  Filled 2015-10-27: qty 4

## 2015-10-27 MED ORDER — TEMAZEPAM 15 MG PO CAPS: 15.0000 mg | ORAL_CAPSULE | Freq: Once | ORAL | Status: DC | PRN

## 2015-10-27 MED ORDER — SODIUM CHLORIDE 0.9 % IV SOLN
INTRAVENOUS | Status: DC
  Filled 2015-10-27: qty 30

## 2015-10-27 MED ORDER — METOPROLOL TARTRATE 12.5 MG HALF TABLET
12.5000 mg | ORAL_TABLET | Freq: Once | ORAL | Status: AC
  Administered 2015-10-28: 12.5 mg via ORAL
  Filled 2015-10-27: qty 1

## 2015-10-27 MED ORDER — CHLORHEXIDINE GLUCONATE CLOTH 2 % EX PADS
6.0000 | MEDICATED_PAD | Freq: Once | CUTANEOUS | Status: AC
  Administered 2015-10-28: 6 via TOPICAL

## 2015-10-27 MED ORDER — POTASSIUM CHLORIDE 2 MEQ/ML IV SOLN
80.0000 meq | INTRAVENOUS | Status: DC
  Filled 2015-10-27: qty 40

## 2015-10-27 MED ORDER — SODIUM CHLORIDE 0.9 % IV SOLN
INTRAVENOUS | Status: AC
  Administered 2015-10-28: 1.1 [IU]/h via INTRAVENOUS
  Filled 2015-10-27: qty 2.5

## 2015-10-27 MED ORDER — DOPAMINE-DEXTROSE 3.2-5 MG/ML-% IV SOLN
0.0000 ug/kg/min | INTRAVENOUS | Status: AC
  Administered 2015-10-28: 3 ug/kg/min via INTRAVENOUS
  Filled 2015-10-27: qty 250

## 2015-10-27 MED ORDER — MAGNESIUM SULFATE 50 % IJ SOLN
40.0000 meq | INTRAMUSCULAR | Status: DC
  Filled 2015-10-27: qty 10

## 2015-10-27 MED ORDER — PLASMA-LYTE 148 IV SOLN
INTRAVENOUS | Status: AC
  Administered 2015-10-28: 500 mL
  Filled 2015-10-27: qty 2.5

## 2015-10-27 MED ORDER — ALPRAZOLAM 0.25 MG PO TABS: 0.2500 mg | ORAL_TABLET | ORAL | Status: DC | PRN

## 2015-10-27 MED ORDER — VANCOMYCIN HCL 10 G IV SOLR
1250.0000 mg | INTRAVENOUS | Status: AC
  Administered 2015-10-28: 1250 mg via INTRAVENOUS
  Filled 2015-10-27: qty 1250

## 2015-10-27 MED ORDER — DEXMEDETOMIDINE HCL IN NACL 400 MCG/100ML IV SOLN
0.1000 ug/kg/h | INTRAVENOUS | Status: AC
  Administered 2015-10-28: 0.7 ug/kg/h via INTRAVENOUS
  Filled 2015-10-27: qty 100

## 2015-10-27 MED ORDER — ZOLPIDEM TARTRATE 5 MG PO TABS
5.0000 mg | ORAL_TABLET | Freq: Every day | ORAL | Status: AC
  Administered 2015-10-27: 5 mg via ORAL
  Filled 2015-10-27: qty 1

## 2015-10-27 MED ORDER — PHENYLEPHRINE HCL 10 MG/ML IJ SOLN
30.0000 ug/min | INTRAVENOUS | Status: DC
  Administered 2015-10-28: 25 ug/min via INTRAVENOUS
  Filled 2015-10-27: qty 2

## 2015-10-27 MED ORDER — CHLORHEXIDINE GLUCONATE 0.12 % MT SOLN
15.0000 mL | Freq: Once | OROMUCOSAL | Status: AC
  Administered 2015-10-28: 15 mL via OROMUCOSAL
  Filled 2015-10-27: qty 15

## 2015-10-27 MED ORDER — CYCLOSPORINE 25 MG PO CAPS
75.0000 mg | ORAL_CAPSULE | Freq: Two times a day (BID) | ORAL | Status: DC
  Administered 2015-10-27 – 2015-11-02 (×11): 75 mg via ORAL
  Filled 2015-10-27 (×13): qty 3

## 2015-10-27 MED ORDER — DEXTROSE 5 % IV SOLN
750.0000 mg | INTRAVENOUS | Status: DC
  Filled 2015-10-27: qty 750

## 2015-10-27 NOTE — Progress Notes (Signed)
5 Days Post-Op Procedure(s) (LRB): Left Heart Cath and Coronary Angiography (N/A) Subjective:  No chest pain or shortness of breath  Objective: Vital signs in last 24 hours: Temp:  [97.7 F (36.5 C)-98.7 F (37.1 C)] 97.7 F (36.5 C) (09/24 1304) Pulse Rate:  [74-76] 76 (09/24 1304) Cardiac Rhythm: Normal sinus rhythm;Bundle branch block (09/23 1900) Resp:  [17-18] 18 (09/24 1304) BP: (139-156)/(67-78) 140/78 (09/24 1304) SpO2:  [95 %-98 %] 95 % (09/24 1304)  Hemodynamic parameters for last 24 hours:    Intake/Output from previous day: 09/23 0701 - 09/24 0700 In: 1453.8 [P.O.:600; I.V.:853.8] Out: 700 [Stool:700] Intake/Output this shift: No intake/output data recorded.  General appearance: alert and cooperative Heart: regular rate and rhythm, S1, S2 normal, 2/6 systolic murmur RSB Lungs: clear to auscultation bilaterally  Lab Results:  Recent Labs  10/26/15 0601 10/27/15 0321  WBC 7.2 6.7  HGB 12.8* 12.3*  HCT 40.4 39.4  PLT 226 226   BMET:  Recent Labs  10/26/15 0601 10/27/15 0321  NA 138 138  K 4.7 4.7  CL 112* 110  CO2 20* 20*  GLUCOSE 305* 129*  BUN 20 19  CREATININE 1.29* 1.27*  CALCIUM 8.7* 8.8*    PT/INR: No results for input(s): LABPROT, INR in the last 72 hours. ABG    Component Value Date/Time   PHART 7.509 (H) 06/01/2014 0424   HCO3 25.5 (H) 06/01/2014 0424   TCO2 26.5 06/01/2014 0424   ACIDBASEDEF 2.0 05/31/2014 0405   O2SAT 96.2 06/01/2014 0424   CBG (last 3)   Recent Labs  10/26/15 2043 10/27/15 0609 10/27/15 1132  GLUCAP 133* 100* 120*    Assessment/Plan:  He is stable for CABG in the am. I discussed the operative procedure with the patient and family including alternatives, benefits and risks; including but not limited to bleeding, blood transfusion, infection, stroke, myocardial infarction, graft failure, heart block requiring a permanent pacemaker, organ dysfunction, and death.  Steven Riddle understands and agrees to  proceed.     LOS: 8 days    Steven Riddle 10/27/2015

## 2015-10-27 NOTE — Anesthesia Preprocedure Evaluation (Addendum)
Anesthesia Evaluation  Patient identified by MRN, date of birth, ID band Patient awake    Reviewed: Allergy & Precautions, NPO status , Patient's Chart, lab work & pertinent test results, reviewed documented beta blocker date and time   Airway Mallampati: II  TM Distance: >3 FB Neck ROM: Full    Dental  (+) Teeth Intact, Dental Advisory Given   Pulmonary neg pulmonary ROS,    Pulmonary exam normal breath sounds clear to auscultation       Cardiovascular hypertension, Pt. on medications and Pt. on home beta blockers + CAD, + Past MI, + Cardiac Stents ( s/p PCI/stenting of the LCX  on 06/04/2014, stenting of the LAD/D on 06/07/2014, stenting of the RCA on 09/06/2014), + Peripheral Vascular Disease and +CHF  Normal cardiovascular exam Rhythm:Regular Rate:Normal  Echo 10/20/15: Study Conclusions  - Left ventricle: The cavity size was normal. Systolic function was mildly to moderately reduced. The estimated ejection fraction was in the range of 40% to 45%. Diffuse hypokinesis. - Aortic valve: There was mild to moderate stenosis. Valve area (VTI): 0.91 cm^2. Valve area (Vmax): 0.95 cm^2. Valve area (Vmean): 0.95 cm^2. - Mitral valve: There was mild regurgitation. - Left atrium: The atrium was mildly dilated. - Pulmonary arteries: PA peak pressure: 45 mm Hg (S).   Neuro/Psych PSYCHIATRIC DISORDERS Anxiety Depression  Neuromuscular disease    GI/Hepatic negative GI ROS, Neg liver ROS,   Endo/Other  diabetes, Poorly Controlled, Insulin Dependent  Renal/GU Renal InsufficiencyRenal disease (s/p kidney tx)     Musculoskeletal negative musculoskeletal ROS (+)   Abdominal   Peds  Hematology  (+) Blood dyscrasia, anemia ,   Anesthesia Other Findings Day of surgery medications reviewed with the patient.  Reproductive/Obstetrics                            Anesthesia Physical Anesthesia Plan  ASA: IV  Anesthesia  Plan: General   Post-op Pain Management:    Induction: Intravenous  Airway Management Planned: Oral ETT  Additional Equipment: Arterial line, TEE, CVP, PA Cath and Ultrasound Guidance Line Placement  Intra-op Plan:   Post-operative Plan: Post-operative intubation/ventilation  Informed Consent: I have reviewed the patients History and Physical, chart, labs and discussed the procedure including the risks, benefits and alternatives for the proposed anesthesia with the patient or authorized representative who has indicated his/her understanding and acceptance.   Dental advisory given  Plan Discussed with: CRNA  Anesthesia Plan Comments: (Risks/benefits of general anesthesia discussed with patient including risk of damage to teeth, lips, gum, and tongue, nausea/vomiting, allergic reactions to medications, and the possibility of heart attack, stroke and death.  All patient questions answered.  Patient wishes to proceed.)        Anesthesia Quick Evaluation

## 2015-10-27 NOTE — Progress Notes (Signed)
Ref: Romero Belling, MD   Subjective:  Accepts sleeping pill for tonight but does not need anxiolytic for day time. No chest pain. Ready for surgery tomorrow.  Objective:  Vital Signs in the last 24 hours: Temp:  [97.7 F (36.5 C)-98.7 F (37.1 C)] 97.7 F (36.5 C) (09/24 1304) Pulse Rate:  [74-76] 76 (09/24 1304) Cardiac Rhythm: Normal sinus rhythm;Bundle branch block (09/23 1900) Resp:  [17-18] 18 (09/24 1304) BP: (139-156)/(67-78) 140/78 (09/24 1304) SpO2:  [95 %-98 %] 95 % (09/24 1304)  Physical Exam: BP Readings from Last 1 Encounters:  10/27/15 140/78    Wt Readings from Last 1 Encounters:  10/26/15 64.8 kg (142 lb 12.8 oz)    Weight change:   HEENT: [place]/AT, Eyes-Brown, PERL, EOMI, Conjunctiva-Pink, Sclera-Non-icteric Neck: No JVD, No bruit, Trachea midline. Lungs:  Clear, Bilateral. Cardiac:  Regular rhythm, normal S1 and S2, no S3. III/VI systolic murmur. Abdomen:  Soft, non-tender. Extremities:  Trace edema present. No cyanosis. No clubbing. CNS: AxOx3, Cranial nerves grossly intact, moves all 4 extremities.  Skin: Warm and dry.   Intake/Output from previous day: 09/23 0701 - 09/24 0700 In: 1453.8 [P.O.:600; I.V.:853.8] Out: 700 [Stool:700]    Lab Results: BMET    Component Value Date/Time   NA 138 10/27/2015 0321   NA 138 10/26/2015 0601   NA 138 10/25/2015 0340   K 4.7 10/27/2015 0321   K 4.7 10/26/2015 0601   K 4.7 10/25/2015 0340   CL 110 10/27/2015 0321   CL 112 (H) 10/26/2015 0601   CL 108 10/25/2015 0340   CO2 20 (L) 10/27/2015 0321   CO2 20 (L) 10/26/2015 0601   CO2 22 10/25/2015 0340   GLUCOSE 129 (H) 10/27/2015 0321   GLUCOSE 305 (H) 10/26/2015 0601   GLUCOSE 120 (H) 10/25/2015 0340   BUN 19 10/27/2015 0321   BUN 20 10/26/2015 0601   BUN 22 (H) 10/25/2015 0340   CREATININE 1.27 (H) 10/27/2015 0321   CREATININE 1.29 (H) 10/26/2015 0601   CREATININE 1.32 (H) 10/25/2015 0340   CALCIUM 8.8 (L) 10/27/2015 0321   CALCIUM 8.7 (L) 10/26/2015  0601   CALCIUM 9.0 10/25/2015 0340   CALCIUM 9.5 05/12/2011 1307   CALCIUM 8.8 04/14/2011 1328   CALCIUM 8.6 03/17/2011 1337   GFRNONAA 59 (L) 10/27/2015 0321   GFRNONAA 58 (L) 10/26/2015 0601   GFRNONAA 57 (L) 10/25/2015 0340   GFRAA >60 10/27/2015 0321   GFRAA >60 10/26/2015 0601   GFRAA >60 10/25/2015 0340   CBC    Component Value Date/Time   WBC 6.7 10/27/2015 0321   RBC 4.56 10/27/2015 0321   HGB 12.3 (L) 10/27/2015 0321   HCT 39.4 10/27/2015 0321   PLT 226 10/27/2015 0321   MCV 86.4 10/27/2015 0321   MCH 27.0 10/27/2015 0321   MCHC 31.2 10/27/2015 0321   RDW 16.6 (H) 10/27/2015 0321   LYMPHSABS 2.3 10/19/2015 0857   MONOABS 0.8 10/19/2015 0857   EOSABS 0.4 10/19/2015 0857   BASOSABS 0.1 10/19/2015 0857   HEPATIC Function Panel  Recent Labs  10/19/15 0857  PROT 7.1   HEMOGLOBIN A1C No components found for: HGA1C,  MPG CARDIAC ENZYMES Lab Results  Component Value Date   TROPONINI 0.23 (HH) 10/20/2015   TROPONINI 0.23 (HH) 10/19/2015   TROPONINI 0.23 (HH) 10/19/2015   BNP No results for input(s): PROBNP in the last 8760 hours. TSH  Recent Labs  04/01/15 1533 10/19/15 0857  TSH 2.07 1.597   CHOLESTEROL  Recent Labs  10/20/15 0608  CHOL 136    Scheduled Meds: . aspirin EC  81 mg Oral Daily  . brimonidine  1 drop Left Eye QHS  . carvedilol  3.125 mg Oral BID  . cycloSPORINE  75 mg Oral BID  . dorzolamide  1 drop Left Eye QHS   And  . timolol  1 drop Left Eye QHS  . fluconazole  150 mg Oral Daily  . hydrALAZINE  25 mg Oral QID  . insulin aspart  0-9 Units Subcutaneous TID WC  . insulin detemir  15 Units Subcutaneous BID  . latanoprost  1 drop Left Eye QHS  . mycophenolate  180 mg Oral QPM  . pantoprazole  40 mg Oral Q0600  . predniSONE  5 mg Oral Daily  . rosuvastatin  10 mg Oral Daily  . sodium chloride flush  3 mL Intravenous Q12H  . zolpidem  5 mg Oral QHS   Continuous Infusions: . sodium chloride 10 mL/hr at 10/26/15 0402  .  heparin 800 Units/hr (10/23/15 0700)  . nitroGLYCERIN 10 mcg/min (10/23/15 0700)   PRN Meds:.sodium chloride, acetaminophen, guaiFENesin-dextromethorphan, nitroGLYCERIN, ondansetron (ZOFRAN) IV, sodium chloride flush  Assessment/Plan: Multivessel CAD S/P left systolic heart failure S/P multivessel PCI Hypertension DM, II with neuropathy CKD II S/P renal transplant Peripheral vascular disease Depression  Ambien for tonight. CABG in AM.   LOS: 8 days    Orpah CobbAjay Lakena Sparlin  MD  10/27/2015, 2:08 PM

## 2015-10-27 NOTE — Progress Notes (Signed)
[facility] Rounding Note  Subjective/Interval History:  For CABG tomorrow Feels fine No CP or SOB  Objective Vital signs in last 24 hours: Vitals:   10/26/15 1126 10/26/15 1807 10/26/15 1955 10/27/15 0556  BP: (!) 164/85 139/67 (!) 153/76 (!) 156/72  Pulse: 79  76 74  Resp:   18 17  Temp: 98.3 F (36.8 C)  98.1 F (36.7 C) 98.7 F (37.1 C)  TempSrc: Oral  Oral Oral  SpO2: 100%  98% 96%  Weight:      Height:       Physical Exam:  Blood pressure (!) 156/72, pulse 74, temperature 98.7 F (37.1 C), temperature source Oral, resp. rate 17, height 5\' 7"  (1.702 m), weight 64.8 kg (142 lb 12.8 oz), SpO2 96 %.   Lying in bed - wife with him NAD No JVD supine Lungs clear Cardiac rhythm regular, normal S1S2  No S3 or S4 RLQ renal allograft firm and nontender No edema of the LE's Right upper arm BC AVF (01/08/10 Dr. Hart RochesterLawson)  + bruit and thrill (has never been used)    Labs:  Recent Labs Lab 10/21/15 0329 10/22/15 0604 10/23/15 0326 10/24/15 0255 10/25/15 0340 10/26/15 0601 10/27/15 0321  NA 139 141 137 140 138 138 138  K 4.3 4.6 4.7 4.5 4.7 4.7 4.7  CL 108 113* 110 112* 108 112* 110  CO2 23 22 21* 23 22 20* 20*  GLUCOSE 223* 162* 220* 178* 120* 305* 129*  BUN 34* 29* 25* 22* 22* 20 19  CREATININE 1.54* 1.43* 1.33* 1.27* 1.32* 1.29* 1.27*  CALCIUM 8.6* 8.4* 8.4* 8.6* 9.0 8.7* 8.8*  PHOS  --   --  3.1 3.4 3.9 3.4 3.6     Recent Labs Lab 10/25/15 0340 10/26/15 0601 10/27/15 0321  ALBUMIN 2.5* 2.4* 2.4*     Recent Labs Lab 10/24/15 0255 10/25/15 0339 10/26/15 0601 10/27/15 0321  WBC 7.1 7.5 7.2 6.7  HGB 11.9* 12.8* 12.8* 12.3*  HCT 38.3* 40.4 40.4 39.4  MCV 86.8 87.1 87.6 86.4  PLT 256 258 226 226     Recent Labs Lab 10/26/15 1129 10/26/15 1149 10/26/15 1608 10/26/15 2043 10/27/15 0609  GLUCAP 59* 63* 102* 133* 100*    Results for Beckie SaltsKHAN, AMAAH (MRN [id number]) as of 10/27/2015 08:1   10/21/2015 03:29 10/24/2015 09:06  Cyclosporine, [facility] 722 (H)  Drawn 4.5 hours post dose - not true trough 416 (H) Drawn 11 hours post dose - true trough    Medications:  sodium chloride 10 mL/hr at 10/26/15 0402   heparin 800 Units/hr (10/23/15 0700)   nitroGLYCERIN 10 mcg/min (10/23/15 0700)    aspirin EC  81 mg Oral Daily   brimonidine  1 drop Left Eye QHS   carvedilol  3.125 mg Oral BID   cycloSPORINE  100 mg Oral BID   dorzolamide  1 drop Left Eye QHS   And   timolol  1 drop Left Eye QHS   fluconazole  150 mg Oral Daily   hydrALAZINE  25 mg Oral QID   insulin aspart  0-9 Units Subcutaneous TID WC   insulin detemir  15 Units Subcutaneous BID   latanoprost  1 drop Left Eye QHS   mycophenolate  180 mg Oral QPM   pantoprazole  40 mg Oral Q0600   predniSONE  5 mg Oral Daily   rosuvastatin  10 mg Oral Daily   sodium chloride flush  3 mL Intravenous Q12H   Coronary Angio: (10/22/15) LVEDP was 25 mmHg  Left main is 60-70% distal stenosis LAD has 90-95% ostial in-stent restenosis Diagonal 1 stent is patent Left circumflex as 95% proximal in-stent restenosis High OM1 is patent RCA has 30-40% mid in-stent restenosis distally the vessel is small and diffusely diseased as before.    Background: 60 year old [place] man with HTN, DM, CAD, HLD and peripheral neuropathy.  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 Last creatinine PTA 1.1  June, 2017 (Nephrologist  Dr. Kathrene Bongo). Admitted with pulm edema, diuresed, found to have low EF on ECHO -> cath->multivessel and LM disease -> for CABG 9/25. We were consulted to follow renal function . AKI on CKD with creatinine of 1.45 2/2 diuresis.  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:  Mild hemodynamically mediated with CHF exacerbation/diuresis Improved, even in face of contrast  exposure for cath on 9/19 Currently stable 1.27-1.29 On CyA, prednisone and mycophenolate, with fluconazole daily to boost levels Tru CyA trough high on 100 mg BID at 416 - reduce to 75 mg BID and follow levels  NSTEMI/LM and multivessel CAD by cath 9/19 CHF resolved post diuresis Lasix last dosed 9/17 with no recurrence of edema or CHF LVEF 40-45% by ECHO 9/17 with mild MR/diffuse hypokineses LM/multivessel  CAD by cath  For CABG 9/25  On heparin/IV nitro  Insulin-dependent diabetes mellitus:  Per primary service   Camille Bal, MD [facility] [phone] Pager 10/27/2015, 8:18 AM

## 2015-10-27 NOTE — Progress Notes (Signed)
ANTICOAGULATION CONSULT NOTE - Follow up  Pharmacy Consult for Heparin Indication: chest pain/ACS  Assessment: 60 y.o male with h/o kidney transplant with SOB, admitted 10/18/15 with + troponin in ED.  Pharmacy consulted for heparin infusion protocol. Patient underwent cardiac catheterization on 9/19 with plan for CABG on 9/25.  -HL this AM 0.39 - remains therapeutic -CBC stable -No S/Sx of bleeding  Goal of Therapy:  Heparin level 0.3-0.7 units/ml Monitor platelets by anticoagulation protocol: Yes   Plan:  -Continue heparin at 800 units/hr -Daily heparin level and CBC -Monitor for s/sx bleeding    Patient Measurements: Height: 5\' 7"  (170.2 cm) Weight: 142 lb 12.8 oz (64.8 kg) IBW/kg (Calculated) : 66.1 Heparin Dosing Weight: 63.9 kg  Vital Signs: Temp: 98.7 F (37.1 C) (09/24 0556) Temp Source: Oral (09/24 0556) BP: 144/67 (09/24 0932) Pulse Rate: 74 (09/24 0556)  Labs:  Recent Labs  10/25/15 0339 10/25/15 0340 10/26/15 0601 10/27/15 0321  HGB 12.8*  --  12.8* 12.3*  HCT 40.4  --  40.4 39.4  PLT 258  --  226 226  HEPARINUNFRC  --  0.51 0.38 0.39  CREATININE  --  1.32* 1.29* 1.27*    Estimated Creatinine Clearance: 56 mL/min (by C-G formula based on SCr of 1.27 mg/dL (H)).  Babs BertinHaley Konnar Ben, PharmD, BCPS Clinical Pharmacist Pager [phone] 10/27/2015 10:40 AM

## 2015-10-28 ENCOUNTER — Encounter (HOSPITAL_COMMUNITY)
Admission: EM | Disposition: A | Discharge status: Home / Self Care | Payer: Self-pay | Source: Home / Self Care | Attending: Cardiology

## 2015-10-28 ENCOUNTER — Inpatient Hospital Stay (HOSPITAL_COMMUNITY): Payer: PPO | Admitting: Anesthesiology

## 2015-10-28 ENCOUNTER — Inpatient Hospital Stay (HOSPITAL_COMMUNITY): Payer: PPO

## 2015-10-28 ENCOUNTER — Encounter (HOSPITAL_COMMUNITY): Payer: Self-pay | Admitting: Anesthesiology

## 2015-10-28 HISTORY — PX: TEE WITHOUT CARDIOVERSION: SHX5443

## 2015-10-28 HISTORY — PX: CORONARY ARTERY BYPASS GRAFT: SHX141

## 2015-10-28 LAB — POCT I-STAT, CHEM 8
BUN: 22 mg/dL — AB (ref 6–20)
BUN: 20 mg/dL (ref 6–20)
BUN: 14 mg/dL (ref 6–20)
BUN: 19 mg/dL (ref 6–20)
BUN: 20 mg/dL (ref 6–20)
CHLORIDE: 110 mmol/L (ref 101–111)
CHLORIDE: 106 mmol/L (ref 101–111)
CREATININE: 1.1 mg/dL (ref 0.61–1.24)
CREATININE: 1 mg/dL (ref 0.61–1.24)
CREATININE: 1.2 mg/dL (ref 0.61–1.24)
Calcium, Ion: 1.29 mmol/L (ref 1.15–1.40)
Calcium, Ion: 1.19 mmol/L (ref 1.15–1.40)
Calcium, Ion: 0.87 mmol/L — CL (ref 1.15–1.40)
Calcium, Ion: 1.1 mmol/L — ABNORMAL LOW (ref 1.15–1.40)
Calcium, Ion: 1.17 mmol/L (ref 1.15–1.40)
Chloride: 108 mmol/L (ref 101–111)
Chloride: 107 mmol/L (ref 101–111)
Chloride: 113 mmol/L — ABNORMAL HIGH (ref 101–111)
Creatinine, Ser: 0.9 mg/dL (ref 0.61–1.24)
Creatinine, Ser: 0.6 mg/dL — ABNORMAL LOW (ref 0.61–1.24)
GLUCOSE: 119 mg/dL — AB (ref 65–99)
GLUCOSE: 78 mg/dL (ref 65–99)
Glucose, Bld: 106 mg/dL — ABNORMAL HIGH (ref 65–99)
Glucose, Bld: 79 mg/dL (ref 65–99)
Glucose, Bld: 147 mg/dL — ABNORMAL HIGH (ref 65–99)
HCT: 25 % — ABNORMAL LOW (ref 39.0–52.0)
HEMATOCRIT: 36 % — AB (ref 39.0–52.0)
HEMATOCRIT: 29 % — AB (ref 39.0–52.0)
HEMATOCRIT: 24 % — AB (ref 39.0–52.0)
HEMATOCRIT: 38 % — AB (ref 39.0–52.0)
HEMOGLOBIN: 9.9 g/dL — AB (ref 13.0–17.0)
HEMOGLOBIN: 8.5 g/dL — AB (ref 13.0–17.0)
HEMOGLOBIN: 12.9 g/dL — AB (ref 13.0–17.0)
Hemoglobin: 12.2 g/dL — ABNORMAL LOW (ref 13.0–17.0)
Hemoglobin: 8.2 g/dL — ABNORMAL LOW (ref 13.0–17.0)
POTASSIUM: 4.2 mmol/L (ref 3.5–5.1)
POTASSIUM: 3.8 mmol/L (ref 3.5–5.1)
POTASSIUM: 4.1 mmol/L (ref 3.5–5.1)
POTASSIUM: 5.4 mmol/L — AB (ref 3.5–5.1)
POTASSIUM: 4.2 mmol/L (ref 3.5–5.1)
SODIUM: 141 mmol/L (ref 135–145)
SODIUM: 139 mmol/L (ref 135–145)
Sodium: 142 mmol/L (ref 135–145)
Sodium: 140 mmol/L (ref 135–145)
Sodium: 142 mmol/L (ref 135–145)
TCO2: 24 mmol/L (ref 0–100)
TCO2: 23 mmol/L (ref 0–100)
TCO2: 21 mmol/L (ref 0–100)
TCO2: 27 mmol/L (ref 0–100)
TCO2: 19 mmol/L (ref 0–100)

## 2015-10-28 LAB — POCT I-STAT 3, ART BLOOD GAS (G3+)
ACID-BASE DEFICIT: 1 mmol/L (ref 0.0–2.0)
ACID-BASE DEFICIT: 5 mmol/L — AB (ref 0.0–2.0)
ACID-BASE EXCESS: 4 mmol/L — AB (ref 0.0–2.0)
Acid-base deficit: 3 mmol/L — ABNORMAL HIGH (ref 0.0–2.0)
Acid-base deficit: 7 mmol/L — ABNORMAL HIGH (ref 0.0–2.0)
Acid-base deficit: 8 mmol/L — ABNORMAL HIGH (ref 0.0–2.0)
Acid-base deficit: 9 mmol/L — ABNORMAL HIGH (ref 0.0–2.0)
BICARBONATE: 22.6 mmol/L (ref 20.0–28.0)
Bicarbonate: 21.7 mmol/L (ref 20.0–28.0)
Bicarbonate: 17.2 mmol/L — ABNORMAL LOW (ref 20.0–28.0)
Bicarbonate: 27.6 mmol/L (ref 20.0–28.0)
Bicarbonate: 19.7 mmol/L — ABNORMAL LOW (ref 20.0–28.0)
Bicarbonate: 17 mmol/L — ABNORMAL LOW (ref 20.0–28.0)
Bicarbonate: 16.4 mmol/L — ABNORMAL LOW (ref 20.0–28.0)
O2 SAT: 100 %
O2 SAT: 100 %
O2 SAT: 99 %
O2 Saturation: 100 %
O2 Saturation: 100 %
O2 Saturation: 99 %
O2 Saturation: 94 %
PCO2 ART: 35.8 mmHg (ref 32.0–48.0)
PCO2 ART: 27 mmHg — AB (ref 32.0–48.0)
PCO2 ART: 32.4 mmHg (ref 32.0–48.0)
PCO2 ART: 33.8 mmHg (ref 32.0–48.0)
PCO2 ART: 34.7 mmHg (ref 32.0–48.0)
PH ART: 7.391 (ref 7.350–7.450)
PH ART: 7.413 (ref 7.350–7.450)
PH ART: 7.389 (ref 7.350–7.450)
PH ART: 7.308 — AB (ref 7.350–7.450)
PH ART: 7.284 — AB (ref 7.350–7.450)
PO2 ART: 426 mmHg — AB (ref 83.0–108.0)
PO2 ART: 433 mmHg — AB (ref 83.0–108.0)
PO2 ART: 315 mmHg — AB (ref 83.0–108.0)
PO2 ART: 133 mmHg — AB (ref 83.0–108.0)
PO2 ART: 80 mmHg — AB (ref 83.0–108.0)
Patient temperature: 36.5
Patient temperature: 37
Patient temperature: 37
TCO2: 23 mmol/L (ref 0–100)
TCO2: 18 mmol/L (ref 0–100)
TCO2: 29 mmol/L (ref 0–100)
TCO2: 24 mmol/L (ref 0–100)
TCO2: 21 mmol/L (ref 0–100)
TCO2: 18 mmol/L (ref 0–100)
TCO2: 17 mmol/L (ref 0–100)
pCO2 arterial: 36.5 mmHg (ref 32.0–48.0)
pCO2 arterial: 34.3 mmHg (ref 32.0–48.0)
pH, Arterial: 7.486 — ABNORMAL HIGH (ref 7.350–7.450)
pH, Arterial: 7.427 (ref 7.350–7.450)
pO2, Arterial: 393 mmHg — ABNORMAL HIGH (ref 83.0–108.0)
pO2, Arterial: 144 mmHg — ABNORMAL HIGH (ref 83.0–108.0)

## 2015-10-28 LAB — CBC
HEMATOCRIT: 36.3 % — AB (ref 39.0–52.0)
HEMATOCRIT: 38.5 % — AB (ref 39.0–52.0)
HEMOGLOBIN: 11.4 g/dL — AB (ref 13.0–17.0)
HEMOGLOBIN: 12.2 g/dL — AB (ref 13.0–17.0)
MCH: 27.2 pg (ref 26.0–34.0)
MCH: 27.4 pg (ref 26.0–34.0)
MCHC: 31.4 g/dL (ref 30.0–36.0)
MCHC: 31.7 g/dL (ref 30.0–36.0)
MCV: 86.6 fL (ref 78.0–100.0)
MCV: 86.5 fL (ref 78.0–100.0)
Platelets: 139 10*3/uL — ABNORMAL LOW (ref 150–400)
Platelets: 180 10*3/uL (ref 150–400)
RBC: 4.19 MIL/uL — AB (ref 4.22–5.81)
RBC: 4.45 MIL/uL (ref 4.22–5.81)
RDW: 16.6 % — ABNORMAL HIGH (ref 11.5–15.5)
RDW: 16.9 % — AB (ref 11.5–15.5)
WBC: 13 10*3/uL — ABNORMAL HIGH (ref 4.0–10.5)
WBC: 18.9 10*3/uL — AB (ref 4.0–10.5)

## 2015-10-28 LAB — POCT I-STAT 4, (NA,K, GLUC, HGB,HCT)
GLUCOSE: 122 mg/dL — AB (ref 65–99)
HCT: 34 % — ABNORMAL LOW (ref 39.0–52.0)
Hemoglobin: 11.6 g/dL — ABNORMAL LOW (ref 13.0–17.0)
POTASSIUM: 4.3 mmol/L (ref 3.5–5.1)
SODIUM: 142 mmol/L (ref 135–145)

## 2015-10-28 LAB — GLUCOSE, CAPILLARY
GLUCOSE-CAPILLARY: 144 mg/dL — AB (ref 65–99)
GLUCOSE-CAPILLARY: 133 mg/dL — AB (ref 65–99)
GLUCOSE-CAPILLARY: 133 mg/dL — AB (ref 65–99)
GLUCOSE-CAPILLARY: 133 mg/dL — AB (ref 65–99)
GLUCOSE-CAPILLARY: 211 mg/dL — AB (ref 65–99)
Glucose-Capillary: 127 mg/dL — ABNORMAL HIGH (ref 65–99)
Glucose-Capillary: 124 mg/dL — ABNORMAL HIGH (ref 65–99)
Glucose-Capillary: 118 mg/dL — ABNORMAL HIGH (ref 65–99)
Glucose-Capillary: 128 mg/dL — ABNORMAL HIGH (ref 65–99)
Glucose-Capillary: 155 mg/dL — ABNORMAL HIGH (ref 65–99)

## 2015-10-28 LAB — MAGNESIUM: Magnesium: 2.9 mg/dL — ABNORMAL HIGH (ref 1.7–2.4)

## 2015-10-28 LAB — CREATININE, SERUM
Creatinine, Ser: 1.43 mg/dL — ABNORMAL HIGH (ref 0.61–1.24)
GFR, EST AFRICAN AMERICAN: 60 mL/min — AB (ref >60)
GFR, EST NON AFRICAN AMERICAN: 51 mL/min — AB (ref >60)

## 2015-10-28 LAB — HEMOGLOBIN A1C
HEMOGLOBIN A1C: 11.8 % — AB (ref 4.8–5.6)
MEAN PLASMA GLUCOSE: 292 mg/dL

## 2015-10-28 LAB — HEMOGLOBIN AND HEMATOCRIT, BLOOD
HCT: 26 % — ABNORMAL LOW (ref 39.0–52.0)
HEMOGLOBIN: 8.1 g/dL — AB (ref 13.0–17.0)

## 2015-10-28 LAB — APTT: aPTT: 40 seconds — ABNORMAL HIGH (ref 24–36)

## 2015-10-28 LAB — PROTIME-INR
INR: 1.56
PROTHROMBIN TIME: 18.8 s — AB (ref 11.4–15.2)

## 2015-10-28 LAB — PLATELET COUNT: PLATELETS: 142 10*3/uL — AB (ref 150–400)

## 2015-10-28 LAB — PREPARE RBC (CROSSMATCH)

## 2015-10-28 SURGERY — CORONARY ARTERY BYPASS GRAFTING (CABG)
Anesthesia: General | Site: Chest

## 2015-10-28 MED ORDER — CHLORHEXIDINE GLUCONATE 0.12 % MT SOLN
15.0000 mL | OROMUCOSAL | Status: AC
  Administered 2015-10-28: 15 mL via OROMUCOSAL
  Filled 2015-10-28: qty 15

## 2015-10-28 MED ORDER — MILRINONE LACTATE IN DEXTROSE 20-5 MG/100ML-% IV SOLN
0.2500 ug/kg/min | INTRAVENOUS | Status: DC
  Administered 2015-10-29: 0.25 ug/kg/min via INTRAVENOUS
  Filled 2015-10-28: qty 100

## 2015-10-28 MED ORDER — ROCURONIUM BROMIDE 10 MG/ML (PF) SYRINGE
PREFILLED_SYRINGE | INTRAVENOUS | Status: DC | PRN
  Administered 2015-10-28: 100 mg via INTRAVENOUS
  Administered 2015-10-28: 50 mg via INTRAVENOUS

## 2015-10-28 MED ORDER — SODIUM CHLORIDE 0.9 % IV SOLN
INTRAVENOUS | Status: DC | PRN
  Administered 2015-10-28 (×2): via INTRAVENOUS

## 2015-10-28 MED ORDER — MIDAZOLAM HCL 2 MG/2ML IJ SOLN: 2.0000 mg | INTRAMUSCULAR | Status: DC | PRN

## 2015-10-28 MED ORDER — SODIUM CHLORIDE 0.9 % IV SOLN
INTRAVENOUS | Status: DC | PRN
  Administered 2015-10-28: 07:00:00 via INTRAVENOUS

## 2015-10-28 MED ORDER — DOCUSATE SODIUM 100 MG PO CAPS
200.0000 mg | ORAL_CAPSULE | Freq: Every day | ORAL | Status: DC
  Administered 2015-10-29 – 2015-10-31 (×3): 200 mg via ORAL
  Filled 2015-10-28 (×3): qty 2

## 2015-10-28 MED ORDER — SODIUM BICARBONATE 8.4 % IV SOLN
50.0000 meq | Freq: Once | INTRAVENOUS | Status: AC
  Administered 2015-10-28: 50 meq via INTRAVENOUS

## 2015-10-28 MED ORDER — ORAL CARE MOUTH RINSE
15.0000 mL | Freq: Four times a day (QID) | OROMUCOSAL | Status: DC
  Administered 2015-10-28 – 2015-11-02 (×9): 15 mL via OROMUCOSAL

## 2015-10-28 MED ORDER — SODIUM CHLORIDE 0.9 % IV SOLN
INTRAVENOUS | Status: DC
  Administered 2015-10-28 (×2): via INTRAVENOUS

## 2015-10-28 MED ORDER — SODIUM CHLORIDE 0.9% FLUSH: 3.0000 mL | INTRAVENOUS | Status: DC | PRN

## 2015-10-28 MED ORDER — PROPOFOL 10 MG/ML IV BOLUS
INTRAVENOUS | Status: DC | PRN
  Administered 2015-10-28: 100 mg via INTRAVENOUS

## 2015-10-28 MED ORDER — DEXMEDETOMIDINE HCL IN NACL 200 MCG/50ML IV SOLN
0.0000 ug/kg/h | INTRAVENOUS | Status: DC
  Filled 2015-10-28: qty 50

## 2015-10-28 MED ORDER — MILRINONE LACTATE IN DEXTROSE 20-5 MG/100ML-% IV SOLN
0.1250 ug/kg/min | INTRAVENOUS | Status: DC
  Administered 2015-10-28: 0.25 ug/kg/min via INTRAVENOUS
  Filled 2015-10-28: qty 100

## 2015-10-28 MED ORDER — NITROGLYCERIN IN D5W 200-5 MCG/ML-% IV SOLN: 0.0000 ug/min | INTRAVENOUS | Status: DC

## 2015-10-28 MED ORDER — MILRINONE LACTATE IN DEXTROSE 20-5 MG/100ML-% IV SOLN
INTRAVENOUS | Status: DC | PRN
  Administered 2015-10-28: .25 ug/kg/min via INTRAVENOUS

## 2015-10-28 MED ORDER — ACETAMINOPHEN 500 MG PO TABS
1000.0000 mg | ORAL_TABLET | Freq: Four times a day (QID) | ORAL | Status: DC
  Administered 2015-10-28 – 2015-10-31 (×8): 1000 mg via ORAL
  Filled 2015-10-28 (×9): qty 2

## 2015-10-28 MED ORDER — MAGNESIUM SULFATE 4 GM/100ML IV SOLN
4.0000 g | Freq: Once | INTRAVENOUS | Status: AC
  Administered 2015-10-28: 4 g via INTRAVENOUS
  Filled 2015-10-28: qty 100

## 2015-10-28 MED ORDER — FAMOTIDINE IN NACL 20-0.9 MG/50ML-% IV SOLN
20.0000 mg | Freq: Two times a day (BID) | INTRAVENOUS | Status: AC
  Administered 2015-10-28: 20 mg via INTRAVENOUS

## 2015-10-28 MED ORDER — HEMOSTATIC AGENTS (NO CHARGE) OPTIME
TOPICAL | Status: DC | PRN
  Administered 2015-10-28: 1 via TOPICAL

## 2015-10-28 MED ORDER — METOCLOPRAMIDE HCL 5 MG/ML IJ SOLN
10.0000 mg | Freq: Four times a day (QID) | INTRAMUSCULAR | Status: AC
  Administered 2015-10-28 – 2015-10-29 (×4): 10 mg via INTRAVENOUS
  Filled 2015-10-28 (×3): qty 2

## 2015-10-28 MED ORDER — ACETAMINOPHEN 160 MG/5ML PO SOLN: 1000.0000 mg | Freq: Four times a day (QID) | ORAL | Status: DC

## 2015-10-28 MED ORDER — SODIUM CHLORIDE 0.9% FLUSH
3.0000 mL | Freq: Two times a day (BID) | INTRAVENOUS | Status: DC
  Administered 2015-10-29 – 2015-10-31 (×5): 3 mL via INTRAVENOUS

## 2015-10-28 MED ORDER — HEPARIN SODIUM (PORCINE) 1000 UNIT/ML IJ SOLN
INTRAMUSCULAR | Status: DC | PRN
  Administered 2015-10-28: 37000 [IU] via INTRAVENOUS

## 2015-10-28 MED ORDER — MORPHINE SULFATE (PF) 2 MG/ML IV SOLN: 1.0000 mg | INTRAVENOUS | Status: AC | PRN

## 2015-10-28 MED ORDER — SODIUM CHLORIDE 0.9 % IV SOLN: 250.0000 mL | INTRAVENOUS | Status: DC

## 2015-10-28 MED ORDER — SODIUM CHLORIDE 0.9 % IV SOLN
INTRAVENOUS | Status: DC
  Administered 2015-10-29: 4.8 [IU]/h via INTRAVENOUS
  Filled 2015-10-28 (×2): qty 2.5

## 2015-10-28 MED ORDER — METOPROLOL TARTRATE 5 MG/5ML IV SOLN: 2.5000 mg | INTRAVENOUS | Status: DC | PRN

## 2015-10-28 MED ORDER — DEXTROSE 5 % IV SOLN
1.5000 g | Freq: Two times a day (BID) | INTRAVENOUS | Status: AC
  Administered 2015-10-28 – 2015-10-30 (×4): 1.5 g via INTRAVENOUS
  Filled 2015-10-28 (×4): qty 1.5

## 2015-10-28 MED ORDER — MIDAZOLAM HCL 5 MG/5ML IJ SOLN
INTRAMUSCULAR | Status: DC | PRN
  Administered 2015-10-28 (×2): 2 mg via INTRAVENOUS

## 2015-10-28 MED ORDER — THROMBIN 20000 UNITS EX SOLR
CUTANEOUS | Status: DC | PRN
  Administered 2015-10-28: 20000 [IU] via TOPICAL

## 2015-10-28 MED ORDER — ACETAMINOPHEN 160 MG/5ML PO SOLN
650.0000 mg | Freq: Once | ORAL | Status: AC
  Administered 2015-10-28: 650 mg
  Filled 2015-10-28: qty 20.3

## 2015-10-28 MED ORDER — VANCOMYCIN HCL IN DEXTROSE 1-5 GM/200ML-% IV SOLN
1000.0000 mg | Freq: Once | INTRAVENOUS | Status: AC
  Administered 2015-10-28: 1000 mg via INTRAVENOUS
  Filled 2015-10-28: qty 200

## 2015-10-28 MED ORDER — BISACODYL 10 MG RE SUPP: 10.0000 mg | Freq: Every day | RECTAL | Status: DC

## 2015-10-28 MED ORDER — ONDANSETRON HCL 4 MG/2ML IJ SOLN: 4.0000 mg | Freq: Four times a day (QID) | INTRAMUSCULAR | Status: DC | PRN

## 2015-10-28 MED ORDER — FENTANYL CITRATE (PF) 250 MCG/5ML IJ SOLN
INTRAMUSCULAR | Status: AC
  Filled 2015-10-28: qty 25

## 2015-10-28 MED ORDER — MIDAZOLAM HCL 10 MG/2ML IJ SOLN
INTRAMUSCULAR | Status: AC
  Filled 2015-10-28: qty 2

## 2015-10-28 MED ORDER — METOPROLOL TARTRATE 25 MG/10 ML ORAL SUSPENSION: 12.5000 mg | Freq: Two times a day (BID) | ORAL | Status: DC

## 2015-10-28 MED ORDER — FENTANYL CITRATE (PF) 250 MCG/5ML IJ SOLN
INTRAMUSCULAR | Status: DC | PRN
  Administered 2015-10-28: 250 ug via INTRAVENOUS
  Administered 2015-10-28: 450 ug via INTRAVENOUS
  Administered 2015-10-28: 50 ug via INTRAVENOUS
  Administered 2015-10-28: 150 ug via INTRAVENOUS
  Administered 2015-10-28: 100 ug via INTRAVENOUS

## 2015-10-28 MED ORDER — SODIUM CHLORIDE 0.45 % IV SOLN
INTRAVENOUS | Status: DC | PRN
  Administered 2015-10-28: 13:00:00 via INTRAVENOUS

## 2015-10-28 MED ORDER — LACTATED RINGERS IV SOLN: INTRAVENOUS | Status: DC

## 2015-10-28 MED ORDER — PROPOFOL 10 MG/ML IV BOLUS
INTRAVENOUS | Status: AC
  Filled 2015-10-28: qty 20

## 2015-10-28 MED ORDER — OXYCODONE HCL 5 MG PO TABS
5.0000 mg | ORAL_TABLET | ORAL | Status: DC | PRN
  Administered 2015-10-29: 10 mg via ORAL
  Administered 2015-10-29 (×2): 5 mg via ORAL
  Filled 2015-10-28 (×2): qty 1
  Filled 2015-10-28: qty 2

## 2015-10-28 MED ORDER — PROTAMINE SULFATE 10 MG/ML IV SOLN
INTRAVENOUS | Status: DC | PRN
  Administered 2015-10-28: 330 mg via INTRAVENOUS
  Administered 2015-10-28: 20 mg via INTRAVENOUS

## 2015-10-28 MED ORDER — ASPIRIN 81 MG PO CHEW: 324.0000 mg | CHEWABLE_TABLET | Freq: Every day | ORAL | Status: DC

## 2015-10-28 MED ORDER — ALBUMIN HUMAN 5 % IV SOLN
250.0000 mL | INTRAVENOUS | Status: AC | PRN
  Filled 2015-10-28: qty 250

## 2015-10-28 MED ORDER — INSULIN REGULAR BOLUS VIA INFUSION
0.0000 [IU] | Freq: Three times a day (TID) | INTRAVENOUS | Status: DC
  Filled 2015-10-28: qty 10

## 2015-10-28 MED ORDER — BISACODYL 5 MG PO TBEC
10.0000 mg | DELAYED_RELEASE_TABLET | Freq: Every day | ORAL | Status: DC
  Administered 2015-10-29 – 2015-10-31 (×3): 10 mg via ORAL
  Filled 2015-10-28 (×3): qty 2

## 2015-10-28 MED ORDER — PHENYLEPHRINE HCL 10 MG/ML IJ SOLN
INTRAVENOUS | Status: DC | PRN
  Administered 2015-10-28: 20 ug/min via INTRAVENOUS

## 2015-10-28 MED ORDER — ASPIRIN EC 325 MG PO TBEC
325.0000 mg | DELAYED_RELEASE_TABLET | Freq: Every day | ORAL | Status: DC
  Administered 2015-10-29 – 2015-10-31 (×3): 325 mg via ORAL
  Filled 2015-10-28 (×3): qty 1

## 2015-10-28 MED ORDER — PHENYLEPHRINE HCL 10 MG/ML IJ SOLN
0.0000 ug/min | INTRAVENOUS | Status: DC
  Administered 2015-10-28: 40 ug/min via INTRAVENOUS
  Filled 2015-10-28 (×3): qty 2

## 2015-10-28 MED ORDER — METOPROLOL TARTRATE 12.5 MG HALF TABLET: 12.5000 mg | ORAL_TABLET | Freq: Two times a day (BID) | ORAL | Status: DC

## 2015-10-28 MED ORDER — LACTATED RINGERS IV SOLN: 500.0000 mL | Freq: Once | INTRAVENOUS | Status: DC | PRN

## 2015-10-28 MED ORDER — CHLORHEXIDINE GLUCONATE 0.12% ORAL RINSE (MEDLINE KIT)
15.0000 mL | Freq: Two times a day (BID) | OROMUCOSAL | Status: DC
  Administered 2015-10-28 – 2015-11-01 (×4): 15 mL via OROMUCOSAL

## 2015-10-28 MED ORDER — THROMBIN 20000 UNITS EX SOLR
CUTANEOUS | Status: DC | PRN
  Administered 2015-10-28: 4 mL via TOPICAL
  Administered 2015-10-28: 8 mL via TOPICAL

## 2015-10-28 MED ORDER — DOPAMINE-DEXTROSE 3.2-5 MG/ML-% IV SOLN
3.0000 ug/kg/min | INTRAVENOUS | Status: DC
  Administered 2015-10-28: 3 ug/kg/min via INTRAVENOUS

## 2015-10-28 MED ORDER — LACTATED RINGERS IV SOLN
INTRAVENOUS | Status: DC
Start: 2015-10-28 — End: 2015-10-31

## 2015-10-28 MED ORDER — THROMBIN 20000 UNITS EX SOLR
CUTANEOUS | Status: AC
  Filled 2015-10-28: qty 20000

## 2015-10-28 MED ORDER — ACETAMINOPHEN 650 MG RE SUPP: 650.0000 mg | Freq: Once | RECTAL | Status: AC

## 2015-10-28 MED ORDER — PANTOPRAZOLE SODIUM 40 MG PO TBEC
40.0000 mg | DELAYED_RELEASE_TABLET | Freq: Every day | ORAL | Status: DC
  Administered 2015-10-30: 40 mg via ORAL
  Filled 2015-10-28: qty 1

## 2015-10-28 MED ORDER — TRAMADOL HCL 50 MG PO TABS: 50.0000 mg | ORAL_TABLET | ORAL | Status: DC | PRN

## 2015-10-28 MED ORDER — POTASSIUM CHLORIDE 10 MEQ/50ML IV SOLN: 10.0000 meq | INTRAVENOUS | Status: AC

## 2015-10-28 MED ORDER — MORPHINE SULFATE (PF) 2 MG/ML IV SOLN
2.0000 mg | INTRAVENOUS | Status: DC | PRN
  Administered 2015-10-28 – 2015-10-29 (×7): 2 mg via INTRAVENOUS
  Filled 2015-10-28: qty 2
  Filled 2015-10-28 (×5): qty 1

## 2015-10-28 MED FILL — Magnesium Sulfate Inj 50%: INTRAMUSCULAR | Qty: 10 | Status: AC

## 2015-10-28 SURGICAL SUPPLY — 106 items
ADH SKN CLS APL DERMABOND .7 (GAUZE/BANDAGES/DRESSINGS) ×2
BAG DECANTER FOR FLEXI CONT (MISCELLANEOUS) ×4 IMPLANT
BANDAGE ACE 4X5 VEL STRL LF (GAUZE/BANDAGES/DRESSINGS) ×4 IMPLANT
BANDAGE ACE 6X5 VEL STRL LF (GAUZE/BANDAGES/DRESSINGS) ×4 IMPLANT
BASKET HEART  (ORDER IN 25'S) (MISCELLANEOUS) ×1
BASKET HEART (ORDER IN 25'S) (MISCELLANEOUS) ×1
BASKET HEART (ORDER IN 25S) (MISCELLANEOUS) ×2 IMPLANT
BLADE 11 SAFETY STRL DISP (BLADE) ×4 IMPLANT
BLADE CLIPPER SURG (BLADE) ×2 IMPLANT
BLADE STERNUM SYSTEM 6 (BLADE) ×4 IMPLANT
BNDG GAUZE ELAST 4 BULKY (GAUZE/BANDAGES/DRESSINGS) ×4 IMPLANT
CANISTER SUCTION 2500CC (MISCELLANEOUS) ×4 IMPLANT
CATH ROBINSON RED A/P 18FR (CATHETERS) ×8 IMPLANT
CATH THORACIC 28FR (CATHETERS) ×4 IMPLANT
CATH THORACIC 36FR (CATHETERS) ×4 IMPLANT
CATH THORACIC 36FR RT ANG (CATHETERS) ×4 IMPLANT
CLIP TI MEDIUM 24 (CLIP) IMPLANT
CLIP TI WIDE RED SMALL 24 (CLIP) ×2 IMPLANT
CRADLE DONUT ADULT HEAD (MISCELLANEOUS) ×4 IMPLANT
DERMABOND ADVANCED (GAUZE/BANDAGES/DRESSINGS) ×2
DERMABOND ADVANCED .7 DNX12 (GAUZE/BANDAGES/DRESSINGS) IMPLANT
DRAPE CARDIOVASCULAR INCISE (DRAPES) ×4
DRAPE SLUSH/WARMER DISC (DRAPES) ×4 IMPLANT
DRAPE SRG 135X102X78XABS (DRAPES) ×2 IMPLANT
DRSG COVADERM 4X14 (GAUZE/BANDAGES/DRESSINGS) ×4 IMPLANT
ELECT CAUTERY BLADE 6.4 (BLADE) ×4 IMPLANT
ELECT REM PT RETURN 9FT ADLT (ELECTROSURGICAL) ×8
ELECTRODE REM PT RTRN 9FT ADLT (ELECTROSURGICAL) ×4 IMPLANT
FELT TEFLON 1X6 (MISCELLANEOUS) ×8 IMPLANT
GAUZE SPONGE 4X4 12PLY STRL (GAUZE/BANDAGES/DRESSINGS) ×8 IMPLANT
GLOVE BIO SURGEON STRL SZ 6 (GLOVE) ×4 IMPLANT
GLOVE BIO SURGEON STRL SZ 6.5 (GLOVE) ×6 IMPLANT
GLOVE BIO SURGEON STRL SZ7 (GLOVE) ×8 IMPLANT
GLOVE BIO SURGEON STRL SZ7.5 (GLOVE) ×4 IMPLANT
GLOVE BIO SURGEONS STRL SZ 6.5 (GLOVE) ×6
GLOVE BIOGEL PI IND STRL 6 (GLOVE) IMPLANT
GLOVE BIOGEL PI IND STRL 6.5 (GLOVE) IMPLANT
GLOVE BIOGEL PI IND STRL 7.0 (GLOVE) IMPLANT
GLOVE BIOGEL PI INDICATOR 6 (GLOVE) ×4
GLOVE BIOGEL PI INDICATOR 6.5 (GLOVE) ×6
GLOVE BIOGEL PI INDICATOR 7.0 (GLOVE) ×4
GLOVE EUDERMIC 7 POWDERFREE (GLOVE) ×8 IMPLANT
GLOVE ORTHO TXT STRL SZ7.5 (GLOVE) IMPLANT
GOWN STRL REUS W/ TWL LRG LVL3 (GOWN DISPOSABLE) ×8 IMPLANT
GOWN STRL REUS W/ TWL XL LVL3 (GOWN DISPOSABLE) ×2 IMPLANT
GOWN STRL REUS W/TWL LRG LVL3 (GOWN DISPOSABLE) ×16
GOWN STRL REUS W/TWL XL LVL3 (GOWN DISPOSABLE) ×4
HEMOSTAT POWDER SURGIFOAM 1G (HEMOSTASIS) ×12 IMPLANT
HEMOSTAT SURGICEL 2X14 (HEMOSTASIS) ×4 IMPLANT
INSERT FOGARTY 61MM (MISCELLANEOUS) IMPLANT
INSERT FOGARTY XLG (MISCELLANEOUS) IMPLANT
KIT BASIN OR (CUSTOM PROCEDURE TRAY) ×4 IMPLANT
KIT CATH CPB BARTLE (MISCELLANEOUS) ×4 IMPLANT
KIT ROOM TURNOVER OR (KITS) ×4 IMPLANT
KIT SUCTION CATH 14FR (SUCTIONS) ×6 IMPLANT
KIT VASOVIEW HEMOPRO VH 3000 (KITS) ×4 IMPLANT
NS IRRIG 1000ML POUR BTL (IV SOLUTION) ×20 IMPLANT
PACK OPEN HEART (CUSTOM PROCEDURE TRAY) ×4 IMPLANT
PAD ARMBOARD 7.5X6 YLW CONV (MISCELLANEOUS) ×8 IMPLANT
PAD ELECT DEFIB RADIOL ZOLL (MISCELLANEOUS) ×4 IMPLANT
PENCIL BUTTON HOLSTER BLD 10FT (ELECTRODE) ×4 IMPLANT
PUNCH AORTIC ROTATE 4.0MM (MISCELLANEOUS) IMPLANT
PUNCH AORTIC ROTATE 4.5MM 8IN (MISCELLANEOUS) ×4 IMPLANT
PUNCH AORTIC ROTATE 5MM 8IN (MISCELLANEOUS) IMPLANT
SET CARDIOPLEGIA MPS 5001102 (MISCELLANEOUS) ×2 IMPLANT
SOLUTION ANTI FOG 6CC (MISCELLANEOUS) ×2 IMPLANT
SPONGE GAUZE 4X4 12PLY STER LF (GAUZE/BANDAGES/DRESSINGS) ×4 IMPLANT
SPONGE INTESTINAL PEANUT (DISPOSABLE) IMPLANT
SPONGE LAP 18X18 X RAY DECT (DISPOSABLE) IMPLANT
SPONGE LAP 4X18 X RAY DECT (DISPOSABLE) ×2 IMPLANT
SUT BONE WAX W31G (SUTURE) ×4 IMPLANT
SUT MNCRL AB 4-0 PS2 18 (SUTURE) ×2 IMPLANT
SUT PROLENE 3 0 SH DA (SUTURE) IMPLANT
SUT PROLENE 3 0 SH1 36 (SUTURE) ×4 IMPLANT
SUT PROLENE 4 0 RB 1 (SUTURE)
SUT PROLENE 4 0 SH DA (SUTURE) IMPLANT
SUT PROLENE 4-0 RB1 .5 CRCL 36 (SUTURE) IMPLANT
SUT PROLENE 5 0 C 1 36 (SUTURE) IMPLANT
SUT PROLENE 6 0 C 1 30 (SUTURE) IMPLANT
SUT PROLENE 7 0 BV 1 (SUTURE) IMPLANT
SUT PROLENE 7 0 BV1 MDA (SUTURE) ×4 IMPLANT
SUT PROLENE 8 0 BV175 6 (SUTURE) ×2 IMPLANT
SUT SILK  1 MH (SUTURE)
SUT SILK 1 MH (SUTURE) IMPLANT
SUT STEEL 6MS V (SUTURE) ×4 IMPLANT
SUT STEEL STERNAL CCS#1 18IN (SUTURE) IMPLANT
SUT STEEL SZ 6 DBL 3X14 BALL (SUTURE) IMPLANT
SUT VIC AB 1 CTX 36 (SUTURE) ×12
SUT VIC AB 1 CTX36XBRD ANBCTR (SUTURE) ×4 IMPLANT
SUT VIC AB 2-0 CT1 27 (SUTURE) ×4
SUT VIC AB 2-0 CT1 TAPERPNT 27 (SUTURE) IMPLANT
SUT VIC AB 2-0 CTX 27 (SUTURE) IMPLANT
SUT VIC AB 3-0 SH 27 (SUTURE)
SUT VIC AB 3-0 SH 27X BRD (SUTURE) IMPLANT
SUT VIC AB 3-0 X1 27 (SUTURE) IMPLANT
SUT VICRYL 4-0 PS2 18IN ABS (SUTURE) IMPLANT
SUTURE E-PAK OPEN HEART (SUTURE) ×4 IMPLANT
SYSTEM SAHARA CHEST DRAIN ATS (WOUND CARE) ×4 IMPLANT
TAPE CLOTH SURG 4X10 WHT LF (GAUZE/BANDAGES/DRESSINGS) ×2 IMPLANT
TAPE PAPER 2X10 WHT MICROPORE (GAUZE/BANDAGES/DRESSINGS) ×4 IMPLANT
TOWEL OR 17X24 6PK STRL BLUE (TOWEL DISPOSABLE) ×4 IMPLANT
TOWEL OR 17X26 10 PK STRL BLUE (TOWEL DISPOSABLE) ×4 IMPLANT
TRAY FOLEY IC TEMP SENS 16FR (CATHETERS) ×4 IMPLANT
TUBING INSUFFLATION (TUBING) ×4 IMPLANT
UNDERPAD 30X30 (UNDERPADS AND DIAPERS) ×4 IMPLANT
WATER STERILE IRR 1000ML POUR (IV SOLUTION) ×8 IMPLANT

## 2015-10-28 NOTE — Progress Notes (Signed)
Subjective: Interval History: post op CABG.  On pressors and antiarrhythmics Objective: Vital signs in last 24 hours: Temp:  [97.5 F (36.4 C)-97.9 F (36.6 C)] 97.5 F (36.4 C) (09/25 1330) Pulse Rate:  [83-102] 99 (09/25 1330) Resp:  [12-20] 12 (09/25 1330) BP: (96-158)/(57-74) 113/70 (09/25 1300) SpO2:  [96 %-100 %] 100 % (09/25 1330) Arterial Line BP: (104-111)/(51-56) 104/51 (09/25 1330) FiO2 (%):  [50 %] 50 % (09/25 1232) Weight:  [63.3 kg (139 lb 8.8 oz)] 63.3 kg (139 lb 8.8 oz) (09/25 0532) Weight change:   Intake/Output from previous day: No intake/output data recorded. Intake/Output this shift: Total I/O In: 1431 [I.V.:1054; Blood:377] Out: 1435 [Urine:655; Emesis/NG output:20; Blood:700; Chest Tube:60]  General appearance: sedated on vent, not responsive Resp: diminished breath sounds bilaterally and rhonchi bilaterally Cardio: S1, S2 normal and systolic murmur: holosystolic 2/6, blowing at apex GI: no bs, TX RLQ.  Extremities: edema 1+  Lab Results:  Recent Labs  10/27/15 0321  10/28/15 1029  10/28/15 1230 10/28/15 1232  WBC 6.7  --   --   --   --  13.0*  HGB 12.3*  < > 8.1*  < > 11.6* 11.4*  HCT 39.4  < > 26.0*  < > 34.0* 36.3*  PLT 226  --  142*  --   --  139*  < > = values in this interval not displayed. BMET:  Recent Labs  10/26/15 0601 10/27/15 0321  10/28/15 0946 10/28/15 1039 10/28/15 1230  NA 138 138  < > 139 140 142  K 4.7 4.7  < > 4.1 5.4* 4.3  CL 112* 110  < > 106 107  --   CO2 20* 20*  --   --   --   --   GLUCOSE 305* 129*  < > 79 78 122*  BUN 20 19  < > 14 19  --   CREATININE 1.29* 1.27*  < > 0.60* 1.00  --   CALCIUM 8.7* 8.8*  --   --   --   --   < > = values in this interval not displayed. No results for input(s): PTH in the last 72 hours. Iron Studies: No results for input(s): IRON, TIBC, TRANSFERRIN, FERRITIN in the last 72 hours.  Studies/Results: Dg Chest 2 View  Result Date: 10/27/2015 CLINICAL DATA:  60 year old male  with a history of preoperative chest x-ray exam EXAM: CHEST  2 VIEW COMPARISON:  10/18/2015, 04/01/2015 FINDINGS: Cardiomediastinal silhouette borderline enlarged. Stents within coronary vasculature with associated calcifications. Calcifications of the aortic arch. Diffuse interstitial opacities of the bilateral lungs which are most pronounced in the mid and lower lungs. No pleural effusion. No confluent airspace disease. Similar appearance of vertebral bodies with lower thoracic compression fracture unchanged. No acute fracture line identified. IMPRESSION: Interstitial opacities of the bilateral lungs in the mid and lower lungs may reflect chronic interstitial fibrosis, though superimposed disease such as atypical pneumonia or edema difficult to exclude. Aortic atherosclerosis with evidence of coronary artery disease. Signed, Yvone NeuJaime S. Loreta AveWagner, DO [facility] Electronically Signed   By: Gilmer MorJaime  Wagner D.O.   On: 10/27/2015 18:04   Dg Chest Port 1 View  Result Date: 10/28/2015 CLINICAL DATA:  60 year old male post CABG.  Subsequent encounter. EXAM: PORTABLE CHEST 1 VIEW COMPARISON:  10/27/2015 chest x-ray. FINDINGS: Endotracheal tube tip 1.9 cm above the carina. This will need to be monitored closely to exclude mainstem bronchus intubation with change in patient's neck position.  Right-sided Swan-Ganz catheter tip main pulmonary artery level. Left-sided chest tube and mediastinal drains in place without evidence a pneumothorax. Post CABG.  Cardiomegaly. Pulmonary vascular prominence most notable centrally. Findings are superimposed upon chronic changes noted on the preoperative exam. Left base minimal subsegmental atelectasis. IMPRESSION: Endotracheal tube tip 1.9 cm above the carina. This will need to be monitored closely to exclude mainstem bronchus intubation with change in patient's neck position. Right-sided Swan-Ganz catheter tip main pulmonary artery  level. Left-sided chest tube and mediastinal drains in place without evidence a pneumothorax. Post CABG.  Cardiomegaly. Mild pulmonary vascular prominence superimposed upon chronic changes. Left base minimal subsegmental atelectasis. Electronically Signed   By: Lacy Duverney M.D.   On: 10/28/2015 13:38    I have reviewed the patient's current medications.  Assessment/Plan: 1 Renal Tx  Follow Cr, vol ok. K ok 2 DM controlled 3 Anemia 4 CAD CABG per [facility] P follow vol, chem meds, cont immunosuppress    LOS: 9 days   Mell Mellott L 10/28/2015,2:07 PM

## 2015-10-28 NOTE — Procedures (Signed)
Extubation Procedure Note  Patient Details:   Name: Steven Riddle DOB: [birth year] MRN: [id number]   Airway Documentation: Patient extubated to 4 lpm nasal cannula.  Vital capacity 900 ml, NIF -30, patient able to lift and hold head off bed.  Positive cuff leak, patient able to speak post procedure.  Tolerated well, no complications.  Evaluation  O2 sats: stable throughout Complications: No apparent complications Patient did tolerate procedure well. Bilateral Breath Sounds: Clear   Yes  Lorilee Cafarella, Aloha GellJohn P 10/28/2015, 9:36 PM

## 2015-10-28 NOTE — Op Note (Signed)
CARDIOVASCULAR SURGERY OPERATIVE NOTE  10/28/2015  Surgeon:  Alleen BorneBryan K. Tyquan Carmickle, MD  First Assistant: Lowella DandyErin Barrett,  PA-C   Preoperative Diagnosis:  Severe multi-vessel coronary artery disease   Postoperative Diagnosis:  Same   Procedure:  1. Median Sternotomy 2. Extracorporeal circulation 3.   Coronary artery bypass grafting x 3   Left internal mammary graft to the LAD  SVG to diagonal  SVG to OM  4.   Endoscopic vein harvest from the right leg   Anesthesia:  General Endotracheal   Clinical History/Surgical Indication:  The patient is a 60 year old [place] gentleman with a history of poorly controlled DM with peripheral neuropathy and ESRD, hypertension, s/p renal transplant as well as severe multi-vessel coronary artery disease s/p PCI/stenting of the LCX  on 06/04/2014, stenting of the LAD/D on 06/07/2014, stenting of the RCA on 09/06/2014. He reports a two week history of progressive shortness of breath, cough and leg edema. He denies any chest pain or pressure. ECG on presentation showed NSR with old AS MI and RBBB. Troponin was minimally elevated. An echo on 9/17 showed an LVEF of 40-45% with diffuse hypokinesis. There was mild AS with a mean gradient of 10 mm Hg, peak of 19 mm Hg. The peak velocity ratio was 0.37 and the indexed valve area 0.54 cm2/m2. Cardiac cath today showed 70% ostial LM stenosis, 90% ostial LAD, 95% ostial and proximal LCX.  He has severe left main and multi-vessel coronary disease with relatively small and diffusely diseased diabetic vessels. His vessels are graftable and he is not a candidate for any further PCI. His Hgb A1c was 12.8 and it was 11.6 in February. His long term prognosis is poor unless he can get his risk factors under control. He has mild aortic stenosis but the valve is trileaflet and and the leaflets move well with mild calcification so I don't  think AVR is needed at this time. He is at increased risk for surgery due to his poorly controlled DM and is at risk of dysfunction of his transplanted kidney. I discussed the operative procedure with the patient including alternatives, benefits and risks; including but not limited to bleeding, blood transfusion, infection, stroke, myocardial infarction, graft failure, heart block requiring a permanent pacemaker, organ dysfunction, and death.  Elliot CousinAmanullah Botz understands and agrees to proceed.   Preparation:  The patient was seen in the preoperative holding area and the correct patient, correct operation were confirmed with the patient after reviewing the medical record and catheterization. The consent was signed by me. Preoperative antibiotics were given. A pulmonary arterial line and radial arterial line were placed by the anesthesia team. He was noted to have moderate pulmonary HTN with a PAP of 61/25The patient was taken back to the operating room and positioned supine on the operating room table. After being placed under general endotracheal anesthesia by the anesthesia team a foley catheter was placed. The neck, chest, abdomen, and both legs were prepped with betadine soap and solution and draped in the usual sterile manner. A surgical time-out was taken and the correct patient and operative procedure were confirmed with the nursing and anesthesia staff.  TEE: performed by Dr. Arrie AranStephen Turk  This showed a trileaflet aortic valve with mild aortic stenosis. The leaflets moved well. The EF was lower than on the preop echo and estimated at about 30-35%. There was trivial MR.  Cardiopulmonary Bypass:  A median sternotomy was performed. The pericardium was opened in the midline. Right ventricular function appeared  normal. The ascending aorta was of normal size and had no palpable plaque. There were no contraindications to aortic cannulation or cross-clamping. The patient was fully systemically  heparinized and the ACT was maintained > 400 sec. The proximal aortic arch was cannulated with a 20 F aortic cannula for arterial inflow. Venous cannulation was performed via the right atrial appendage using a two-staged venous cannula. An antegrade cardioplegia/vent cannula was inserted into the mid-ascending aorta. Aortic occlusion was performed with a single cross-clamp. Systemic cooling to 32 degrees Centigrade and topical cooling of the heart with iced saline were used. Hyperkalemic antegrade cold blood cardioplegia was used to induce diastolic arrest and was then given at about 20 minute intervals throughout the period of arrest to maintain myocardial temperature at or below 10 degrees centigrade. A temperature probe was inserted into the interventricular septum and an insulating pad was placed in the pericardium. The septum felt like scar.   Left internal mammary harvest:  The left side of the sternum was retracted using the Rultract retractor. The left internal mammary artery was harvested as a pedicle graft. All side branches were clipped. It was a medium-sized vessel of good quality with excellent blood flow. It was ligated distally and divided. It was sprayed with topical papaverine solution to prevent vasospasm.   Endoscopic vein harvest:  The right greater saphenous vein was harvested endoscopically through a 2 cm incision medial to the right knee. It was harvested from the upper thigh to below the knee. It was a medium-sized vein of good quality. The side branches were all ligated with 4-0 silk ties.    Coronary arteries:  The coronary arteries were examined.   LAD:  Large vessel that was heavily diseased proximally but the mid and distal vessel only had mild disease. The diagonal was a moderate sized vessel with diffuse disease but suitable for grafting.   LCX:  Small intermediate branch. The OM was a small caliber dual-branch system with mild distal diffuse disease.These branches  were long and supplied most of the lateral and some of the inferior wall.  RCA:  Stented throughout the proximal and mid-portions. The PDA is diffusely diseased.   Grafts:  1. LIMA to the LAD: 2.0 mm. It was sewn end to side using 8-0 prolene continuous suture. 2. SVG to diagonal:  1.6 mm. It was sewn end to side using 7-0 prolene continuous suture. 3. SVG to OM:  1.6 mm. It was sewn end to side using 7-0 prolene continuous suture.   The proximal vein graft anastomoses were performed to the mid-ascending aorta using continuous 6-0 prolene suture. Graft markers were placed around the proximal anastomoses.   Completion:  The patient was rewarmed to 37 degrees Centigrade. The clamp was removed from the LIMA pedicle and there was rapid warming of the septum and return of ventricular fibrillation. The crossclamp was removed with a time of 62 minutes. There was spontaneous return of sinus rhythm. The distal and proximal anastomoses were checked for hemostasis. The position of the grafts was satisfactory. Two temporary epicardial pacing wires were placed on the right atrium and two on the right ventricle. The patient was weaned from CPB without difficulty on milrinone 0.2 and dop 3. CPB time was 84 minutes. Cardiac output was 5 LPM. LV function looked improved on echo, especially the lateral wall.Heparin was fully reversed with protamine and the aortic and venous cannulas removed. Hemostasis was achieved. Mediastinal and left pleural drainage tubes were placed. The sternum was closed with  #  6 stainless steel wires. The fascia was closed with continuous # 1 vicryl suture. The subcutaneous tissue was closed with 2-0 vicryl continuous suture. The skin was closed with 3-0 vicryl subcuticular suture. All sponge, needle, and instrument counts were reported correct at the end of the case. Dry sterile dressings were placed over the incisions and around the chest tubes which were connected to pleurevac suction. The  patient was then transported to the surgical intensive care unit in critical but stable condition.

## 2015-10-28 NOTE — Brief Op Note (Signed)
10/18/2015 - 10/28/2015  10:37 AM  PATIENT:  Steven Riddle  60 y.o. male  PRE-OPERATIVE DIAGNOSIS:  CAD  POST-OPERATIVE DIAGNOSIS:  CAD  PROCEDURE:  Procedure(s):  CORONARY ARTERY BYPASS GRAFTING x 3 -LIMA to LAD -SVG to DIAGONAL -SVG to OM2  ENDOSCOPIC HARVEST GREATER SAPHENEOUS VEINS -Right Thigh  TRANSESOPHAGEAL ECHOCARDIOGRAM (TEE) (N/A)  SURGEON:  Surgeon(s) and Role:    * Alleen BorneBryan K Bartle, MD - Primary  PHYSICIAN ASSISTANT: Lowella DandyErin Naquita Nappier PA-C  ANESTHESIA:   general  EBL:  Total I/O In: -  Out: 200 [Blood:200]  BLOOD ADMINISTERED:*CELLSAVER  DRAINS: Left Pleural Chest Tube, Mediastinal Chest drains   LOCAL MEDICATIONS USED:  NONE  SPECIMEN:  No Specimen  DISPOSITION OF SPECIMEN:  N/A  COUNTS:  YES  TOURNIQUET:  * No tourniquets in log *  DICTATION: .Dragon Dictation  PLAN OF CARE: Admit to inpatient   PATIENT DISPOSITION:  ICU - intubated and hemodynamically stable.   Delay start of Pharmacological VTE agent (>24hrs) due to surgical blood loss or risk of bleeding: yes

## 2015-10-28 NOTE — Anesthesia Postprocedure Evaluation (Signed)
Anesthesia Post Note  Patient: Elliot Cousinmanullah Cloward  Procedure(s) Performed: Procedure(s) (LRB): CORONARY ARTERY BYPASS GRAFTING (CABG)  X  3   UTILIZING THE LEFT INTERNAL MAMMARY ARTERY AND ENDOSCOPICALLY HARVESTED RIGHT SAPHENEOUS VEINS. (N/A) TRANSESOPHAGEAL ECHOCARDIOGRAM (TEE) (N/A)  Patient location during evaluation: SICU Anesthesia Type: General Level of consciousness: sedated and patient remains intubated per anesthesia plan Pain management: pain level controlled Vital Signs Assessment: post-procedure vital signs reviewed and stable Respiratory status: patient remains intubated per anesthesia plan Cardiovascular status: stable Anesthetic complications: no    Last Vitals:  Vitals:   10/28/15 1545 10/28/15 1600  BP: (!) 102/42 133/77  Pulse: 88 86  Resp: 12 12  Temp: 36.2 C (!) 35.9 C    Last Pain:  Vitals:   10/28/15 0532  TempSrc: Oral  PainSc:                  Cecile HearingStephen Edward Zephyr Ridley

## 2015-10-28 NOTE — Progress Notes (Signed)
  Echocardiogram Echocardiogram Transesophageal has been performed.  Leta JunglingCooper, Wyndi Northrup M 10/28/2015, 7:53 AM

## 2015-10-28 NOTE — Progress Notes (Signed)
CT surgery p.m. Rounds  Patient doing well after multivessel CABG earlier today Currently on pressure support-CPAP ventilator wean The patient opens eyes Hemodynamics stable Minimal chest tube drainage Status post kidney transplant 2013 with adequate urine output

## 2015-10-28 NOTE — Transfer of Care (Signed)
Immediate Anesthesia Transfer of Care Note  Patient: Steven Riddle  Procedure(s) Performed: Procedure(s): CORONARY ARTERY BYPASS GRAFTING (CABG)  X  3   UTILIZING THE LEFT INTERNAL MAMMARY ARTERY AND ENDOSCOPICALLY HARVESTED RIGHT SAPHENEOUS VEINS. (N/A) TRANSESOPHAGEAL ECHOCARDIOGRAM (TEE) (N/A)  Patient Location: ICU  Anesthesia Type:General  Level of Consciousness: sedated and Patient remains intubated per anesthesia plan  Airway & Oxygen Therapy: Patient remains intubated per anesthesia plan and Patient placed on Ventilator (see vital sign flow sheet for setting)  Post-op Assessment: Report given to RN and Post -op Vital signs reviewed and stable  Post vital signs: Reviewed and stable BP 107/57 aline; HR 102 apaced; spo2 100%  Last Vitals:  Vitals:   10/27/15 1950 10/28/15 0532  BP: (!) 134/57 (!) 158/74  Pulse: 83 99  Resp: 20 20  Temp: 36.6 C 36.4 C    Last Pain:  Vitals:   10/28/15 0532  TempSrc: Oral  PainSc:          Complications: No apparent anesthesia complications

## 2015-10-28 NOTE — Anesthesia Procedure Notes (Signed)
Central Venous Catheter Insertion Performed by: anesthesiologist Patient location: Pre-op. Preanesthetic checklist: patient identified, IV checked, site marked, risks and benefits discussed, surgical consent, monitors and equipment checked, pre-op evaluation, timeout performed and anesthesia consent Position: Trendelenburg Lidocaine 1% used for infiltration Landmarks identified and Seldinger technique used Catheter size: 9 Fr Central line and PA cath was placed.MAC introducer Swan type and PA catheter depth:thermodilation and 45PA Cath depth:45 Procedure performed using ultrasound guided technique. Attempts: 3 Following insertion, line sutured and dressing applied. Post procedure assessment: blood return through all ports, free fluid flow and no air. Patient tolerated the procedure well with no immediate complications.

## 2015-10-28 NOTE — Anesthesia Procedure Notes (Signed)
Procedure Name: Intubation Date/Time: 10/28/2015 7:46 AM Performed by: Adonis HousekeeperNGELL, Cassadee Vanzandt M Pre-anesthesia Checklist: Patient identified, Emergency Drugs available, Suction available and Patient being monitored Patient Re-evaluated:Patient Re-evaluated prior to inductionOxygen Delivery Method: Circle system utilized Preoxygenation: Pre-oxygenation with 100% oxygen Intubation Type: IV induction Ventilation: Mask ventilation without difficulty and Oral airway inserted - appropriate to patient size Laryngoscope Size: Mac and 3 Grade View: Grade III Tube type: Oral Tube size: 7.5 mm Number of attempts: 1 Airway Equipment and Method: Stylet Placement Confirmation: ETT inserted through vocal cords under direct vision,  positive ETCO2 and breath sounds checked- equal and bilateral Secured at: 23 cm Tube secured with: Tape Dental Injury: Teeth and Oropharynx as per pre-operative assessment

## 2015-10-28 NOTE — Progress Notes (Signed)
Subjective:  Patient remains intubated more awake follows commands being weaned off. Tolerated CABG 3. Atrial paced rhythm on the monitor  Objective:  Vital Signs in the last 24 hours: Temp:  [96.6 F (35.9 C)-97.9 F (36.6 C)] 96.6 F (35.9 C) (09/25 1600) Pulse Rate:  [83-102] 86 (09/25 1600) Resp:  [12-22] 12 (09/25 1600) BP: (96-158)/(42-77) 133/77 (09/25 1600) SpO2:  [96 %-100 %] 100 % (09/25 1600) Arterial Line BP: (79-111)/(44-56) 110/53 (09/25 1600) FiO2 (%):  [50 %] 50 % (09/25 1232) Weight:  [139 lb 8.8 oz (63.3 kg)-145 lb 11.6 oz (66.1 kg)] 145 lb 11.6 oz (66.1 kg) (09/25 1245)  Intake/Output from previous day: No intake/output data recorded. Intake/Output from this shift: Total I/O In: 2382.9 [I.V.:1905.9; Blood:377; IV Piggyback:100] Out: 1810 [Urine:925; Emesis/NG output:20; Blood:700; Chest Tube:165]  Physical Exam: Neck: no adenopathy, no carotid bruit, no JVD and supple, symmetrical, trachea midline Lungs: Decreased breath sound at bases clear anteriorly Heart: regular rate and rhythm, S1, S2 normal and Soft systolic murmur noted Abdomen: soft, non-tender; bowel sounds normal; no masses,  no organomegaly Extremities: extremities normal, atraumatic, no cyanosis or edema and Surgical dressing was dry  Lab Results:  Recent Labs  10/27/15 0321  10/28/15 1029  10/28/15 1230 10/28/15 1232  WBC 6.7  --   --   --   --  13.0*  HGB 12.3*  < > 8.1*  < > 11.6* 11.4*  PLT 226  --  142*  --   --  139*  < > = values in this interval not displayed.  Recent Labs  10/26/15 0601 10/27/15 0321  10/28/15 0946 10/28/15 1039 10/28/15 1230  NA 138 138  < > 139 140 142  K 4.7 4.7  < > 4.1 5.4* 4.3  CL 112* 110  < > 106 107  --   CO2 20* 20*  --   --   --   --   GLUCOSE 305* 129*  < > 79 78 122*  BUN 20 19  < > 14 19  --   CREATININE 1.29* 1.27*  < > 0.60* 1.00  --   < > = values in this interval not displayed. No results for input(s): TROPONINI in the last 72  hours.  Invalid input(s): CK, MB Hepatic Function Panel  Recent Labs  10/27/15 0321  ALBUMIN 2.4*   No results for input(s): CHOL in the last 72 hours. No results for input(s): PROTIME in the last 72 hours.  Imaging: Imaging results have been reviewed and Dg Chest 2 View  Result Date: 10/27/2015 CLINICAL DATA:  60 year old male with a history of preoperative chest x-ray exam EXAM: CHEST  2 VIEW COMPARISON:  10/18/2015, 04/01/2015 FINDINGS: Cardiomediastinal silhouette borderline enlarged. Stents within coronary vasculature with associated calcifications. Calcifications of the aortic arch. Diffuse interstitial opacities of the bilateral lungs which are most pronounced in the mid and lower lungs. No pleural effusion. No confluent airspace disease. Similar appearance of vertebral bodies with lower thoracic compression fracture unchanged. No acute fracture line identified. IMPRESSION: Interstitial opacities of the bilateral lungs in the mid and lower lungs may reflect chronic interstitial fibrosis, though superimposed disease such as atypical pneumonia or edema difficult to exclude. Aortic atherosclerosis with evidence of coronary artery disease. Signed, Yvone NeuJaime S. Loreta AveWagner, DO [facility] Electronically Signed   By: Gilmer MorJaime  Wagner D.O.   On: 10/27/2015 18:04   Dg Chest Port 1 View  Result Date: 10/28/2015 CLINICAL DATA:  60 year old  male post CABG.  Subsequent encounter. EXAM: PORTABLE CHEST 1 VIEW COMPARISON:  10/27/2015 chest x-ray. FINDINGS: Endotracheal tube tip 1.9 cm above the carina. This will need to be monitored closely to exclude mainstem bronchus intubation with change in patient's neck position. Right-sided Swan-Ganz catheter tip main pulmonary artery level. Left-sided chest tube and mediastinal drains in place without evidence a pneumothorax. Post CABG.  Cardiomegaly. Pulmonary vascular prominence most notable centrally. Findings are  superimposed upon chronic changes noted on the preoperative exam. Left base minimal subsegmental atelectasis. IMPRESSION: Endotracheal tube tip 1.9 cm above the carina. This will need to be monitored closely to exclude mainstem bronchus intubation with change in patient's neck position. Right-sided Swan-Ganz catheter tip main pulmonary artery level. Left-sided chest tube and mediastinal drains in place without evidence a pneumothorax. Post CABG.  Cardiomegaly. Mild pulmonary vascular prominence superimposed upon chronic changes. Left base minimal subsegmental atelectasis. Electronically Signed   By: Lacy Duverney M.D.   On: 10/28/2015 13:38    Cardiac Studies:  Assessment/Plan:    Status post small non-Q-wave myocardial infarction Multivessel CAD status post multivessel PCI in the paststatus post left cath with aggressive in-stent restenosis in ostial LAD and proximal left circumflex and distal left main stenosis status post CABG 3 Ischemic cardiomyopathy Hypertension Diabetes mellitus Hyperlipidemia History of end-stage renal disease status post renal transplant Chronic kidney diseaseStage II Anemia of chronic disease Peripheral neuropathy Peripheral vascular disease Depression Plan Continue present management as per CVTS  LOS: 9 days    [name] 10/28/2015, 5:41 PM

## 2015-10-29 ENCOUNTER — Encounter (HOSPITAL_COMMUNITY): Payer: Self-pay | Admitting: Surgery

## 2015-10-29 ENCOUNTER — Inpatient Hospital Stay (HOSPITAL_COMMUNITY): Payer: PPO

## 2015-10-29 LAB — CBC
HEMATOCRIT: 34.2 % — AB (ref 39.0–52.0)
HEMATOCRIT: 31.1 % — AB (ref 39.0–52.0)
HEMOGLOBIN: 10.5 g/dL — AB (ref 13.0–17.0)
HEMOGLOBIN: 9.6 g/dL — AB (ref 13.0–17.0)
MCH: 26.9 pg (ref 26.0–34.0)
MCH: 27.3 pg (ref 26.0–34.0)
MCHC: 30.7 g/dL (ref 30.0–36.0)
MCHC: 30.9 g/dL (ref 30.0–36.0)
MCV: 87.5 fL (ref 78.0–100.0)
MCV: 88.4 fL (ref 78.0–100.0)
Platelets: 156 10*3/uL (ref 150–400)
Platelets: 135 10*3/uL — ABNORMAL LOW (ref 150–400)
RBC: 3.91 MIL/uL — ABNORMAL LOW (ref 4.22–5.81)
RBC: 3.52 MIL/uL — AB (ref 4.22–5.81)
RDW: 17.1 % — ABNORMAL HIGH (ref 11.5–15.5)
RDW: 17.4 % — AB (ref 11.5–15.5)
WBC: 15.3 10*3/uL — AB (ref 4.0–10.5)
WBC: 13.2 10*3/uL — AB (ref 4.0–10.5)

## 2015-10-29 LAB — POCT I-STAT, CHEM 8
BUN: 22 mg/dL — AB (ref 6–20)
CALCIUM ION: 1.24 mmol/L (ref 1.15–1.40)
CHLORIDE: 110 mmol/L (ref 101–111)
Creatinine, Ser: 1.6 mg/dL — ABNORMAL HIGH (ref 0.61–1.24)
Glucose, Bld: 120 mg/dL — ABNORMAL HIGH (ref 65–99)
HEMATOCRIT: 32 % — AB (ref 39.0–52.0)
Hemoglobin: 10.9 g/dL — ABNORMAL LOW (ref 13.0–17.0)
Potassium: 4.6 mmol/L (ref 3.5–5.1)
SODIUM: 141 mmol/L (ref 135–145)
TCO2: 20 mmol/L (ref 0–100)

## 2015-10-29 LAB — POCT I-STAT 3, ART BLOOD GAS (G3+)
ACID-BASE DEFICIT: 6 mmol/L — AB (ref 0.0–2.0)
Acid-base deficit: 7 mmol/L — ABNORMAL HIGH (ref 0.0–2.0)
BICARBONATE: 19.1 mmol/L — AB (ref 20.0–28.0)
Bicarbonate: 18.6 mmol/L — ABNORMAL LOW (ref 20.0–28.0)
O2 SAT: 93 %
O2 SAT: 95 %
PCO2 ART: 33.6 mmHg (ref 32.0–48.0)
PO2 ART: 65 mmHg — AB (ref 83.0–108.0)
Patient temperature: 36
TCO2: 20 mmol/L (ref 0–100)
TCO2: 20 mmol/L (ref 0–100)
pCO2 arterial: 37.3 mmHg (ref 32.0–48.0)
pH, Arterial: 7.317 — ABNORMAL LOW (ref 7.350–7.450)
pH, Arterial: 7.346 — ABNORMAL LOW (ref 7.350–7.450)
pO2, Arterial: 83 mmHg (ref 83.0–108.0)

## 2015-10-29 LAB — BASIC METABOLIC PANEL
ANION GAP: 5 (ref 5–15)
BUN: 19 mg/dL (ref 6–20)
CHLORIDE: 116 mmol/L — AB (ref 101–111)
CO2: 21 mmol/L — AB (ref 22–32)
Calcium: 8.3 mg/dL — ABNORMAL LOW (ref 8.9–10.3)
Creatinine, Ser: 1.51 mg/dL — ABNORMAL HIGH (ref 0.61–1.24)
GFR calc non Af Amer: 48 mL/min — ABNORMAL LOW (ref >60)
GFR, EST AFRICAN AMERICAN: 56 mL/min — AB (ref >60)
Glucose, Bld: 96 mg/dL (ref 65–99)
POTASSIUM: 4.1 mmol/L (ref 3.5–5.1)
Sodium: 142 mmol/L (ref 135–145)

## 2015-10-29 LAB — GLUCOSE, CAPILLARY
GLUCOSE-CAPILLARY: 101 mg/dL — AB (ref 65–99)
GLUCOSE-CAPILLARY: 106 mg/dL — AB (ref 65–99)
GLUCOSE-CAPILLARY: 133 mg/dL — AB (ref 65–99)
GLUCOSE-CAPILLARY: 148 mg/dL — AB (ref 65–99)
GLUCOSE-CAPILLARY: 151 mg/dL — AB (ref 65–99)
GLUCOSE-CAPILLARY: 199 mg/dL — AB (ref 65–99)
GLUCOSE-CAPILLARY: 88 mg/dL (ref 65–99)
GLUCOSE-CAPILLARY: 90 mg/dL (ref 65–99)
GLUCOSE-CAPILLARY: 98 mg/dL (ref 65–99)
Glucose-Capillary: 220 mg/dL — ABNORMAL HIGH (ref 65–99)
Glucose-Capillary: 124 mg/dL — ABNORMAL HIGH (ref 65–99)
Glucose-Capillary: 98 mg/dL (ref 65–99)
Glucose-Capillary: 133 mg/dL — ABNORMAL HIGH (ref 65–99)
Glucose-Capillary: 115 mg/dL — ABNORMAL HIGH (ref 65–99)

## 2015-10-29 LAB — CREATININE, SERUM
Creatinine, Ser: 1.63 mg/dL — ABNORMAL HIGH (ref 0.61–1.24)
GFR, EST AFRICAN AMERICAN: 51 mL/min — AB (ref >60)
GFR, EST NON AFRICAN AMERICAN: 44 mL/min — AB (ref >60)

## 2015-10-29 LAB — MAGNESIUM
MAGNESIUM: 2.4 mg/dL (ref 1.7–2.4)
Magnesium: 2.5 mg/dL — ABNORMAL HIGH (ref 1.7–2.4)

## 2015-10-29 MED ORDER — INSULIN ASPART 100 UNIT/ML ~~LOC~~ SOLN: 0.0000 [IU] | SUBCUTANEOUS | Status: DC

## 2015-10-29 MED ORDER — ENOXAPARIN SODIUM 40 MG/0.4ML ~~LOC~~ SOLN
40.0000 mg | Freq: Every day | SUBCUTANEOUS | Status: DC
  Administered 2015-10-29 – 2015-11-01 (×4): 40 mg via SUBCUTANEOUS
  Filled 2015-10-29 (×4): qty 0.4

## 2015-10-29 MED ORDER — INSULIN DETEMIR 100 UNIT/ML ~~LOC~~ SOLN
20.0000 [IU] | Freq: Every day | SUBCUTANEOUS | Status: DC
  Administered 2015-10-29 – 2015-11-01 (×4): 20 [IU] via SUBCUTANEOUS
  Filled 2015-10-29 (×5): qty 0.2

## 2015-10-29 MED ORDER — INSULIN DETEMIR 100 UNIT/ML ~~LOC~~ SOLN: 20.0000 [IU] | Freq: Every day | SUBCUTANEOUS | Status: DC

## 2015-10-29 MED FILL — Sodium Bicarbonate IV Soln 8.4%: INTRAVENOUS | Qty: 50 | Status: AC

## 2015-10-29 MED FILL — Electrolyte-R (PH 7.4) Solution: INTRAVENOUS | Qty: 3000 | Status: AC

## 2015-10-29 MED FILL — Sodium Chloride IV Soln 0.9%: INTRAVENOUS | Qty: 2000 | Status: AC

## 2015-10-29 MED FILL — Lidocaine HCl IV Inj 20 MG/ML: INTRAVENOUS | Qty: 5 | Status: AC

## 2015-10-29 MED FILL — Heparin Sodium (Porcine) Inj 1000 Unit/ML: INTRAMUSCULAR | Qty: 10 | Status: AC

## 2015-10-29 MED FILL — Mannitol IV Soln 20%: INTRAVENOUS | Qty: 500 | Status: AC

## 2015-10-29 NOTE — Progress Notes (Signed)
Subjective: Interval History: has no complaint, says feels fine, .  Objective: Vital signs in last 24 hours: Temp:  [96.4 F (35.8 C)-98.6 F (37 C)] 97 F (36.1 C) (09/26 0645) Pulse Rate:  [76-102] 85 (09/26 0645) Resp:  [12-34] 25 (09/26 0645) BP: (75-175)/(30-95) 92/43 (09/26 0645) SpO2:  [95 %-100 %] 98 % (09/26 0645) Arterial Line BP: (79-156)/(39-63) 110/45 (09/26 0645) FiO2 (%):  [40 %-50 %] 40 % (09/25 2055) Weight:  [66.1 kg (145 lb 11.6 oz)-68.7 kg (151 lb 7.3 oz)] 68.7 kg (151 lb 7.3 oz) (09/26 0500) Weight change: 2.8 kg (6 lb 2.8 oz)  Intake/Output from previous day: 09/25 0701 - 09/26 0700 In: 3162.5 [P.O.:20; I.V.:2615.5; Blood:377; IV Piggyback:150] Out: 2665 [Urine:1510; Emesis/NG output:20; Blood:700; Chest Tube:435] Intake/Output this shift: Total I/O In: 910.5 [P.O.:20; I.V.:840.5; IV Piggyback:50] Out: 745 [Urine:495; Chest Tube:250]  General appearance: alert, cooperative, no distress and post CABG, dressings Neck: R swan IJ Chest wall: R swan Cardio: S1, S2 normal and friction rub heard at SB GI: see below Extremities: active bs, TX RLQ soft  Chest wall mediastinal tubes 1 +edema  Lab Results:  Recent Labs  10/28/15 1845 10/29/15 0430  WBC 18.9* 15.3*  HGB 12.2*  12.9* 10.5*  HCT 38.5*  38.0* 34.2*  PLT 180 156   BMET:  Recent Labs  10/27/15 0321  10/28/15 1845 10/29/15 0430  NA 138  < > 142 142  K 4.7  < > 4.2 4.1  CL 110  < > 113* 116*  CO2 20*  --   --  21*  GLUCOSE 129*  < > 147* 96  BUN 19  < > 20 19  CREATININE 1.27*  < > 1.43*  1.20 1.51*  CALCIUM 8.8*  --   --  8.3*  < > = values in this interval not displayed. No results for input(s): PTH in the last 72 hours. Iron Studies: No results for input(s): IRON, TIBC, TRANSFERRIN, FERRITIN in the last 72 hours.  Studies/Results: Dg Chest 2 View  Result Date: 10/27/2015 CLINICAL DATA:  60 year old male with a history of preoperative chest x-ray exam EXAM: CHEST  2 VIEW  COMPARISON:  10/18/2015, 04/01/2015 FINDINGS: Cardiomediastinal silhouette borderline enlarged. Stents within coronary vasculature with associated calcifications. Calcifications of the aortic arch. Diffuse interstitial opacities of the bilateral lungs which are most pronounced in the mid and lower lungs. No pleural effusion. No confluent airspace disease. Similar appearance of vertebral bodies with lower thoracic compression fracture unchanged. No acute fracture line identified. IMPRESSION: Interstitial opacities of the bilateral lungs in the mid and lower lungs may reflect chronic interstitial fibrosis, though superimposed disease such as atypical pneumonia or edema difficult to exclude. Aortic atherosclerosis with evidence of coronary artery disease. Signed, Yvone NeuJaime S. Loreta AveWagner, DO [facility] Electronically Signed   By: Gilmer MorJaime  Wagner D.O.   On: 10/27/2015 18:04   Dg Chest Port 1 View  Result Date: 10/28/2015 CLINICAL DATA:  60 year old male post CABG.  Subsequent encounter. EXAM: PORTABLE CHEST 1 VIEW COMPARISON:  10/27/2015 chest x-ray. FINDINGS: Endotracheal tube tip 1.9 cm above the carina. This will need to be monitored closely to exclude mainstem bronchus intubation with change in patient's neck position. Right-sided Swan-Ganz catheter tip main pulmonary artery level. Left-sided chest tube and mediastinal drains in place without evidence a pneumothorax. Post CABG.  Cardiomegaly. Pulmonary vascular prominence most notable centrally. Findings are superimposed upon chronic changes noted on the preoperative exam. Left base minimal subsegmental atelectasis.  IMPRESSION: Endotracheal tube tip 1.9 cm above the carina. This will need to be monitored closely to exclude mainstem bronchus intubation with change in patient's neck position. Right-sided Swan-Ganz catheter tip main pulmonary artery level. Left-sided chest tube and mediastinal drains in place without  evidence a pneumothorax. Post CABG.  Cardiomegaly. Mild pulmonary vascular prominence superimposed upon chronic changes. Left base minimal subsegmental atelectasis. Electronically Signed   By: Lacy Duverney M.D.   On: 10/28/2015 13:38    I have reviewed the patient's current medications. Prior to Admission:  Prescriptions Prior to Admission  Medication Sig Dispense Refill Last Dose  . aspirin EC 81 MG tablet Take 81 mg by mouth daily.   10/18/2015 at Unknown time  . BD ULTRA-FINE PEN NEEDLES 29G X 12.7MM MISC USE AS DIRECTED 100 each 5 10/18/2015 at Unknown time  . brimonidine (ALPHAGAN) 0.2 % ophthalmic solution Place 1 drop into the left eye at bedtime.   10/17/2015  . carvedilol (COREG) 6.25 MG tablet Take 6.25 mg by mouth daily.   10/18/2015 at 1100  . cycloSPORINE (SANDIMMUNE) 100 MG capsule Take 100 mg by mouth 2 (two) times daily.    10/18/2015 at 2000  . dorzolamide-timolol (COSOPT) 22.3-6.8 MG/ML ophthalmic solution Apply 1 drop to eye at bedtime.   10/17/2015 at Unknown time  . fluconazole (DIFLUCAN) 150 MG tablet Take 150 mg by mouth daily.   10/18/2015 at Unknown time  . insulin detemir (LEVEMIR) 100 UNIT/ML injection Inject 25 Units into the skin 2 (two) times daily. And pen needles 1/day   10/18/2015 at BOTH DOSES  . latanoprost (XALATAN) 0.005 % ophthalmic solution Place 1 drop into the left eye at bedtime.   10/17/2015  . Multiple Vitamin (MULTIVITAMIN WITH MINERALS) TABS tablet Take 1 tablet by mouth daily. CENTRUM MULTIVITAMIN FOR 50+   10/18/2015 at Unknown time  . mycophenolate (MYFORTIC) 180 MG EC tablet Take 180 mg by mouth every evening.    10/18/2015 at Unknown time  . prasugrel (EFFIENT) 10 MG TABS tablet Take 1 tablet (10 mg total) by mouth daily. 30 tablet 0 10/18/2015 at Unknown time  . predniSONE (DELTASONE) 5 MG tablet Take 5 mg by mouth daily.   10/18/2015 at Unknown time  . rosuvastatin (CRESTOR) 10 MG tablet Take 10 mg by mouth daily.   10/18/2015 at Unknown time  .  rosuvastatin (CRESTOR) 5 MG tablet Take 1 tablet (5 mg total) by mouth daily at 6 PM. (Patient not taking: Reported on 10/19/2015) 30 tablet 0 Not Taking at Unknown time    Assessment/Plan: 1 REnal TX Cr stable. Vol xs mild, mild acidemia.  Will check CyA in am to see if metab changes. 2 CABG per [facility] 3 DM controlled 4 ^ lipids P follow chem,check CyA level, follow counts ,vol    LOS: 10 days   Katelynd Blauvelt L 10/29/2015,6:57 AM

## 2015-10-29 NOTE — Care Management Important Message (Signed)
Important Message  Patient Details  Name: Steven Riddle MRN: [id number] Date of Birth: Jan 28, 1956   Medicare Important Message Given:  Yes    Alaa Eyerman Abena 10/29/2015, 11:12 AM

## 2015-10-29 NOTE — Progress Notes (Signed)
Patient ID: Steven Riddle, male   DOB: Jun 18, 1955, 60 y.o.   MRN: [id number] EVENING ROUNDS NOTE :     [address]       [place] [id number]             [phone]                 1 Day Post-Op Procedure(s) (LRB): CORONARY ARTERY BYPASS GRAFTING (CABG)  X  3   UTILIZING THE LEFT INTERNAL MAMMARY ARTERY AND ENDOSCOPICALLY HARVESTED RIGHT SAPHENEOUS VEINS. (N/A) TRANSESOPHAGEAL ECHOCARDIOGRAM (TEE) (N/A)  Total Length of Stay:  LOS: 10 days  BP (!) 117/46   Pulse 82   Temp 97.9 F (36.6 C) (Oral)   Resp 17   Ht 5\' 7"  (1.702 m)   Wt 151 lb 7.3 oz (68.7 kg)   SpO2 98%   BMI 23.72 kg/m   .Intake/Output      09/25 0701 - 09/26 0700 09/26 0701 - 09/27 0700   P.O. 20    I.V. (mL/kg) 2667.7 (38.8) 192.4 (2.8)   Blood 377    IV Piggyback 150    Total Intake(mL/kg) 3214.7 (46.8) 192.4 (2.8)   Urine (mL/kg/hr) 1510 (0.9) 120 (0.2)   Emesis/NG output 20 (0)    Blood 700 (0.4)    Chest Tube 435 (0.3) 10 (0)   Total Output 2665 130   Net +549.7 +62.4          . sodium chloride 20 mL/hr at 10/29/15 0800  . sodium chloride    . sodium chloride 20 mL/hr at 10/29/15 0800  . DOPamine Stopped (10/29/15 1212)  . insulin (NOVOLIN-R) infusion Stopped (10/29/15 1410)  . lactated ringers    . lactated ringers    . milrinone Stopped (10/29/15 1213)  . phenylephrine (NEO-SYNEPHRINE) Adult infusion Stopped (10/29/15 0520)     Lab Results  Component Value Date   WBC 13.2 (H) 10/29/2015   HGB 10.9 (L) 10/29/2015   HCT 32.0 (L) 10/29/2015   PLT 135 (L) 10/29/2015   GLUCOSE 120 (H) 10/29/2015   CHOL 136 10/20/2015   TRIG 101 10/20/2015   HDL 29 (L) 10/20/2015   LDLCALC 87 10/20/2015   ALT 27 10/19/2015   AST 27 10/19/2015   NA 141 10/29/2015   K 4.6 10/29/2015   CL 110 10/29/2015   CREATININE 1.60 (H) 10/29/2015   BUN 22 (H) 10/29/2015   CO2 21 (L) 10/29/2015   TSH 1.597 10/19/2015   PSA 1.59 01/06/2012   INR 1.56 10/28/2015   HGBA1C 11.8 (H) 10/27/2015   MICROALBUR 155.7 (H) 02/10/2007    Up to chair  Foley out in am  Delight OvensEdward B Tiffanee Mcnee MD  Beeper [phone] Office [phone] 10/29/2015 6:18 PM

## 2015-10-29 NOTE — Progress Notes (Signed)
Dr. Donata ClayVan Trigt paged about pt's one hour post extubation ABG results. Orders received to give one amp of bicarb and recheck an ABG one hour later. Will implement and continue to monitor closely.

## 2015-10-29 NOTE — Care Management Note (Signed)
Case Management Note  Patient Details  Name: Elliot Cousinmanullah Banghart MRN: [id number] Date of Birth: [birth year]  Subjective/Objective: 2 days S/p CABG                    Action/Plan:  PTA independent at home with wife.  Pt states wife will provide 24 supervision post discharge as recommended.  PT has been ordered due to instability with mobility prior and during this admit.  CM will continue to follow for discharge needs   Expected Discharge Date:                  Expected Discharge Plan:  Home/Self Care  In-House Referral:     Discharge planning Services  CM Consult  Post Acute Care Choice:    Choice offered to:     DME Arranged:    DME Agency:     HH Arranged:    HH Agency:     Status of Service:  In process, will continue to follow  If discussed at Long Length of Stay Meetings, dates discussed:    Additional Comments:  Cherylann ParrClaxton, Daryn Pisani S, RN 10/29/2015, 11:30 AM

## 2015-10-29 NOTE — Progress Notes (Signed)
1 Day Post-Op Procedure(s) (LRB): CORONARY ARTERY BYPASS GRAFTING (CABG)  X  3   UTILIZING THE LEFT INTERNAL MAMMARY ARTERY AND ENDOSCOPICALLY HARVESTED RIGHT SAPHENEOUS VEINS. (N/A) TRANSESOPHAGEAL ECHOCARDIOGRAM (TEE) (N/A) Subjective:  No complaints  Objective: Vital signs in last 24 hours: Temp:  [96.4 F (35.8 C)-98.6 F (37 C)] 96.8 F (36 C) (09/26 0930) Pulse Rate:  [76-102] 83 (09/26 0930) Cardiac Rhythm: Atrial paced;Normal sinus rhythm (09/26 0800) Resp:  [11-34] 16 (09/26 0930) BP: (70-175)/(30-95) 105/48 (09/26 0930) SpO2:  [95 %-100 %] 98 % (09/26 0930) Arterial Line BP: (79-156)/(39-63) 126/52 (09/26 0930) FiO2 (%):  [40 %-50 %] 40 % (09/25 2055) Weight:  [66.1 kg (145 lb 11.6 oz)-68.7 kg (151 lb 7.3 oz)] 68.7 kg (151 lb 7.3 oz) (09/26 0500)  Hemodynamic parameters for last 24 hours: PAP: (34-60)/(8-26) 51/22 CO:  [3.5 L/min-5.2 L/min] 5.2 L/min CI:  [2 L/min/m2-3.2 L/min/m2] 2.9 L/min/m2  Intake/Output from previous day: 09/25 0701 - 09/26 0700 In: 3214.7 [P.O.:20; I.V.:2667.7; Blood:377; IV Piggyback:150] Out: 2665 [Urine:1510; Emesis/NG output:20; Blood:700; Chest Tube:435] Intake/Output this shift: Total I/O In: 97.7 [I.V.:97.7] Out: 85 [Urine:75; Chest Tube:10]  General appearance: alert and cooperative Neurologic: intact Heart: regular rate and rhythm and 1/6 systolic murmur RSB Lungs: diminished in bases Extremities: edema mild Wound: dressings dry  Lab Results:  Recent Labs  10/28/15 1845 10/29/15 0430  WBC 18.9* 15.3*  HGB 12.2*  12.9* 10.5*  HCT 38.5*  38.0* 34.2*  PLT 180 156   BMET:  Recent Labs  10/27/15 0321  10/28/15 1845 10/29/15 0430  NA 138  < > 142 142  K 4.7  < > 4.2 4.1  CL 110  < > 113* 116*  CO2 20*  --   --  21*  GLUCOSE 129*  < > 147* 96  BUN 19  < > 20 19  CREATININE 1.27*  < > 1.43*  1.20 1.51*  CALCIUM 8.8*  --   --  8.3*  < > = values in this interval not displayed.  PT/INR:  Recent Labs   10/28/15 1232  LABPROT 18.8*  INR 1.56   ABG    Component Value Date/Time   PHART 7.346 (L) 10/29/2015 0436   HCO3 18.6 (L) 10/29/2015 0436   TCO2 20 10/29/2015 0436   ACIDBASEDEF 7.0 (H) 10/29/2015 0436   O2SAT 93.0 10/29/2015 0436   CBG (last 3)   Recent Labs  10/29/15 0616 10/29/15 0818 10/29/15 0902  GLUCAP 88 98 101*   CLINICAL DATA:  Post CABG  EXAM: PORTABLE CHEST 1 VIEW  COMPARISON:  Portable chest x-ray of 10/28/2015  FINDINGS: The endotracheal tube is been removed. The lungs are poorly aerated with basilar opacities right much greater than left, most likely representing atelectasis and possibly a right pleural effusion. Heart size is stable.  IMPRESSION: 1. Endotracheal tube removed with decrease in aeration. 2. Basilar opacities right greater than left most consistent with atelectasis and possibly right pleural effusion.   Electronically Signed   By: Dwyane DeePaul  Barry M.D.   On: 10/29/2015 08:32  ECG: sinus, RBBB, no acute changes Assessment/Plan: S/P Procedure(s) (LRB): CORONARY ARTERY BYPASS GRAFTING (CABG)  X  3   UTILIZING THE LEFT INTERNAL MAMMARY ARTERY AND ENDOSCOPICALLY HARVESTED RIGHT SAPHENEOUS VEINS. (N/A) TRANSESOPHAGEAL ECHOCARDIOGRAM (TEE) (N/A)  He is hemodynamically stable in sinus rhythm. Wean dopamine and milrinone.  Work on IS.   Renal function stable. Nephrology following.  Remove chest tubes, swan and arterial line.  Mobilize. Will get PT consult  since he was moving slowly preop and has some balance issues.   LOS: 10 days    Alleen Borne 10/29/2015

## 2015-10-29 NOTE — Progress Notes (Signed)
PT Cancellation Note  Patient Details Name: Steven Riddle MRN: [id number] DOB: 04-Sep-1955   Cancelled Treatment:    Reason Eval/Treat Not Completed: Medical issues which prohibited therapy (orders received, pt still with Theone MurdochSwan Ganz in place. Will attempt next date)   Delorse Lekabor, Kirk Basquez Beth 10/29/2015, 1:42 PM Delaney MeigsMaija Tabor Adlai Nieblas, PT [phone]

## 2015-10-29 NOTE — Progress Notes (Signed)
Subjective:  Doing well denies any chest pain or shortness of breath. Successfully extubated last night. Being weaned off pressors.  Objective:  Vital Signs in the last 24 hours: Temp:  [96.4 F (35.8 C)-98.6 F (37 C)] 96.8 F (36 C) (09/26 0930) Pulse Rate:  [76-102] 83 (09/26 0930) Resp:  [11-34] 16 (09/26 0930) BP: (70-175)/(30-95) 105/48 (09/26 0930) SpO2:  [95 %-100 %] 98 % (09/26 0930) Arterial Line BP: (79-156)/(39-63) 126/52 (09/26 0930) FiO2 (%):  [40 %-50 %] 40 % (09/25 2055) Weight:  [145 lb 11.6 oz (66.1 kg)-151 lb 7.3 oz (68.7 kg)] 151 lb 7.3 oz (68.7 kg) (09/26 0500)  Intake/Output from previous day: 09/25 0701 - 09/26 0700 In: 3214.7 [P.O.:20; I.V.:2667.7; Blood:377; IV Piggyback:150] Out: 2665 [Urine:1510; Emesis/NG output:20; Blood:700; Chest Tube:435] Intake/Output from this shift: Total I/O In: 140.9 [I.V.:140.9] Out: 130 [Urine:120; Chest Tube:10]  Physical Exam: Neck: no adenopathy, no carotid bruit, no JVD and supple, symmetrical, trachea midline Lungs: Decreased breath sound at bases with faint rales Heart: regular rate and rhythm, S1, S2 normal and Soft systolic murmur noted Abdomen: soft, non-tender; bowel sounds normal; no masses,  no organomegaly Extremities: extremities normal, atraumatic, no cyanosis or edema  Lab Results:  Recent Labs  10/28/15 1845 10/29/15 0430  WBC 18.9* 15.3*  HGB 12.2*  12.9* 10.5*  PLT 180 156    Recent Labs  10/27/15 0321  10/28/15 1845 10/29/15 0430  NA 138  < > 142 142  K 4.7  < > 4.2 4.1  CL 110  < > 113* 116*  CO2 20*  --   --  21*  GLUCOSE 129*  < > 147* 96  BUN 19  < > 20 19  CREATININE 1.27*  < > 1.43*  1.20 1.51*  < > = values in this interval not displayed. No results for input(s): TROPONINI in the last 72 hours.  Invalid input(s): CK, MB Hepatic Function Panel  Recent Labs  10/27/15 0321  ALBUMIN 2.4*   No results for input(s): CHOL in the last 72 hours. No results for input(s):  PROTIME in the last 72 hours.  Imaging: Imaging results have been reviewed and Dg Chest 2 View  Result Date: 10/27/2015 CLINICAL DATA:  60 year old male with a history of preoperative chest x-ray exam EXAM: CHEST  2 VIEW COMPARISON:  10/18/2015, 04/01/2015 FINDINGS: Cardiomediastinal silhouette borderline enlarged. Stents within coronary vasculature with associated calcifications. Calcifications of the aortic arch. Diffuse interstitial opacities of the bilateral lungs which are most pronounced in the mid and lower lungs. No pleural effusion. No confluent airspace disease. Similar appearance of vertebral bodies with lower thoracic compression fracture unchanged. No acute fracture line identified. IMPRESSION: Interstitial opacities of the bilateral lungs in the mid and lower lungs may reflect chronic interstitial fibrosis, though superimposed disease such as atypical pneumonia or edema difficult to exclude. Aortic atherosclerosis with evidence of coronary artery disease. Signed, Yvone Neu. Loreta Ave, DO [facility] Electronically Signed   By: Gilmer Mor D.O.   On: 10/27/2015 18:04   Dg Chest Port 1 View  Result Date: 10/29/2015 CLINICAL DATA:  Post CABG EXAM: PORTABLE CHEST 1 VIEW COMPARISON:  Portable chest x-ray of 10/28/2015 FINDINGS: The endotracheal tube is been removed. The lungs are poorly aerated with basilar opacities right much greater than left, most likely representing atelectasis and possibly a right pleural effusion. Heart size is stable. IMPRESSION: 1. Endotracheal tube removed with decrease in aeration. 2. Basilar opacities right greater than  left most consistent with atelectasis and possibly right pleural effusion. Electronically Signed   By: Dwyane DeePaul  Barry M.D.   On: 10/29/2015 08:32   Dg Chest Port 1 View  Result Date: 10/28/2015 CLINICAL DATA:  60 year old male post CABG.  Subsequent encounter. EXAM: PORTABLE CHEST 1 VIEW  COMPARISON:  10/27/2015 chest x-ray. FINDINGS: Endotracheal tube tip 1.9 cm above the carina. This will need to be monitored closely to exclude mainstem bronchus intubation with change in patient's neck position. Right-sided Swan-Ganz catheter tip main pulmonary artery level. Left-sided chest tube and mediastinal drains in place without evidence a pneumothorax. Post CABG.  Cardiomegaly. Pulmonary vascular prominence most notable centrally. Findings are superimposed upon chronic changes noted on the preoperative exam. Left base minimal subsegmental atelectasis. IMPRESSION: Endotracheal tube tip 1.9 cm above the carina. This will need to be monitored closely to exclude mainstem bronchus intubation with change in patient's neck position. Right-sided Swan-Ganz catheter tip main pulmonary artery level. Left-sided chest tube and mediastinal drains in place without evidence a pneumothorax. Post CABG.  Cardiomegaly. Mild pulmonary vascular prominence superimposed upon chronic changes. Left base minimal subsegmental atelectasis. Electronically Signed   By: Lacy DuverneySteven  Olson M.D.   On: 10/28/2015 13:38    Cardiac Studies:  Assessment/Plan:  Status post small non-Q-wave myocardial infarction Multivessel CAD status post multivessel PCI in the paststatus post left cath with aggressive in-stent restenosis in ostial LAD and proximal left circumflex and distal left main stenosis status post CABG 3 postop day 1 doing well  Ischemic cardiomyopathy Hypertension Diabetes mellitus Hyperlipidemia History of end-stage renal disease status post renal transplant Chronic kidney diseaseStage II Anemia of chronic disease Peripheral neuropathy Peripheral vascular disease Depression Postop atelectasis/mild volume overload Plan Continue present management per CVTS Encouraged incentive spirometry Check labs in a.m. Out of bed to chair  LOS: 10 days    [name], Ople Girgis 10/29/2015, 10:31 AM

## 2015-10-30 ENCOUNTER — Inpatient Hospital Stay (HOSPITAL_COMMUNITY): Payer: PPO

## 2015-10-30 LAB — URINE MICROSCOPIC-ADD ON: BACTERIA UA: NONE SEEN

## 2015-10-30 LAB — GLUCOSE, CAPILLARY
GLUCOSE-CAPILLARY: 112 mg/dL — AB (ref 65–99)
Glucose-Capillary: 102 mg/dL — ABNORMAL HIGH (ref 65–99)
Glucose-Capillary: 103 mg/dL — ABNORMAL HIGH (ref 65–99)
Glucose-Capillary: 117 mg/dL — ABNORMAL HIGH (ref 65–99)
Glucose-Capillary: 118 mg/dL — ABNORMAL HIGH (ref 65–99)
Glucose-Capillary: 94 mg/dL (ref 65–99)
Glucose-Capillary: 98 mg/dL (ref 65–99)

## 2015-10-30 LAB — URINALYSIS, ROUTINE W REFLEX MICROSCOPIC
BILIRUBIN URINE: NEGATIVE
GLUCOSE, UA: NEGATIVE mg/dL
KETONES UR: NEGATIVE mg/dL
Leukocytes, UA: NEGATIVE
Nitrite: NEGATIVE
PH: 5 (ref 5.0–8.0)
PROTEIN: 100 mg/dL — AB
Specific Gravity, Urine: 1.03 (ref 1.005–1.030)

## 2015-10-30 LAB — CBC
HEMATOCRIT: 33.3 % — AB (ref 39.0–52.0)
Hemoglobin: 9.9 g/dL — ABNORMAL LOW (ref 13.0–17.0)
MCH: 26.8 pg (ref 26.0–34.0)
MCHC: 29.7 g/dL — ABNORMAL LOW (ref 30.0–36.0)
MCV: 90.2 fL (ref 78.0–100.0)
Platelets: 142 10*3/uL — ABNORMAL LOW (ref 150–400)
RBC: 3.69 MIL/uL — ABNORMAL LOW (ref 4.22–5.81)
RDW: 18 % — AB (ref 11.5–15.5)
WBC: 13.6 10*3/uL — AB (ref 4.0–10.5)

## 2015-10-30 LAB — RENAL FUNCTION PANEL
Albumin: 2 g/dL — ABNORMAL LOW (ref 3.5–5.0)
Anion gap: 9 (ref 5–15)
BUN: 25 mg/dL — AB (ref 6–20)
CHLORIDE: 111 mmol/L (ref 101–111)
CO2: 20 mmol/L — ABNORMAL LOW (ref 22–32)
Calcium: 8.4 mg/dL — ABNORMAL LOW (ref 8.9–10.3)
Creatinine, Ser: 1.92 mg/dL — ABNORMAL HIGH (ref 0.61–1.24)
GFR calc Af Amer: 42 mL/min — ABNORMAL LOW (ref >60)
GFR, EST NON AFRICAN AMERICAN: 36 mL/min — AB (ref >60)
Glucose, Bld: 112 mg/dL — ABNORMAL HIGH (ref 65–99)
POTASSIUM: 4.8 mmol/L (ref 3.5–5.1)
Phosphorus: 6.3 mg/dL — ABNORMAL HIGH (ref 2.5–4.6)
Sodium: 140 mmol/L (ref 135–145)

## 2015-10-30 MED ORDER — PREDNISONE 20 MG PO TABS
40.0000 mg | ORAL_TABLET | Freq: Every day | ORAL | Status: DC
  Administered 2015-10-30 – 2015-11-02 (×4): 40 mg via ORAL
  Filled 2015-10-30 (×2): qty 2
  Filled 2015-10-30 (×2): qty 4

## 2015-10-30 NOTE — Progress Notes (Signed)
Subjective:  Up in chair.  Doing well.  Denies any chest pain or shortness of breath.  Had small bump in creatinine probably due to hypotension yesterday.  Blood pressure improved overall feels good.  Objective:  Vital Signs in the last 24 hours: Temp:  [97.5 F (36.4 C)-98.3 F (36.8 C)] 98 F (36.7 C) (09/27 0800) Pulse Rate:  [68-79] 76 (09/27 1200) Resp:  [5-15] 13 (09/27 1200) BP: (76-125)/(50-76) 113/63 (09/27 1200) SpO2:  [88 %-100 %] 100 % (09/27 1200) Weight:  [152 lb 1.9 oz (69 kg)] 152 lb 1.9 oz (69 kg) (09/27 0500)  Intake/Output from previous day: 09/26 0701 - 09/27 0700 In: 872.4 [P.O.:60; I.V.:812.4] Out: 515 [Urine:505; Chest Tube:10] Intake/Output from this shift: Total I/O In: 340 [P.O.:240; I.V.:100] Out: -   Physical Exam: Neck: no adenopathy, no carotid bruit, no JVD and supple, symmetrical, trachea midline Lungs: decreased breath sounds at bases.  Air entry has improved Heart: regular rate and rhythm, S1, S2 normal and soft systolic murmur noted Abdomen: soft, non-tender; bowel sounds normal; no masses,  no organomegaly Extremities: extremities normal, atraumatic, no cyanosis or edema  Lab Results:  Recent Labs  10/29/15 1627 10/29/15 1630 10/30/15 0420  WBC 13.2*  --  13.6*  HGB 9.6* 10.9* 9.9*  PLT 135*  --  142*    Recent Labs  10/29/15 0430  10/29/15 1630 10/30/15 0420  NA 142  --  141 140  K 4.1  --  4.6 4.8  CL 116*  --  110 111  CO2 21*  --   --  20*  GLUCOSE 96  --  120* 112*  BUN 19  --  22* 25*  CREATININE 1.51*  < > 1.60* 1.92*  < > = values in this interval not displayed. No results for input(s): TROPONINI in the last 72 hours.  Invalid input(s): CK, MB Hepatic Function Panel  Recent Labs  10/30/15 0420  ALBUMIN 2.0*   No results for input(s): CHOL in the last 72 hours. No results for input(s): PROTIME in the last 72 hours.  Imaging: Imaging results have been reviewed and Dg Chest Port 1 View  Result Date:  10/30/2015 CLINICAL DATA:  Status post coronary artery bypass grafting coronary artery disease EXAM: PORTABLE CHEST 1 VIEW COMPARISON:  October 29, 2015 FINDINGS: Swan-Ganz catheter has been removed. Cordis tip is in the superior vena cava. Left chest tube has been removed. Mediastinal drain has been removed. Temporary pacemaker wires remain attached to the right heart. No pneumothorax evident. There is patchy airspace opacity in the right lower lobe with minimal right pleural effusion. The left lung is clear. Heart is mildly enlarged, stable. The pulmonary vascularity is normal. No adenopathy. IMPRESSION: No pneumothorax. Cordis tip in superior vena cava. Airspace opacity in the right lower lobe consistent with atelectasis with questionable superimposed pneumonia. Lungs elsewhere clear. Stable cardiac prominence. Electronically Signed   By: Bretta Bang III M.D.   On: 10/30/2015 07:44   Dg Chest Port 1 View  Result Date: 10/29/2015 CLINICAL DATA:  Post CABG EXAM: PORTABLE CHEST 1 VIEW COMPARISON:  Portable chest x-ray of 10/28/2015 FINDINGS: The endotracheal tube is been removed. The lungs are poorly aerated with basilar opacities right much greater than left, most likely representing atelectasis and possibly a right pleural effusion. Heart size is stable. IMPRESSION: 1. Endotracheal tube removed with decrease in aeration. 2. Basilar opacities right greater than left most consistent with atelectasis and possibly right pleural effusion. Electronically Signed  By: Dwyane DeePaul  Barry M.D.   On: 10/29/2015 08:32   Dg Chest Port 1 View  Result Date: 10/28/2015 CLINICAL DATA:  60 year old male post CABG.  Subsequent encounter. EXAM: PORTABLE CHEST 1 VIEW COMPARISON:  10/27/2015 chest x-ray. FINDINGS: Endotracheal tube tip 1.9 cm above the carina. This will need to be monitored closely to exclude mainstem bronchus intubation with change in patient's neck position. Right-sided Swan-Ganz catheter tip main pulmonary  artery level. Left-sided chest tube and mediastinal drains in place without evidence a pneumothorax. Post CABG.  Cardiomegaly. Pulmonary vascular prominence most notable centrally. Findings are superimposed upon chronic changes noted on the preoperative exam. Left base minimal subsegmental atelectasis. IMPRESSION: Endotracheal tube tip 1.9 cm above the carina. This will need to be monitored closely to exclude mainstem bronchus intubation with change in patient's neck position. Right-sided Swan-Ganz catheter tip main pulmonary artery level. Left-sided chest tube and mediastinal drains in place without evidence a pneumothorax. Post CABG.  Cardiomegaly. Mild pulmonary vascular prominence superimposed upon chronic changes. Left base minimal subsegmental atelectasis. Electronically Signed   By: Lacy DuverneySteven  Olson M.D.   On: 10/28/2015 13:38    Cardiac Studies:  Assessment/Plan:  Status postsmall non-Q-wave myocardial infarction Multivessel CAD status post multivessel PCI in the paststatus post left cath with aggressive in-stent restenosis in ostial LAD and proximal left circumflex and distal left main stenosisstatus post CABG 3 postop day 2 doing well  Ischemic cardiomyopathy Hypertension Diabetes mellitus Hyperlipidemia History of end-stage renal disease status post renal transplant Chronic kidney diseaseStage II Anemia of chronic disease Peripheral neuropathy Peripheral vascular disease Depression Postop atelectasis/mild volume overload Plan Continue present management. Increase ambulation. Encourage incentive spirometry  LOS: 11 days    [name], Arieona Swaggerty 10/30/2015, 12:34 PM

## 2015-10-30 NOTE — Progress Notes (Signed)
Subjective: Interval History: has no complaint .  Objective: Vital signs in last 24 hours: Temp:  [96.8 F (36 C)-98.3 F (36.8 C)] 98.3 F (36.8 C) (09/27 0400) Pulse Rate:  [68-88] 78 (09/27 0700) Resp:  [6-21] 8 (09/27 0700) BP: (70-132)/(46-76) 115/59 (09/27 0700) SpO2:  [91 %-100 %] 91 % (09/27 0700) Arterial Line BP: (109-150)/(46-55) 150/55 (09/26 1200) Weight:  [69 kg (152 lb 1.9 oz)] 69 kg (152 lb 1.9 oz) (09/27 0500) Weight change: 2.9 kg (6 lb 6.3 oz)  Intake/Output from previous day: 09/26 0701 - 09/27 0700 In: 872.4 [P.O.:60; I.V.:812.4] Out: 515 [Urine:505; Chest Tube:10] Intake/Output this shift: No intake/output data recorded.  General appearance: alert, cooperative and up in chair Resp: rales bibasilar and rhonchi bibasilar Cardio: S1, S2 normal and systolic murmur: holosystolic 2/6, blowing at apex GI: pos bs, TX RLQ Normal size and nontender Extremities: edema 1+  Lab Results:  Recent Labs  10/29/15 1627 10/29/15 1630 10/30/15 0420  WBC 13.2*  --  13.6*  HGB 9.6* 10.9* 9.9*  HCT 31.1* 32.0* 33.3*  PLT 135*  --  142*   BMET:  Recent Labs  10/29/15 0430  10/29/15 1630 10/30/15 0420  NA 142  --  141 140  K 4.1  --  4.6 4.8  CL 116*  --  110 111  CO2 21*  --   --  20*  GLUCOSE 96  --  120* 112*  BUN 19  --  22* 25*  CREATININE 1.51*  < > 1.60* 1.92*  CALCIUM 8.3*  --   --  8.4*  < > = values in this interval not displayed. No results for input(s): PTH in the last 72 hours. Iron Studies: No results for input(s): IRON, TIBC, TRANSFERRIN, FERRITIN in the last 72 hours.  Studies/Results: Dg Chest Port 1 View  Result Date: 10/29/2015 CLINICAL DATA:  Post CABG EXAM: PORTABLE CHEST 1 VIEW COMPARISON:  Portable chest x-ray of 10/28/2015 FINDINGS: The endotracheal tube is been removed. The lungs are poorly aerated with basilar opacities right much greater than left, most likely representing atelectasis and possibly a right pleural effusion. Heart  size is stable. IMPRESSION: 1. Endotracheal tube removed with decrease in aeration. 2. Basilar opacities right greater than left most consistent with atelectasis and possibly right pleural effusion. Electronically Signed   By: Dwyane Dee M.D.   On: 10/29/2015 08:32   Dg Chest Port 1 View  Result Date: 10/28/2015 CLINICAL DATA:  60 year old male post CABG.  Subsequent encounter. EXAM: PORTABLE CHEST 1 VIEW COMPARISON:  10/27/2015 chest x-ray. FINDINGS: Endotracheal tube tip 1.9 cm above the carina. This will need to be monitored closely to exclude mainstem bronchus intubation with change in patient's neck position. Right-sided Swan-Ganz catheter tip main pulmonary artery level. Left-sided chest tube and mediastinal drains in place without evidence a pneumothorax. Post CABG.  Cardiomegaly. Pulmonary vascular prominence most notable centrally. Findings are superimposed upon chronic changes noted on the preoperative exam. Left base minimal subsegmental atelectasis. IMPRESSION: Endotracheal tube tip 1.9 cm above the carina. This will need to be monitored closely to exclude mainstem bronchus intubation with change in patient's neck position. Right-sided Swan-Ganz catheter tip main pulmonary artery level. Left-sided chest tube and mediastinal drains in place without evidence a pneumothorax. Post CABG.  Cardiomegaly. Mild pulmonary vascular prominence superimposed upon chronic changes. Left base minimal subsegmental atelectasis. Electronically Signed   By: Lacy Duverney M.D.   On: 10/28/2015 13:38    I have reviewed the patient's current  medications. Prior to Admission:  Prescriptions Prior to Admission  Medication Sig Dispense Refill Last Dose  . aspirin EC 81 MG tablet Take 81 mg by mouth daily.   10/18/2015 at Unknown time  . BD ULTRA-FINE PEN NEEDLES 29G X 12.7MM MISC USE AS DIRECTED 100 each 5 10/18/2015 at Unknown time  . brimonidine (ALPHAGAN) 0.2 % ophthalmic solution Place 1 drop into the left eye at  bedtime.   10/17/2015  . carvedilol (COREG) 6.25 MG tablet Take 6.25 mg by mouth daily.   10/18/2015 at 1100  . cycloSPORINE (SANDIMMUNE) 100 MG capsule Take 100 mg by mouth 2 (two) times daily.    10/18/2015 at 2000  . dorzolamide-timolol (COSOPT) 22.3-6.8 MG/ML ophthalmic solution Apply 1 drop to eye at bedtime.   10/17/2015 at Unknown time  . fluconazole (DIFLUCAN) 150 MG tablet Take 150 mg by mouth daily.   10/18/2015 at Unknown time  . insulin detemir (LEVEMIR) 100 UNIT/ML injection Inject 25 Units into the skin 2 (two) times daily. And pen needles 1/day   10/18/2015 at BOTH DOSES  . latanoprost (XALATAN) 0.005 % ophthalmic solution Place 1 drop into the left eye at bedtime.   10/17/2015  . Multiple Vitamin (MULTIVITAMIN WITH MINERALS) TABS tablet Take 1 tablet by mouth daily. CENTRUM MULTIVITAMIN FOR 50+   10/18/2015 at Unknown time  . mycophenolate (MYFORTIC) 180 MG EC tablet Take 180 mg by mouth every evening.    10/18/2015 at Unknown time  . prasugrel (EFFIENT) 10 MG TABS tablet Take 1 tablet (10 mg total) by mouth daily. 30 tablet 0 10/18/2015 at Unknown time  . predniSONE (DELTASONE) 5 MG tablet Take 5 mg by mouth daily.   10/18/2015 at Unknown time  . rosuvastatin (CRESTOR) 10 MG tablet Take 10 mg by mouth daily.   10/18/2015 at Unknown time  . rosuvastatin (CRESTOR) 5 MG tablet Take 1 tablet (5 mg total) by mouth daily at 6 PM. 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  ? Functionally adrenal insuffic. Will give ^ steroids 2 CAD/CABG per [facility] 3 DM will go up with steroids.  4 Anemia stable P steroids, CyA level, follow Cr, urine, bps    LOS: 11 days   Steven Riddle L 10/30/2015,7:18 AM

## 2015-10-30 NOTE — Progress Notes (Signed)
2 Days Post-Op Procedure(s) (LRB): CORONARY ARTERY BYPASS GRAFTING (CABG)  X  3   UTILIZING THE LEFT INTERNAL MAMMARY ARTERY AND ENDOSCOPICALLY HARVESTED RIGHT SAPHENEOUS VEINS. (N/A) TRANSESOPHAGEAL ECHOCARDIOGRAM (TEE) (N/A) Subjective:  No complaints. Has not walked postop  Objective: Vital signs in last 24 hours: Temp:  [96.8 F (36 C)-98.3 F (36.8 C)] 98.3 F (36.8 C) (09/27 0400) Pulse Rate:  [68-88] 78 (09/27 0700) Cardiac Rhythm: Normal sinus rhythm;Bundle branch block (09/27 0400) Resp:  [6-21] 8 (09/27 0700) BP: (76-132)/(46-76) 115/59 (09/27 0700) SpO2:  [91 %-100 %] 91 % (09/27 0700) Arterial Line BP: (109-150)/(46-55) 150/55 (09/26 1200) Weight:  [69 kg (152 lb 1.9 oz)] 69 kg (152 lb 1.9 oz) (09/27 0500)  Hemodynamic parameters for last 24 hours: PAP: (46-55)/(16-25) 55/24 CO:  [5.2 L/min] 5.2 L/min CI:  [2.9 L/min/m2] 2.9 L/min/m2  Intake/Output from previous day: 09/26 0701 - 09/27 0700 In: 872.4 [P.O.:60; I.V.:812.4] Out: 515 [Urine:505; Chest Tube:10] Intake/Output this shift: No intake/output data recorded.  General appearance: alert and cooperative Neurologic: intact Heart: regular rate and rhythm, S1, S2 normal, no murmur, click, rub or gallop Lungs: diminished breath sounds bibasilar Extremities: edema mild Wound: dressings dry  Lab Results:  Recent Labs  10/29/15 1627 10/29/15 1630 10/30/15 0420  WBC 13.2*  --  13.6*  HGB 9.6* 10.9* 9.9*  HCT 31.1* 32.0* 33.3*  PLT 135*  --  142*   BMET:  Recent Labs  10/29/15 0430  10/29/15 1630 10/30/15 0420  NA 142  --  141 140  K 4.1  --  4.6 4.8  CL 116*  --  110 111  CO2 21*  --   --  20*  GLUCOSE 96  --  120* 112*  BUN 19  --  22* 25*  CREATININE 1.51*  < > 1.60* 1.92*  CALCIUM 8.3*  --   --  8.4*  < > = values in this interval not displayed.  PT/INR:  Recent Labs  10/28/15 1232  LABPROT 18.8*  INR 1.56   ABG    Component Value Date/Time   PHART 7.346 (L) 10/29/2015 0436   HCO3 18.6 (L) 10/29/2015 0436   TCO2 20 10/29/2015 1630   ACIDBASEDEF 7.0 (H) 10/29/2015 0436   O2SAT 93.0 10/29/2015 0436   CBG (last 3)   Recent Labs  10/29/15 1939 10/30/15 0011 10/30/15 0404  GLUCAP 90 118* 102*   CXR: atelectasis or infiltrate right mid lung. Pulmonary vascular congestion  Assessment/Plan: S/P Procedure(s) (LRB): CORONARY ARTERY BYPASS GRAFTING (CABG)  X  3   UTILIZING THE LEFT INTERNAL MAMMARY ARTERY AND ENDOSCOPICALLY HARVESTED RIGHT SAPHENEOUS VEINS. (N/A) TRANSESOPHAGEAL ECHOCARDIOGRAM (TEE) (N/A)  He is hemodynamically stable this am. BP was in the 90's yesterday after milrinone and dopamine weaned off but now 120. Hold off on beta blockers. Preop EF moderately depressed at 30-35%.  Kidney transplant: creat up today probably due to lower BP yesterday. UO ok. Steroids increased by nephrology. Will keep foley in today to monitor urine output. Hold off on diuresis.  Mobilize, IS  Expected acute postop blood loss anemia: stable, observe.  DM: glucose under good control. Continue Levemir and SSI.   LOS: 11 days    Alleen BorneBryan K Jamerson Vonbargen 10/30/2015

## 2015-10-30 NOTE — Progress Notes (Signed)
[facility] BRIEF SICU PROGRESS NOTE  2 Days Post-Op  S/P Procedure(s) (LRB): CORONARY ARTERY BYPASS GRAFTING (CABG)  X  3   UTILIZING THE LEFT INTERNAL MAMMARY ARTERY AND ENDOSCOPICALLY HARVESTED RIGHT SAPHENEOUS VEINS. (N/A) TRANSESOPHAGEAL ECHOCARDIOGRAM (TEE) (N/A)   Stable day NSR w/ stable BP Breathing comfortably w/ O2 sats 98-99% on 2 L/min UOP adequate  Plan: Continue current plan  Purcell Nailslarence H Owen, MD 10/30/2015 5:24 PM

## 2015-10-30 NOTE — Evaluation (Signed)
Physical Therapy Evaluation Patient Details Name: Steven Riddle MRN: [id number] DOB: 1955/08/19 Today's Date: 10/30/2015   History of Present Illness  The patient is a 60 year old [place] gentleman with a history of poorly controlled DM with peripheral neuropathy and ESRD, hypertension, s/p renal transplant as well as severe multi-vessel coronary artery disease s/p PCI/stenting, was admitted on 10/18/15 with SOB, cough and LE edema.  Found to have non Q wave MI, now s/p CABG x 3 10/28/15  Clinical Impression  Patient presents with decreased independence with mobility due to deficits listed in PT problem list.  He will benefit from skilled PT in the acute setting to allow return home with family support and follow up HHPT.  Will be candidate also for outpatient cardiac rehab once competes HHPT.     Follow Up Recommendations Home health PT;Supervision/Assistance - 24 hour    Equipment Recommendations  None recommended by PT    Recommendations for Other Services       Precautions / Restrictions Precautions Precautions: Fall;Sternal      Mobility  Bed Mobility Overal bed mobility: Needs Assistance Bed Mobility: Sit to Sidelying         Sit to sidelying: Mod assist;Max assist General bed mobility comments: assist for legs in bed and to guide trunk to side with pt hugging pillow  Transfers Overall transfer level: Needs assistance Equipment used: Rolling walker (2 wheeled) Transfers: Sit to/from Stand Sit to Stand: Mod assist;Max assist         General transfer comment: lift assist and lower assist due to precautions and LE weakness; cues for technique  Ambulation/Gait Ambulation/Gait assistance: Mod assist Ambulation Distance (Feet): 40 Feet (x 2) Assistive device: Rolling walker (2 wheeled) Gait Pattern/deviations: Step-to pattern;Step-through pattern;Decreased stride length;Shuffle;Wide base of support     General Gait Details: increased heaviness of legs once got  into hallway so rested in desk chair prior to return to bed, assist for walker management and increasing assist for balance as legs getting heavy  Stairs            Wheelchair Mobility    Modified Rankin (Stroke Patients Only)       Balance Overall balance assessment: Needs assistance Sitting-balance support: Feet supported Sitting balance-Leahy Scale: Fair Sitting balance - Comments: seated EOB   Standing balance support: Bilateral upper extremity supported Standing balance-Leahy Scale: Poor Standing balance comment: min A and UE support for balance                             Pertinent Vitals/Pain Pain Assessment: Faces Faces Pain Scale: Hurts little more Pain Location: back Pain Descriptors / Indicators: Aching Pain Intervention(s): Monitored during session;Repositioned    Home Living Family/patient expects to be discharged to:: Private residence Living Arrangements: Spouse/significant other Available Help at Discharge: Family;Available 24 hours/day Type of Home: House Home Access: Stairs to enter Entrance Stairs-Rails: Doctor, general practice of Steps: 4-5 Home Layout: Two level;Able to live on main level with bedroom/bathroom Home Equipment: Shower seat;Walker - 4 wheels Additional Comments: small bathroom    Prior Function Level of Independence: Independent with assistive device(s);Needs assistance   Gait / Transfers Assistance Needed: walker in the morning when groggy and then furniture walks. has had balance trouble for a while, but not recent falls; holds onto family in the community  ADL's / Homemaking Assistance Needed: indep with seat        Hand Dominance   Dominant Hand:  Left    Extremity/Trunk Assessment   Upper Extremity Assessment: Generalized weakness           Lower Extremity Assessment: Generalized weakness         Communication   Communication: No difficulties  Cognition Arousal/Alertness:  Awake/alert Behavior During Therapy: WFL for tasks assessed/performed Overall Cognitive Status: Impaired/Different from baseline Area of Impairment: Following commands;Problem solving       Following Commands: Follows one step commands with increased time     Problem Solving: Slow processing;Requires verbal cues      General Comments      Exercises     Assessment/Plan    PT Assessment Patient needs continued PT services  PT Problem List Decreased balance;Decreased activity tolerance;Decreased strength;Decreased knowledge of use of DME;Decreased knowledge of precautions;Decreased safety awareness;Pain          PT Treatment Interventions DME instruction;Gait training;Therapeutic exercise;Therapeutic activities;Patient/family education;Stair training;Balance training;Functional mobility training    PT Goals (Current goals can be found in the Care Plan section)  Acute Rehab PT Goals Patient Stated Goal: To go home PT Goal Formulation: With patient/family Time For Goal Achievement: 11/06/15 Potential to Achieve Goals: Good    Frequency Min 3X/week   Barriers to discharge        Co-evaluation               End of Session Equipment Utilized During Treatment: Gait belt Activity Tolerance: Patient limited by fatigue Patient left: in bed;with call bell/phone within reach;with family/visitor present Nurse Communication: Mobility status         Time: [id number] PT Time Calculation (min) (ACUTE ONLY): 27 min   Charges:   PT Evaluation $PT Eval Moderate Complexity: 1 Procedure PT Treatments $Gait Training: 8-22 mins   PT G CodesElray Mcgregor:        Kaycee Mcgaugh 10/30/2015, 2:23 PM  Sheran Lawlessyndi Dhairya Corales, PT [phone] 10/30/2015

## 2015-10-31 LAB — RENAL FUNCTION PANEL
Albumin: 1.9 g/dL — ABNORMAL LOW (ref 3.5–5.0)
Anion gap: 5 (ref 5–15)
BUN: 38 mg/dL — ABNORMAL HIGH (ref 6–20)
CHLORIDE: 114 mmol/L — AB (ref 101–111)
CO2: 20 mmol/L — AB (ref 22–32)
CREATININE: 2.08 mg/dL — AB (ref 0.61–1.24)
Calcium: 8.3 mg/dL — ABNORMAL LOW (ref 8.9–10.3)
GFR calc non Af Amer: 33 mL/min — ABNORMAL LOW (ref >60)
GFR, EST AFRICAN AMERICAN: 38 mL/min — AB (ref >60)
Glucose, Bld: 131 mg/dL — ABNORMAL HIGH (ref 65–99)
POTASSIUM: 4.8 mmol/L (ref 3.5–5.1)
Phosphorus: 5.1 mg/dL — ABNORMAL HIGH (ref 2.5–4.6)
Sodium: 139 mmol/L (ref 135–145)

## 2015-10-31 LAB — CBC
HCT: 31.5 % — ABNORMAL LOW (ref 39.0–52.0)
HEMOGLOBIN: 9.7 g/dL — AB (ref 13.0–17.0)
MCH: 27.2 pg (ref 26.0–34.0)
MCHC: 30.8 g/dL (ref 30.0–36.0)
MCV: 88.2 fL (ref 78.0–100.0)
PLATELETS: 139 10*3/uL — AB (ref 150–400)
RBC: 3.57 MIL/uL — ABNORMAL LOW (ref 4.22–5.81)
RDW: 17.6 % — AB (ref 11.5–15.5)
WBC: 9.6 10*3/uL (ref 4.0–10.5)

## 2015-10-31 LAB — TYPE AND SCREEN
ABO/RH(D): A NEG
Antibody Screen: NEGATIVE
UNIT DIVISION: 0
UNIT DIVISION: 0
UNIT DIVISION: 0
Unit division: 0

## 2015-10-31 LAB — GLUCOSE, CAPILLARY
GLUCOSE-CAPILLARY: 110 mg/dL — AB (ref 65–99)
GLUCOSE-CAPILLARY: 168 mg/dL — AB (ref 65–99)
GLUCOSE-CAPILLARY: 160 mg/dL — AB (ref 65–99)
Glucose-Capillary: 120 mg/dL — ABNORMAL HIGH (ref 65–99)
Glucose-Capillary: 175 mg/dL — ABNORMAL HIGH (ref 65–99)

## 2015-10-31 LAB — SODIUM, URINE, RANDOM: Sodium, Ur: 42 mmol/L

## 2015-10-31 LAB — CREATININE, URINE, RANDOM: CREATININE, URINE: 105.4 mg/dL

## 2015-10-31 MED ORDER — CARVEDILOL 3.125 MG PO TABS
3.1250 mg | ORAL_TABLET | Freq: Two times a day (BID) | ORAL | Status: DC
  Administered 2015-10-31 – 2015-11-01 (×2): 3.125 mg via ORAL
  Filled 2015-10-31 (×2): qty 1

## 2015-10-31 MED ORDER — ACETAMINOPHEN 325 MG PO TABS: 650.0000 mg | ORAL_TABLET | Freq: Four times a day (QID) | ORAL | Status: DC | PRN

## 2015-10-31 MED ORDER — FUROSEMIDE 40 MG PO TABS
40.0000 mg | ORAL_TABLET | Freq: Every day | ORAL | Status: AC
  Administered 2015-10-31: 40 mg via ORAL
  Filled 2015-10-31: qty 1

## 2015-10-31 MED ORDER — SODIUM CHLORIDE 0.9% FLUSH
3.0000 mL | Freq: Two times a day (BID) | INTRAVENOUS | Status: DC
  Administered 2015-10-31 – 2015-11-02 (×4): 3 mL via INTRAVENOUS

## 2015-10-31 MED ORDER — FAMOTIDINE 20 MG PO TABS
20.0000 mg | ORAL_TABLET | Freq: Every day | ORAL | Status: DC
  Administered 2015-10-31 – 2015-11-02 (×3): 20 mg via ORAL
  Filled 2015-10-31 (×3): qty 1

## 2015-10-31 MED ORDER — SODIUM CHLORIDE 0.9 % IV SOLN: 250.0000 mL | INTRAVENOUS | Status: DC | PRN

## 2015-10-31 MED ORDER — BISACODYL 10 MG RE SUPP: 10.0000 mg | Freq: Every day | RECTAL | Status: DC | PRN

## 2015-10-31 MED ORDER — TRAMADOL HCL 50 MG PO TABS
50.0000 mg | ORAL_TABLET | Freq: Four times a day (QID) | ORAL | Status: DC | PRN
  Administered 2015-11-01: 50 mg via ORAL
  Filled 2015-10-31: qty 1

## 2015-10-31 MED ORDER — ONDANSETRON HCL 4 MG PO TABS: 4.0000 mg | ORAL_TABLET | Freq: Four times a day (QID) | ORAL | Status: DC | PRN

## 2015-10-31 MED ORDER — INSULIN ASPART 100 UNIT/ML ~~LOC~~ SOLN
0.0000 [IU] | Freq: Three times a day (TID) | SUBCUTANEOUS | Status: DC
Start: 2015-10-31 — End: 2015-11-02
  Administered 2015-10-31: 4 [IU] via SUBCUTANEOUS
  Administered 2015-10-31 – 2015-11-02 (×4): 2 [IU] via SUBCUTANEOUS

## 2015-10-31 MED ORDER — ONDANSETRON HCL 4 MG/2ML IJ SOLN: 4.0000 mg | Freq: Four times a day (QID) | INTRAMUSCULAR | Status: DC | PRN

## 2015-10-31 MED ORDER — MOVING RIGHT ALONG BOOK
Freq: Once | Status: AC
  Administered 2015-10-31: 1
  Filled 2015-10-31: qty 1

## 2015-10-31 MED ORDER — ASPIRIN EC 325 MG PO TBEC
325.0000 mg | DELAYED_RELEASE_TABLET | Freq: Every day | ORAL | Status: DC
Start: 2015-10-31 — End: 2015-11-02
  Administered 2015-11-01 – 2015-11-02 (×2): 325 mg via ORAL
  Filled 2015-10-31 (×2): qty 1

## 2015-10-31 MED ORDER — DOCUSATE SODIUM 100 MG PO CAPS
200.0000 mg | ORAL_CAPSULE | Freq: Every day | ORAL | Status: DC
  Administered 2015-11-01: 200 mg via ORAL
  Filled 2015-10-31 (×2): qty 2

## 2015-10-31 MED ORDER — OXYCODONE HCL 5 MG PO TABS: 5.0000 mg | ORAL_TABLET | ORAL | Status: DC | PRN

## 2015-10-31 MED ORDER — SODIUM CHLORIDE 0.9% FLUSH: 3.0000 mL | INTRAVENOUS | Status: DC | PRN

## 2015-10-31 MED ORDER — BISACODYL 5 MG PO TBEC: 10.0000 mg | DELAYED_RELEASE_TABLET | Freq: Every day | ORAL | Status: DC | PRN

## 2015-10-31 NOTE — Progress Notes (Signed)
CARDIAC REHAB PHASE I   PRE:  Rate/Rhythm: 82 SR  BP:  Sitting: 152/70       SaO2: 98% RA  MODE:  Ambulation: 300 ft   POST:  Rate/Rhythm: 90 SR  BP:  Sitting: 132/69        SaO2: 98% RA  Pt needed two assist to stand, walked 36900ft with rollator and x2 assist.  Pt had to stop to and sit/rest about half way.  Pt appeared to be winded towards the end but denied when asked.  Assisted pt x2 back to bed after walk with call bell with in reach.   [id number]  Steven SpecterAshley L Janielle Mittelstadt, MS 10/31/2015 3:03 PM

## 2015-10-31 NOTE — Progress Notes (Signed)
Subjective: Interval History: has no complaint .  Objective: Vital signs in last 24 hours: Temp:  [98 F (36.7 C)-98.6 F (37 C)] 98.2 F (36.8 C) (09/28 0400) Pulse Rate:  [72-80] 78 (09/28 0700) Resp:  [5-23] 15 (09/28 0700) BP: (95-149)/(51-71) 149/71 (09/28 0700) SpO2:  [88 %-100 %] 100 % (09/28 0700) Weight:  [69.4 kg (153 lb)] 69.4 kg (153 lb) (09/28 0600) Weight change: 0.4 kg (14.1 oz)  Intake/Output from previous day: 09/27 0701 - 09/28 0700 In: 963 [P.O.:480; I.V.:483] Out: 380 [Urine:380] Intake/Output this shift: No intake/output data recorded.  General appearance: alert, cooperative and no distress Resp: diminished breath sounds bilaterally and rales bibasilar Cardio: S1, S2 normal and systolic murmur: holosystolic 2/6, blowing at apex GI: TX RLQ normal, pos bs, soft Extremities: edema 1+  Lab Results:  Recent Labs  10/30/15 0420 10/31/15 0414  WBC 13.6* 9.6  HGB 9.9* 9.7*  HCT 33.3* 31.5*  PLT 142* 139*   BMET:  Recent Labs  10/30/15 0420 10/31/15 0415  NA 140 139  K 4.8 4.8  CL 111 114*  CO2 20* 20*  GLUCOSE 112* 131*  BUN 25* 38*  CREATININE 1.92* 2.08*  CALCIUM 8.4* 8.3*   No results for input(s): PTH in the last 72 hours. Iron Studies: No results for input(s): IRON, TIBC, TRANSFERRIN, FERRITIN in the last 72 hours.  Studies/Results: Dg Chest Port 1 View  Result Date: 10/30/2015 CLINICAL DATA:  Status post coronary artery bypass grafting coronary artery disease EXAM: PORTABLE CHEST 1 VIEW COMPARISON:  October 29, 2015 FINDINGS: Swan-Ganz catheter has been removed. Cordis tip is in the superior vena cava. Left chest tube has been removed. Mediastinal drain has been removed. Temporary pacemaker wires remain attached to the right heart. No pneumothorax evident. There is patchy airspace opacity in the right lower lobe with minimal right pleural effusion. The left lung is clear. Heart is mildly enlarged, stable. The pulmonary vascularity is  normal. No adenopathy. IMPRESSION: No pneumothorax. Cordis tip in superior vena cava. Airspace opacity in the right lower lobe consistent with atelectasis with questionable superimposed pneumonia. Lungs elsewhere clear. Stable cardiac prominence. Electronically Signed   By: Bretta BangWilliam  Woodruff III M.D.   On: 10/30/2015 07:44    I have reviewed the patient's current medications.  Assessment/Plan: 1 Renal TX Cr ^. Bp better, still low urine vol, will give 1 dose Lasix, try with out foley. Cont Pred at higher dose 2 DM controlled 3 CAD/CABG per Dr. Laneta SimmersBartle 4 Anemia stable P Pred, lasix, d/c Foley    LOS: 12 days   Lorell Thibodaux L 10/31/2015,7:28 AM

## 2015-10-31 NOTE — Progress Notes (Signed)
Subjective:  Doing well denies any chest pain or shortness of breath.  Objective:  Vital Signs in the last 24 hours: Temp:  [97.9 F (36.6 C)-98.6 F (37 C)] 97.9 F (36.6 C) (09/28 0737) Pulse Rate:  [72-80] 78 (09/28 0800) Resp:  [5-23] 18 (09/28 0800) BP: (95-154)/(51-71) 154/71 (09/28 0800) SpO2:  [88 %-100 %] 100 % (09/28 0800) Weight:  [153 lb (69.4 kg)] 153 lb (69.4 kg) (09/28 0600)  Intake/Output from previous day: 09/27 0701 - 09/28 0700 In: 963 [P.O.:480; I.V.:483] Out: 380 [Urine:380] Intake/Output from this shift: Total I/O In: -  Out: 70 [Urine:70]  Physical Exam: Neck: no adenopathy, no carotid bruit, no JVD and supple, symmetrical, trachea midline Lungs: Decreased breath sound at bases with faint rales Heart: regular rate and rhythm, S1, S2 normal and Soft systolic murmur noted Abdomen: soft, non-tender; bowel sounds normal; no masses,  no organomegaly Extremities: No clubbing cyanosis trace edema noted  Lab Results:  Recent Labs  10/30/15 0420 10/31/15 0414  WBC 13.6* 9.6  HGB 9.9* 9.7*  PLT 142* 139*    Recent Labs  10/30/15 0420 10/31/15 0415  NA 140 139  K 4.8 4.8  CL 111 114*  CO2 20* 20*  GLUCOSE 112* 131*  BUN 25* 38*  CREATININE 1.92* 2.08*   No results for input(s): TROPONINI in the last 72 hours.  Invalid input(s): CK, MB Hepatic Function Panel  Recent Labs  10/31/15 0415  ALBUMIN 1.9*   No results for input(s): CHOL in the last 72 hours. No results for input(s): PROTIME in the last 72 hours.  Imaging: Imaging results have been reviewed and Dg Chest Port 1 View  Result Date: 10/30/2015 CLINICAL DATA:  Status post coronary artery bypass grafting coronary artery disease EXAM: PORTABLE CHEST 1 VIEW COMPARISON:  October 29, 2015 FINDINGS: Swan-Ganz catheter has been removed. Cordis tip is in the superior vena cava. Left chest tube has been removed. Mediastinal drain has been removed. Temporary pacemaker wires remain attached  to the right heart. No pneumothorax evident. There is patchy airspace opacity in the right lower lobe with minimal right pleural effusion. The left lung is clear. Heart is mildly enlarged, stable. The pulmonary vascularity is normal. No adenopathy. IMPRESSION: No pneumothorax. Cordis tip in superior vena cava. Airspace opacity in the right lower lobe consistent with atelectasis with questionable superimposed pneumonia. Lungs elsewhere clear. Stable cardiac prominence. Electronically Signed   By: Bretta BangWilliam  Woodruff III M.D.   On: 10/30/2015 07:44    Cardiac Studies:  Assessment/Plan:  Status postsmall non-Q-wave myocardial infarction Multivessel CAD status post multivessel PCI in the paststatus post left cath with aggressive in-stent restenosis in ostial LAD and proximal left circumflex and distal left main stenosisstatus post CABG 3postop day 2 doing well  Ischemic cardiomyopathy Hypertension Diabetes mellitus Hyperlipidemia History of end-stage renal disease status post renal transplant Chronic kidney diseaseStage II Anemia of chronic disease Peripheral neuropathy Peripheral vascular disease Depression Postop atelectasis/mild volume overload Plan Continue present management Check labs Encouraged incentive spirometry  LOS: 12 days    Steven CloudHarwani, Steven Riddle 10/31/2015, 9:55 AM

## 2015-10-31 NOTE — Progress Notes (Signed)
3 Days Post-Op Procedure(s) (LRB): CORONARY ARTERY BYPASS GRAFTING (CABG)  X  3   UTILIZING THE LEFT INTERNAL MAMMARY ARTERY AND ENDOSCOPICALLY HARVESTED RIGHT SAPHENEOUS VEINS. (N/A) TRANSESOPHAGEAL ECHOCARDIOGRAM (TEE) (N/A) Subjective:  No complaints. Walked a little yesterday  Objective: Vital signs in last 24 hours: Temp:  [98 F (36.7 C)-98.6 F (37 C)] 98.2 F (36.8 C) (09/28 0400) Pulse Rate:  [72-80] 78 (09/28 0700) Cardiac Rhythm: Normal sinus rhythm (09/28 0500) Resp:  [5-23] 15 (09/28 0700) BP: (95-149)/(51-71) 149/71 (09/28 0700) SpO2:  [88 %-100 %] 100 % (09/28 0700) Weight:  [69.4 kg (153 lb)] 69.4 kg (153 lb) (09/28 0600)  Hemodynamic parameters for last 24 hours:    Intake/Output from previous day: 09/27 0701 - 09/28 0700 In: 963 [P.O.:480; I.V.:483] Out: 380 [Urine:380] Intake/Output this shift: No intake/output data recorded.  General appearance: alert and cooperative Neurologic: intact Heart: regular rate and rhythm, S1, S2 normal, no murmur, click, rub or gallop Lungs: diminished breath sounds bibasilar Extremities: edema mild Wound: dressing dry  Lab Results:  Recent Labs  10/30/15 0420 10/31/15 0414  WBC 13.6* 9.6  HGB 9.9* 9.7*  HCT 33.3* 31.5*  PLT 142* 139*   BMET:  Recent Labs  10/30/15 0420 10/31/15 0415  NA 140 139  K 4.8 4.8  CL 111 114*  CO2 20* 20*  GLUCOSE 112* 131*  BUN 25* 38*  CREATININE 1.92* 2.08*  CALCIUM 8.4* 8.3*    PT/INR:  Recent Labs  10/28/15 1232  LABPROT 18.8*  INR 1.56   ABG    Component Value Date/Time   PHART 7.346 (L) 10/29/2015 0436   HCO3 18.6 (L) 10/29/2015 0436   TCO2 20 10/29/2015 1630   ACIDBASEDEF 7.0 (H) 10/29/2015 0436   O2SAT 93.0 10/29/2015 0436   CBG (last 3)   Recent Labs  10/30/15 1934 10/30/15 2357 10/31/15 0402  GLUCAP 103* 117* 120*    Assessment/Plan: S/P Procedure(s) (LRB): CORONARY ARTERY BYPASS GRAFTING (CABG)  X  3   UTILIZING THE LEFT INTERNAL MAMMARY  ARTERY AND ENDOSCOPICALLY HARVESTED RIGHT SAPHENEOUS VEINS. (N/A) TRANSESOPHAGEAL ECHOCARDIOGRAM (TEE) (N/A)  He is hemodynamically stable in sinus rhythm. BP is back up again. Will resume low dose Coreg  Creatinine up slightly from yesterday. Urine output adequate. Expect renal function to gradually return to baseline. Nephrology following.  Volume excess: weight is 14 lbs over preop if accurate. Holding off on diuresis for now until ok with nephrology. He has mild edema.  DM: glucose under adequate control  Deconditioning: continue PT  Encourage IS, ambulation  Will remove sleeve and transfer to 2W.   LOS: 12 days    Alleen BorneBryan K Navy Rothschild 10/31/2015

## 2015-10-31 NOTE — Progress Notes (Signed)
Physical Therapy Treatment Patient Details Name: Steven Riddle MRN: [id number] DOB: 10-14-1955 Today's Date: 10/31/2015    History of Present Illness The patient is a 60 year old [place] gentleman with a history of poorly controlled DM with peripheral neuropathy and ESRD, hypertension, s/p renal transplant as well as severe multi-vessel coronary artery disease s/p PCI/stenting, was admitted on 10/18/15 with SOB, cough and LE edema.  Found to have non Q wave MI, now s/p CABG x 3 10/28/15    PT Comments    Pt making good progress.  Follow Up Recommendations  Home health PT;Supervision/Assistance - 24 hour     Equipment Recommendations  None recommended by PT    Recommendations for Other Services       Precautions / Restrictions Precautions Precautions: Fall;Sternal Restrictions Weight Bearing Restrictions: Yes (sternal precautions) RLE Weight Bearing: Weight bearing as tolerated    Mobility  Bed Mobility               General bed mobility comments: Pt up in chair  Transfers Overall transfer level: Needs assistance Equipment used: Rolling walker (2 wheeled) Transfers: Sit to/from Stand Sit to Stand: Mod assist;+2 physical assistance         General transfer comment: Assist to bring hips and trunk up.  Ambulation/Gait Ambulation/Gait assistance: Min assist;+2 safety/equipment Ambulation Distance (Feet): 100 Feet (x 2) Assistive device: Rolling walker (2 wheeled) Gait Pattern/deviations: Step-through pattern;Decreased step length - left;Decreased step length - right;Wide base of support Gait velocity: decr Gait velocity interpretation: Below normal speed for age/gender General Gait Details: Assist for balance and support. As distance incr legs became very wobbly and shaky.   Stairs            Wheelchair Mobility    Modified Rankin (Stroke Patients Only)       Balance Overall balance assessment: Needs assistance Sitting-balance support: No  upper extremity supported;Feet supported Sitting balance-Leahy Scale: Fair     Standing balance support: Bilateral upper extremity supported Standing balance-Leahy Scale: Poor Standing balance comment: walker and min A for static standing                    Cognition Arousal/Alertness: Awake/alert Behavior During Therapy: WFL for tasks assessed/performed Overall Cognitive Status: Impaired/Different from baseline Area of Impairment: Following commands;Problem solving       Following Commands: Follows one step commands with increased time     Problem Solving: Slow processing;Requires verbal cues      Exercises      General Comments        Pertinent Vitals/Pain Pain Assessment: Faces Faces Pain Scale: Hurts a little bit Pain Location: incision Pain Descriptors / Indicators: Grimacing Pain Intervention(s): Limited activity within patient's tolerance;Monitored during session    Home Living                      Prior Function            PT Goals (current goals can now be found in the care plan section) Progress towards PT goals: Progressing toward goals    Frequency    Min 3X/week      PT Plan Current plan remains appropriate    Co-evaluation             End of Session Equipment Utilized During Treatment: Gait belt Activity Tolerance: Patient limited by fatigue Patient left: with call bell/phone within reach;in chair     Time: [id number] PT Time Calculation (min) (ACUTE ONLY):  19 min  Charges:  $Gait Training: 8-22 mins                    G Codes:      Steven Riddle 11-15-2015, 10:11 AM [facility] PT [phone]

## 2015-11-01 LAB — CBC
HEMATOCRIT: 33.3 % — AB (ref 39.0–52.0)
Hemoglobin: 10.4 g/dL — ABNORMAL LOW (ref 13.0–17.0)
MCH: 27.1 pg (ref 26.0–34.0)
MCHC: 31.2 g/dL (ref 30.0–36.0)
MCV: 86.7 fL (ref 78.0–100.0)
Platelets: 161 10*3/uL (ref 150–400)
RBC: 3.84 MIL/uL — ABNORMAL LOW (ref 4.22–5.81)
RDW: 17.5 % — AB (ref 11.5–15.5)
WBC: 8.2 10*3/uL (ref 4.0–10.5)

## 2015-11-01 LAB — GLUCOSE, CAPILLARY
GLUCOSE-CAPILLARY: 83 mg/dL (ref 65–99)
GLUCOSE-CAPILLARY: 110 mg/dL — AB (ref 65–99)
GLUCOSE-CAPILLARY: 133 mg/dL — AB (ref 65–99)
Glucose-Capillary: 126 mg/dL — ABNORMAL HIGH (ref 65–99)

## 2015-11-01 LAB — RENAL FUNCTION PANEL
Albumin: 2 g/dL — ABNORMAL LOW (ref 3.5–5.0)
Anion gap: 4 — ABNORMAL LOW (ref 5–15)
BUN: 43 mg/dL — AB (ref 6–20)
CHLORIDE: 114 mmol/L — AB (ref 101–111)
CO2: 22 mmol/L (ref 22–32)
Calcium: 8.6 mg/dL — ABNORMAL LOW (ref 8.9–10.3)
Creatinine, Ser: 1.94 mg/dL — ABNORMAL HIGH (ref 0.61–1.24)
GFR calc Af Amer: 41 mL/min — ABNORMAL LOW (ref >60)
GFR, EST NON AFRICAN AMERICAN: 36 mL/min — AB (ref >60)
Glucose, Bld: 115 mg/dL — ABNORMAL HIGH (ref 65–99)
POTASSIUM: 4.4 mmol/L (ref 3.5–5.1)
Phosphorus: 4 mg/dL (ref 2.5–4.6)
Sodium: 140 mmol/L (ref 135–145)

## 2015-11-01 LAB — CYCLOSPORINE: Cyclosporine, LabCorp: 259 ng/mL (ref 100–400)

## 2015-11-01 MED ORDER — AMLODIPINE BESYLATE 5 MG PO TABS
5.0000 mg | ORAL_TABLET | Freq: Every day | ORAL | Status: DC
  Administered 2015-11-01: 5 mg via ORAL
  Filled 2015-11-01: qty 1

## 2015-11-01 MED ORDER — FUROSEMIDE 40 MG PO TABS
40.0000 mg | ORAL_TABLET | Freq: Every day | ORAL | Status: DC
  Administered 2015-11-01 – 2015-11-02 (×2): 40 mg via ORAL
  Filled 2015-11-01 (×2): qty 1

## 2015-11-01 MED ORDER — CARVEDILOL 6.25 MG PO TABS
6.2500 mg | ORAL_TABLET | Freq: Two times a day (BID) | ORAL | Status: DC
  Administered 2015-11-01 – 2015-11-02 (×2): 6.25 mg via ORAL
  Filled 2015-11-01 (×2): qty 1

## 2015-11-01 NOTE — Discharge Summary (Signed)
Physician Discharge Summary  Patient ID: Steven Riddle MRN: [id number] DOB/AGE: [birth year] 62 y.o.  Admit date: 10/18/2015 Discharge date: 11/02/2015  Admission Diagnoses:  Patient Active Problem List   Diagnosis Date Noted  . NSTEMI, initial episode of care (HCC) 10/19/2015  . Acute systolic (congestive) heart failure (HCC) 10/19/2015  . Diabetes (HCC) 04/03/2015  . Numbness 04/01/2015  . Acute non-Q-wave MI <8 weeks prior, subsequent admission after initial 09/06/2014  . Acute respiratory failure with hypoxia (HCC)   . Chronic systolic heart failure (HCC)   . Renal transplant, status post   . Hypomagnesemia   . Non Q wave myocardial infarction (HCC)   . Chronic systolic congestive heart failure (HCC)   . History of renal transplant   . Acute non Q wave MI (myocardial infarction), initial episode of care (HCC)   . Acute on chronic respiratory failure with hypoxia (HCC)   . Renal transplant recipient   . Immunosuppressed status (HCC)   . Hypokalemia   . Essential hypertension   . Elevated troponin   . Protein-calorie malnutrition, severe (HCC) 06/02/2014  . Acute respiratory failure with hypoxemia (HCC) 05/30/2014  . Acute pulmonary edema (HCC) 05/30/2014  . Sepsis due to pneumonia (HCC) 05/29/2014  . CAP (community acquired pneumonia) 05/29/2014  . CHF (congestive heart failure) (HCC) 05/29/2014  . Hypoxia 05/29/2014  . NSTEMI (non-ST elevated myocardial infarction) (HCC) 05/29/2014  . PAD (peripheral artery disease) (HCC) 11/20/2013  . Routine general medical examination at a health care facility 04/06/2013  . Screening for prostate cancer 01/06/2012  . Cardiomyopathy 06/05/2010  . BPH (benign prostatic hyperplasia) 06/05/2010  . Fatty liver 06/05/2010  . Gallbladder sludge 06/05/2010  . HYPERKALEMIA 04/09/2009  . 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 RENAL INSUFFICIENCY, CHRONIC 06/07/2008  . PROTEINURIA 06/07/2008  . PALPITATIONS 03/19/2008  . LEG PAIN,  RIGHT 11/17/2007  . ANXIETY 05/23/2007  . ANEMIA 02/15/2007  . HYPERLIPIDEMIA 01/10/2007  . DEPRESSION 01/10/2007  . PERIPHERAL NEUROPATHY 01/10/2007  . HYPERTENSION 01/10/2007  . ALLERGIC RHINITIS 01/10/2007   Discharge Diagnoses:    Patient Active Problem List   Diagnosis Date Noted  . NSTEMI, initial episode of care (HCC) 10/19/2015  . Acute systolic (congestive) heart failure (HCC) 10/19/2015  . Diabetes (HCC) 04/03/2015  . Numbness 04/01/2015  . Acute non-Q-wave MI <8 weeks prior, subsequent admission after initial 09/06/2014  . Acute respiratory failure with hypoxia (HCC)   . Chronic systolic heart failure (HCC)   . Renal transplant, status post   . Hypomagnesemia   . Non Q wave myocardial infarction (HCC)   . Chronic systolic congestive heart failure (HCC)   . History of renal transplant   . Acute non Q wave MI (myocardial infarction), initial episode of care (HCC)   . Acute on chronic respiratory failure with hypoxia (HCC)   . Renal transplant recipient   . Immunosuppressed status (HCC)   . Hypokalemia   . Essential hypertension   . Elevated troponin   . Protein-calorie malnutrition, severe (HCC) 06/02/2014  . Acute respiratory failure with hypoxemia (HCC) 05/30/2014  . Acute pulmonary edema (HCC) 05/30/2014  . Sepsis due to pneumonia (HCC) 05/29/2014  . CAP (community acquired pneumonia) 05/29/2014  . CHF (congestive heart failure) (HCC) 05/29/2014  . Hypoxia 05/29/2014  . NSTEMI (non-ST elevated myocardial infarction) (HCC) 05/29/2014  . PAD (peripheral artery disease) (HCC) 11/20/2013  . Routine general medical examination at a health care facility 04/06/2013  . Screening for prostate cancer 01/06/2012  . Cardiomyopathy 06/05/2010  . BPH (  benign prostatic hyperplasia) 06/05/2010  . Fatty liver 06/05/2010  . Gallbladder sludge 06/05/2010  . HYPERKALEMIA 04/09/2009  . 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 RENAL INSUFFICIENCY, CHRONIC 06/07/2008  .  PROTEINURIA 06/07/2008  . PALPITATIONS 03/19/2008  . LEG PAIN, RIGHT 11/17/2007  . ANXIETY 05/23/2007  . ANEMIA 02/15/2007  . HYPERLIPIDEMIA 01/10/2007  . DEPRESSION 01/10/2007  . PERIPHERAL NEUROPATHY 01/10/2007  . HYPERTENSION 01/10/2007  . ALLERGIC RHINITIS 01/10/2007   Discharged Condition: good  History of Present Illness:  [name] is a 60 yo [place] gentleman with a history of poorly controlled DM with peripheral neuropathy and ESRD, hypertension, s/p renal transplant as well as severe multi-vessel coronary artery disease s/p PCI/stenting of the LCX  on 06/04/2014, stenting of the LAD/D on 06/07/2014, stenting of the RCA on 09/06/2014. He reports a two week history of progressive shortness of breath, cough and leg edema. He denies any chest pain or pressure. ECG on presentation showed NSR with old AS MI and RBBB. Troponin was minimally elevated.  Cardiac catheterization was performed and showed multivessel CAD.  It was felt coronary bypass grafting would be indicated and TCTS consult was obtained.  He was evaluated by Dr. Laneta SimmersBartle who was in agreement the patient would benefit from coronary bypass grafting procedure.  The risks and benefits of the procedure were explained to the patient and he was agreeable to proceed.      Hospital Course:   The patient remained chest pain free during hospitalization.  He was followed closely by nephrology since his renal transplant.  He was taken to the operating room on 10/28/2015 and underwent CABG x 3 utilizing LIMA to LAD, SVG to Diagonal, and SVG to OM2.  He also underwent endoscopic harvest of greater saphenous vein from his right thigh.  He tolerated the procedure without difficulty and was taken to the SICU in stable condition.  He was extubated the evening of surgery. During his stay in the SICU the patient was weaned off Dopamine and Milrinone as tolerated as tolerated.  He was followed by Nephrology closely during his hospitalization.  His chest tubes  and arterial lines were removed without difficulty.  He continued to maintain NSR.  He developed mild hypertension and was started on low dose Coreg.  He was ambulating with minimal difficulty and was transferred to the telemetry unit in stable condition.  The patient continues to make progress.  He is becoming more hypertensive and his medication was adjusted.  He continues to maintain NSR and his pacing wires were removed without difficulty.  He has some dizziness with standing which was present prior to surgery.  PT evaluation was obtained and they felt home health was appropriate.  He continues to ambulate with assistance.  He is tolerating a regular diet.  His renal function is stable.  He is felt medically stable for discharge home today.              Consults: nephrology  Significant Diagnostic Studies: angiography:   Prox RCA to Dist RCA lesion, 30 %stenosed.  Ost 1st Mrg to 1st Mrg lesion, 0 %stenosed.  Ost Cx to Prox Cx lesion, 95 %stenosed.  Ost LM lesion, 70 %stenosed.  Ost 1st Diag to 1st Diag lesion, 0 %stenosed.  Ost LAD to Mid LAD lesion, 0 %stenosed.  Ost LAD lesion, 90 %stenosed.   Treatments: surgery:   1. Median Sternotomy 2. Extracorporeal circulation 3.   Coronary artery bypass grafting x 3   Left internal mammary graft to the  LAD  SVG to diagonal  SVG to OM  4.   Endoscopic vein harvest from the right leg   Disposition: 01-Home or Self Care   Discharge medications:  The patient has been discharged on:   1.Beta Blocker:  Yes [ x  ]                              No   [   ]                              If No, reason:  2.Ace Inhibitor/ARB: Yes [   ]                                     No  [ x   ]                                     If No, reason: S/P Renal Transplant, elevated creatinine  3.Statin:   Yes [ x  ]                  No  [   ]                  If No, reason:  4.Ecasa:  Yes  [x   ]                  No   [   ]                  If  No, reason:     Discharge Instructions    Amb Referral to Cardiac Rehabilitation    Complete by:  As directed    Diagnosis:  CABG   CABG X ___:  3       Medication List    STOP taking these medications   fluconazole 150 MG tablet Commonly known as:  DIFLUCAN   prasugrel 10 MG Tabs tablet Commonly known as:  EFFIENT     TAKE these medications   aspirin 325 MG EC tablet Take 1 tablet (325 mg total) by mouth daily. What changed:  medication strength  how much to take   BD ULTRA-FINE PEN NEEDLES 29G X 12.7MM Misc Generic drug:  Insulin Pen Needle USE AS DIRECTED   brimonidine 0.2 % ophthalmic solution Commonly known as:  ALPHAGAN Place 1 drop into the left eye at bedtime.   carvedilol 6.25 MG tablet Commonly known as:  COREG Take 6.25 mg by mouth daily.   cycloSPORINE 25 MG capsule Commonly known as:  SANDIMMUNE Take 3 capsules (75 mg total) by mouth 2 (two) times daily. What changed:  medication strength  how much to take   dorzolamide-timolol 22.3-6.8 MG/ML ophthalmic solution Commonly known as:  COSOPT Apply 1 drop to eye at bedtime.   furosemide 40 MG tablet Commonly known as:  LASIX Take 1 tablet (40 mg total) by mouth daily. Start taking on:  11/03/2015   insulin detemir 100 UNIT/ML injection Commonly known as:  LEVEMIR Inject 0.1 mLs (10 Units total) into the skin 2 (two) times daily. And pen needles 1/day What changed:  how much to take   latanoprost 0.005 % ophthalmic solution  Commonly known as:  XALATAN Place 1 drop into the left eye at bedtime.   multivitamin with minerals Tabs tablet Take 1 tablet by mouth daily. CENTRUM MULTIVITAMIN FOR 50+   mycophenolate 180 MG EC tablet Commonly known as:  MYFORTIC Take 180 mg by mouth every evening.   oxyCODONE 5 MG immediate release tablet Commonly known as:  Oxy IR/ROXICODONE Take 1-2 tablets (5-10 mg total) by mouth every 6 (six) hours as needed for severe pain.   predniSONE 10 MG  tablet Commonly known as:  DELTASONE Take 1 tablet (10 mg total) by mouth daily. Start taking on:  11/03/2015 What changed:  medication strength  how much to take   rosuvastatin 10 MG tablet Commonly known as:  CRESTOR Take 10 mg by mouth daily. What changed:  Another medication with the same name was removed. Continue taking this medication, and follow the directions you see here.      Follow-up Information    Alleen Borne, MD Follow up on 12/04/2015.   Specialty:  Cardiothoracic Surgery Why:  Appointment is at 3:00, please get CXR at 2:30 at [facility] imaging located on the first floor of our office building Contact information: [address] [place] [address] [phone]        [name], MD .   Specialty:  Cardiology Why:  Please contact office at discharge to set up follow up appointment Contact information: [address] [place] [address] [phone]           Signed: Rowe Clack 11/02/2015, 10:27 AM

## 2015-11-01 NOTE — Progress Notes (Signed)
CARDIAC REHAB PHASE I   PRE:  Rate/Rhythm: 77 SR    BP: sitting 137/58    SaO2: 92 RA  MODE:  Ambulation: 380 ft   POST:  Rate/Rhythm: 83 sr    BP: sitting 118/50     SaO2: 98 RA  Pt to BR then rested on rollator at sink. Ambulated 380 ft with x2 rest stops sitting on rollator. Instructions given to safely sit, propping up on wall. Pt sometimes struggles to process correct sequence but doing better. Tired toward end, esp legs. Ed completed with pt and family. Will send referral to [facility]. Discussed his A1C, encouraged him to seek out endocrinologist.  [phone]   Steven Massonandi Steven Riddle CES, ACSM 11/01/2015 3:40 PM

## 2015-11-01 NOTE — Progress Notes (Addendum)
      [address]       [place] [id number]             [phone]      4 Days Post-Op Procedure(s) (LRB): CORONARY ARTERY BYPASS GRAFTING (CABG)  X  3   UTILIZING THE LEFT INTERNAL MAMMARY ARTERY AND ENDOSCOPICALLY HARVESTED RIGHT SAPHENEOUS VEINS. (N/A) TRANSESOPHAGEAL ECHOCARDIOGRAM (TEE) (N/A)   Subjective:  [name] is doing well.  His wife states he has dizziness with standing and she feels he needs a few more days before going home.  He is ambulating without much difficulty.  +BM  Objective: Vital signs in last 24 hours: Temp:  [97.7 F (36.5 C)-98.4 F (36.9 C)] 98 F (36.7 C) (09/29 0530) Pulse Rate:  [79-85] 85 (09/29 0751) Cardiac Rhythm: Normal sinus rhythm;Bundle branch block (09/29 0741) Resp:  [17-23] 18 (09/29 0530) BP: (139-156)/(66-71) 152/66 (09/29 0530) SpO2:  [96 %-98 %] 98 % (09/29 0530)   Intake/Output from previous day: 09/28 0701 - 09/29 0700 In: 240 [P.O.:240] Out: 70 [Urine:70]  General appearance: alert, cooperative and no distress Heart: regular rate and rhythm Lungs: clear to auscultation bilaterally Abdomen: soft, non-tender; bowel sounds normal; no masses,  no organomegaly Extremities: edema 1+ piting Wound: clean and dry  Lab Results:  Recent Labs  10/31/15 0414 11/01/15 0215  WBC 9.6 8.2  HGB 9.7* 10.4*  HCT 31.5* 33.3*  PLT 139* 161   BMET:  Recent Labs  10/31/15 0415 11/01/15 0215  NA 139 140  K 4.8 4.4  CL 114* 114*  CO2 20* 22  GLUCOSE 131* 115*  BUN 38* 43*  CREATININE 2.08* 1.94*  CALCIUM 8.3* 8.6*    PT/INR: No results for input(s): LABPROT, INR in the last 72 hours. ABG    Component Value Date/Time   PHART 7.346 (L) 10/29/2015 0436   HCO3 18.6 (L) 10/29/2015 0436   TCO2 20 10/29/2015 1630   ACIDBASEDEF 7.0 (H) 10/29/2015 0436   O2SAT 93.0 10/29/2015 0436   CBG (last 3)   Recent Labs  10/31/15 1626 10/31/15 2123 11/01/15 0633  GLUCAP 160* 175* 83    Assessment/Plan: S/P  Procedure(s) (LRB): CORONARY ARTERY BYPASS GRAFTING (CABG)  X  3   UTILIZING THE LEFT INTERNAL MAMMARY ARTERY AND ENDOSCOPICALLY HARVESTED RIGHT SAPHENEOUS VEINS. (N/A) TRANSESOPHAGEAL ECHOCARDIOGRAM (TEE) (N/A)  1. CV- NSR, + Hypertension- will increase Coreg, add Norvasc- will not get BP too low with renal problems, but with SBP at 160 needs some intervention 2. Pulm- no acute issues, continue IS 3. Renal- creatinine at 1.94, remains hypervolemia- continue lasix, appreciate nephrology recs with H/O renal transplant 4. DM- sugars are controlled, continue current regimen 5. Dispo- patient stable, will adjust BP meds, but not allow to get too low, d/c EPW today...likely d/c home over the weekend    LOS: 13 days    Riddle, Steven 11/01/2015   Chart reviewed, patient examined, agree with above. He is walking well. Renal function slightly better today. On lasix 40 mg daily but wt up 1 lb. Will recheck renal panel in am. Will send home when ok with nephrology.

## 2015-11-01 NOTE — Progress Notes (Signed)
Subjective: Interval History: has no complaint . Wants to go home.  Objective: Vital signs in last 24 hours: Temp:  [97.7 F (36.5 C)-98.4 F (36.9 C)] 98 F (36.7 C) (09/29 0530) Pulse Rate:  [78-84] 79 (09/29 0530) Resp:  [17-23] 18 (09/29 0530) BP: (139-156)/(66-71) 152/66 (09/29 0530) SpO2:  [96 %-100 %] 98 % (09/29 0530) Weight change:   Intake/Output from previous day: 09/28 0701 - 09/29 0700 In: 240 [P.O.:240] Out: 70 [Urine:70] Intake/Output this shift: No intake/output data recorded.  General appearance: alert, cooperative and pale Resp: diminished breath sounds bilaterally and rales bibasilar Cardio: S1, S2 normal and systolic murmur: systolic ejection 2/6, blowing at apex GI: TX RLQ, pos bs, liver down 4 cm Extremities: edema 2-3+  Lab Results:  Recent Labs  10/31/15 0414 11/01/15 0215  WBC 9.6 8.2  HGB 9.7* 10.4*  HCT 31.5* 33.3*  PLT 139* 161   BMET:  Recent Labs  10/31/15 0415 11/01/15 0215  NA 139 140  K 4.8 4.4  CL 114* 114*  CO2 20* 22  GLUCOSE 131* 115*  BUN 38* 43*  CREATININE 2.08* 1.94*  CALCIUM 8.3* 8.6*   No results for input(s): PTH in the last 72 hours. Iron Studies: No results for input(s): IRON, TIBC, TRANSFERRIN, FERRITIN in the last 72 hours.  Studies/Results: No results found.  I have reviewed the patient's current medications.  Assessment/Plan: 1 Renal TX Cr a little better,hopefully is trend,will cont higher steroids 1 more day.  Would like to know urine output, will cont Lasix. CyA high, changing Metab, will recheck and may need furthur dose reduction 2 anemia stable 3 Dm controlled 4 CAD/CABG [facility] check CyA, cont Lasix,follow Cr,exam    LOS: 13 days   Jaegar Croft L 11/01/2015,7:50 AM

## 2015-11-01 NOTE — Progress Notes (Signed)
Subjective:  Doing well.  Denies any chest pain or shortness of breath.  Objective:  Vital Signs in the last 24 hours: Temp:  [97.7 F (36.5 C)-98.4 F (36.9 C)] 98 F (36.7 C) (09/29 0530) Pulse Rate:  [79-87] 87 (09/29 0933) Resp:  [17-23] 18 (09/29 0530) BP: (139-156)/(66-71) 152/66 (09/29 0530) SpO2:  [96 %-98 %] 98 % (09/29 0530)  Intake/Output from previous day: 09/28 0701 - 09/29 0700 In: 240 [P.O.:240] Out: 70 [Urine:70] Intake/Output from this shift: No intake/output data recorded.  Physical Exam: Neck: no adenopathy, no carotid bruit, no JVD and supple, symmetrical, trachea midline Lungs: clear to auscultation bilaterally Heart: regular rate and rhythm, S1, S2 normal and soft systolic murmur noted Abdomen: soft, non-tender; bowel sounds normal; no masses,  no organomegaly Extremities: no clubbing, cyanosis, 1+ edema noted  Lab Results:  Recent Labs  10/31/15 0414 11/01/15 0215  WBC 9.6 8.2  HGB 9.7* 10.4*  PLT 139* 161    Recent Labs  10/31/15 0415 11/01/15 0215  NA 139 140  K 4.8 4.4  CL 114* 114*  CO2 20* 22  GLUCOSE 131* 115*  BUN 38* 43*  CREATININE 2.08* 1.94*   No results for input(s): TROPONINI in the last 72 hours.  Invalid input(s): CK, MB Hepatic Function Panel  Recent Labs  11/01/15 0215  ALBUMIN 2.0*   No results for input(s): CHOL in the last 72 hours. No results for input(s): PROTIME in the last 72 hours.  Imaging: Imaging results have been reviewed and No results found.  Cardiac Studies:  Assessment/Plan:  Status postsmall non-Q-wave myocardial infarction Multivessel CAD status post multivessel PCI in the paststatus post left cath with aggressive in-stent restenosis in ostial LAD and proximal left circumflex and distal left main stenosisstatus post CABG 3postop day 4doing well  Ischemic cardiomyopathy Hypertension Diabetes mellitus Hyperlipidemia History of end-stage renal disease status post renal  transplant Chronic kidney diseaseStage II Anemia of chronic disease Peripheral neuropathy Peripheral vascular disease Depression Postop atelectasis/mild volume overload Plan Continue present management. Increase ambulation. Home soon. Dr. Tora DuckKadakiaon call for the weekend   LOS: 13 days    [name], Sholanda Croson 11/01/2015, 9:45 AM

## 2015-11-01 NOTE — Progress Notes (Signed)
EPW removed per protocol. 1 hr bedrest completed at 1500. Pt VSS.   Leonidas Rombergaitlin S Bumbledare, RN

## 2015-11-01 NOTE — Care Management Note (Signed)
Case Management Note Donn PieriniKristi Kayshawn Ozburn RN, BSN Unit 2W-Case Manager [phone]  Patient Details  Name: Steven Riddle MRN: [id number] Date of Birth: 1955/08/19  Subjective/Objective:    Pt admitted with NSTEMI- s/p CABGx3 on 9/25-tx from ICU to 2W on 9/28                Action/Plan: PTA pt lived at home with wife- per PT eval recommendations for HHPT- please place HHPT order if agree with F2F- CM to follow  Expected Discharge Date:                  Expected Discharge Plan:  Home w Home Health Services  In-House Referral:     Discharge planning Services  CM Consult  Post Acute Care Choice:    Choice offered to:     DME Arranged:    DME Agency:     HH Arranged:    HH Agency:     Status of Service:  In process, will continue to follow  If discussed at Long Length of Stay Meetings, dates discussed:    Additional Comments:  Darrold SpanWebster, Dominic Mahaney Hall, RN 11/01/2015, 3:30 PM

## 2015-11-01 NOTE — Discharge Instructions (Signed)
Coronary Artery Bypass Grafting, Care After °Refer to this sheet in the next few weeks. These instructions provide you with information on caring for yourself after your procedure. Your health care provider may also give you more specific instructions. Your treatment has been planned according to current medical practices, but problems sometimes occur. Call your health care provider if you have any problems or questions after your procedure. °WHAT TO EXPECT AFTER THE PROCEDURE °Recovery from surgery will be different for everyone. Some people feel well after 3 or 4 weeks, while for others it takes longer. After your procedure, it is typical to have the following: °· Nausea and a lack of appetite.   °· Constipation. °· Weakness and fatigue.   °· Depression or irritability.   °· Pain or discomfort at your incision site. °HOME CARE INSTRUCTIONS °· Take medicines only as directed by your health care provider. Do not stop taking medicines or start any new medicines without first checking with your health care provider. °· Take your pulse as directed by your health care provider. °· Perform deep breathing as directed by your health care provider. If you were given a device called an incentive spirometer, use it to practice deep breathing several times a day. Support your chest with a pillow or your arms when you take deep breaths or cough. °· Keep incision areas clean, dry, and protected. Remove or change any bandages (dressings) only as directed by your health care provider. You may have skin adhesive strips over the incision areas. Do not take the strips off. They will fall off on their own. °· Check incision areas daily for any swelling, redness, or drainage. °· If incisions were made in your legs, do the following: °¨ Avoid crossing your legs.   °¨ Avoid sitting for long periods of time. Change positions every 30 minutes.   °¨ Elevate your legs when you are sitting. °· Wear compression stockings as directed by your  health care provider. These stockings help keep blood clots from forming in your legs. °· Take showers once your health care provider approves. Until then, only take sponge baths. Pat incisions dry. Do not rub incisions with a washcloth or towel. Do not take baths, swim, or use a hot tub until your health care provider approves. °· Eat foods that are high in fiber, such as raw fruits and vegetables, whole grains, beans, and nuts. Meats should be lean cut. Avoid canned, processed, and fried foods. °· Drink enough fluid to keep your urine clear or pale yellow. °· Weigh yourself every day. This helps identify if you are retaining fluid that may make your heart and lungs work harder. °· Rest and limit activity as directed by your health care provider. You may be instructed to: °¨ Stop any activity at once if you have chest pain, shortness of breath, irregular heartbeats, or dizziness. Get help right away if you have any of these symptoms. °¨ Move around frequently for short periods or take short walks as directed by your health care provider. Increase your activities gradually. You may need physical therapy or cardiac rehabilitation to help strengthen your muscles and build your endurance. °¨ Avoid lifting, pushing, or pulling anything heavier than 10 lb (4.5 kg) for at least 6 weeks after surgery. °· Do not drive until your health care provider approves.  °· Ask your health care provider when you may return to work. °· Ask your health care provider when you may resume sexual activity. °· Keep all follow-up visits as directed by your health care   provider. This is important. °SEEK MEDICAL CARE IF: °· You have swelling, redness, increasing pain, or drainage at the site of an incision. °· You have a fever. °· You have swelling in your ankles or legs. °· You have pain in your legs.   °· You gain 2 or more pounds (0.9 kg) a day. °· You are nauseous or vomit. °· You have diarrhea.  °SEEK IMMEDIATE MEDICAL CARE IF: °· You have  chest pain that goes to your jaw or arms. °· You have shortness of breath.   °· You have a fast or irregular heartbeat.   °· You notice a "clicking" in your breastbone (sternum) when you move.   °· You have numbness or weakness in your arms or legs. °· You feel dizzy or light-headed.   °MAKE SURE YOU: °· Understand these instructions. °· Will watch your condition. °· Will get help right away if you are not doing well or get worse. °  °This information is not intended to replace advice given to you by your health care provider. Make sure you discuss any questions you have with your health care provider. °  °Document Released: 08/08/2004 Document Revised: 02/09/2014 Document Reviewed: 06/28/2012 °Elsevier Interactive Patient Education ©2016 [employer] ° °Endoscopic Saphenous Vein Harvesting, Care After °Refer to this sheet in the next few weeks. These instructions provide you with information on caring for yourself after your procedure. Your health care provider may also give you more specific instructions. Your treatment has been planned according to current medical practices, but problems sometimes occur. Call your health care provider if you have any problems or questions after your procedure. °HOME CARE INSTRUCTIONS °Medicine °· Take whatever pain medicine your surgeon prescribes. Follow the directions carefully. Do not take over-the-counter pain medicine unless your surgeon says it is okay. Some pain medicine can cause bleeding problems for several weeks after surgery. °· Follow your surgeon's instructions about driving. You will probably not be permitted to drive after heart surgery. °· Take any medicines your surgeon prescribes. Any medicines you took before your heart surgery should be checked with your health care provider before you start taking them again. °Wound care °· If your surgeon has prescribed an elastic bandage or stocking, ask how long you should wear it. °· Check the area around your surgical  cuts (incisions) whenever your bandages (dressings) are changed. Look for any redness or swelling. °· You will need to return to have the stitches (sutures) or staples taken out. Ask your surgeon when to do that. °· Ask your surgeon when you can shower or bathe. °Activity °· Try to keep your legs raised when you are sitting. °· Do any exercises your health care providers have given you. These may include deep breathing exercises, coughing, walking, or other exercises. °SEEK MEDICAL CARE IF: °· You have any questions about your medicines. °· You have more leg pain, especially if your pain medicine stops working. °· New or growing bruises develop on your leg. °· Your leg swells, feels tight, or becomes red. °· You have numbness in your leg. °SEEK IMMEDIATE MEDICAL CARE IF: °· Your pain gets much worse. °· Blood or fluid leaks from any of the incisions. °· Your incisions become warm, swollen, or red. °· You have chest pain. °· You have trouble breathing. °· You have a fever. °· You have more pain near your leg incision. °MAKE SURE YOU: °· Understand these instructions. °· Will watch your condition. °· Will get help right away if you are not doing well or   get worse. °  °This information is not intended to replace advice given to you by your health care provider. Make sure you discuss any questions you have with your health care provider. °  °Document Released: 10/01/2010 Document Revised: 02/09/2014 Document Reviewed: 10/01/2010 °Elsevier Interactive Patient Education ©2016 Else[employer] Inc. ° ° °

## 2015-11-01 NOTE — Progress Notes (Signed)
Physical Therapy Treatment Patient Details Name: Steven Riddle MRN: [id number] DOB: 08/21/1955 Today's Date: 11/01/2015    History of Present Illness The patient is a 60 year old [place] gentleman with a history of poorly controlled DM with peripheral neuropathy and ESRD, hypertension, s/p renal transplant as well as severe multi-vessel coronary artery disease s/p PCI/stenting, was admitted on 10/18/15 with SOB, cough and LE edema.  Found to have non Q wave MI, now s/p CABG x 3 10/28/15    PT Comments    Pt with excellent progression with transfers, gait and awareness of precautions. HEP and handout for sternal precaution provided with education for spouse and pt. Pt encouraged to continue to increase mobility and continues to prefer rollator for seated rest with gait.   HR 81-87 with gait, no SOB  Follow Up Recommendations  Home health PT;Supervision/Assistance - 24 hour     Equipment Recommendations       Recommendations for Other Services       Precautions / Restrictions Precautions Precautions: Fall;Sternal Precaution Comments: handout provided, pt unaware of precautions on arrival    Mobility  Bed Mobility Overal bed mobility: Needs Assistance Bed Mobility: Rolling;Sidelying to Sit Rolling: Min guard Sidelying to sit: Min assist     Sit to sidelying: Min guard General bed mobility comments: cues for sequence, safety and family assist  Transfers Overall transfer level: Needs assistance   Transfers: Sit to/from Stand Sit to Stand: Min assist         General transfer comment: cues for sequence, hand placement, anterior translation and assist to rise from surface  Ambulation/Gait Ambulation/Gait assistance: Min guard Ambulation Distance (Feet): 200 Feet Assistive device: 4-wheeled walker Gait Pattern/deviations: Step-through pattern;Decreased stride length;Trunk flexed   Gait velocity interpretation: Below normal speed for age/gender General Gait Details:  cues for proximity to rollator and posture, pt with 2 slight LOB able to recover with tactile cues. 200' x 2 trials with seated rest between   Stairs            Wheelchair Mobility    Modified Rankin (Stroke Patients Only)       Balance                                    Cognition Arousal/Alertness: Awake/alert Behavior During Therapy: WFL for tasks assessed/performed Overall Cognitive Status: Within Functional Limits for tasks assessed       Memory: Decreased recall of precautions              Exercises General Exercises - Lower Extremity Long Arc Quad: AROM;15 reps;Both;Seated Hip ABduction/ADduction: AROM;15 reps;Both;Seated Hip Flexion/Marching: AROM;15 reps;Both;Seated    General Comments        Pertinent Vitals/Pain Pain Assessment: No/denies pain    Home Living                      Prior Function            PT Goals (current goals can now be found in the care plan section) Progress towards PT goals: Progressing toward goals    Frequency           PT Plan Current plan remains appropriate    Co-evaluation             End of Session Equipment Utilized During Treatment: Gait belt Activity Tolerance: Patient tolerated treatment well Patient left: in chair;with call bell/phone within reach;with  family/visitor present     Time: 6578-4696 PT Time Calculation (min) (ACUTE ONLY): 28 min  Charges:  $Gait Training: 8-22 mins $Therapeutic Exercise: 8-22 mins                    G Codes:      Delorse Lek November 14, 2015, 9:36 AM Delaney Meigs, PT [phone]

## 2015-11-02 LAB — BASIC METABOLIC PANEL
ANION GAP: 8 (ref 5–15)
BUN: 36 mg/dL — ABNORMAL HIGH (ref 6–20)
CO2: 24 mmol/L (ref 22–32)
Calcium: 8.5 mg/dL — ABNORMAL LOW (ref 8.9–10.3)
Chloride: 103 mmol/L (ref 101–111)
Creatinine, Ser: 1.33 mg/dL — ABNORMAL HIGH (ref 0.61–1.24)
GFR, EST NON AFRICAN AMERICAN: 56 mL/min — AB (ref >60)
Glucose, Bld: 186 mg/dL — ABNORMAL HIGH (ref 65–99)
POTASSIUM: 4 mmol/L (ref 3.5–5.1)
SODIUM: 135 mmol/L (ref 135–145)

## 2015-11-02 LAB — GLUCOSE, CAPILLARY
GLUCOSE-CAPILLARY: 52 mg/dL — AB (ref 65–99)
GLUCOSE-CAPILLARY: 67 mg/dL (ref 65–99)
Glucose-Capillary: 160 mg/dL — ABNORMAL HIGH (ref 65–99)

## 2015-11-02 MED ORDER — CYCLOSPORINE 25 MG PO CAPS: 75.0000 mg | ORAL_CAPSULE | Freq: Two times a day (BID) | ORAL | 1 refills | Status: AC

## 2015-11-02 MED ORDER — PREDNISONE 10 MG PO TABS: 10.0000 mg | ORAL_TABLET | Freq: Every day | ORAL | Status: DC

## 2015-11-02 MED ORDER — PREDNISONE 10 MG PO TABS: 10.0000 mg | ORAL_TABLET | Freq: Every day | ORAL | 1 refills | Status: AC

## 2015-11-02 MED ORDER — INSULIN DETEMIR 100 UNIT/ML ~~LOC~~ SOLN
10.0000 [IU] | Freq: Every day | SUBCUTANEOUS | Status: DC
  Administered 2015-11-02: 10 [IU] via SUBCUTANEOUS
  Filled 2015-11-02: qty 0.1

## 2015-11-02 MED ORDER — OXYCODONE HCL 5 MG PO TABS: 5.0000 mg | ORAL_TABLET | Freq: Four times a day (QID) | ORAL | 0 refills | Status: DC | PRN

## 2015-11-02 MED ORDER — FUROSEMIDE 40 MG PO TABS: 40.0000 mg | ORAL_TABLET | Freq: Every day | ORAL | 1 refills | Status: AC

## 2015-11-02 MED ORDER — INSULIN DETEMIR 100 UNIT/ML ~~LOC~~ SOLN: 10.0000 [IU] | Freq: Two times a day (BID) | SUBCUTANEOUS | 1 refills | Status: AC

## 2015-11-02 MED ORDER — ASPIRIN 325 MG PO TBEC: 325.0000 mg | DELAYED_RELEASE_TABLET | Freq: Every day | ORAL | Status: AC

## 2015-11-02 NOTE — Progress Notes (Signed)
CT sutures removed as ordered and steri strips applied. Patient given discharge instructions, follow up appointments, medication list, and paper prescriptions, along with exit care documents, all questions were answered will discharge home as ordered. Nayelli Inglis, Randall AnKristin Jessup RN

## 2015-11-02 NOTE — Progress Notes (Signed)
[address]       [place], [address]             [phone]      5 Days Post-Op Procedure(s) (LRB): CORONARY ARTERY BYPASS GRAFTING (CABG)  X  3   UTILIZING THE LEFT INTERNAL MAMMARY ARTERY AND ENDOSCOPICALLY HARVESTED RIGHT SAPHENEOUS VEINS. (N/A) TRANSESOPHAGEAL ECHOCARDIOGRAM (TEE) (N/A) Subjective: Feels fairly well   Objective: Vital signs in last 24 hours: Temp:  [97.7 F (36.5 C)-98.2 F (36.8 C)] 97.7 F (36.5 C) (09/30 0628) Pulse Rate:  [76-80] 77 (09/30 0937) Cardiac Rhythm: Normal sinus rhythm;Bundle branch block (09/30 0759) Resp:  [20] 20 (09/30 0628) BP: (118-166)/(53-80) 145/65 (09/30 0937) SpO2:  [96 %-100 %] 100 % (09/30 0628) Weight:  [145 lb 8 oz (66 kg)] 145 lb 8 oz (66 kg) (09/30 0628)  Hemodynamic parameters for last 24 hours:    Intake/Output from previous day: 09/29 0701 - 09/30 0700 In: 1030 [P.O.:1030] Out: 1556 [Urine:1555; Stool:1] Intake/Output this shift: No intake/output data recorded.  General appearance: alert, cooperative and no distress Heart: regular rate and rhythm Lungs: clear to auscultation bilaterally Abdomen: benign Extremities: + pedal/ankle edema Wound: incis healing well  Lab Results:  Recent Labs  10/31/15 0414 11/01/15 0215  WBC 9.6 8.2  HGB 9.7* 10.4*  HCT 31.5* 33.3*  PLT 139* 161   BMET:   Recent Labs  10/31/15 0415 11/01/15 0215  NA 139 140  K 4.8 4.4  CL 114* 114*  CO2 20* 22  GLUCOSE 131* 115*  BUN 38* 43*  CREATININE 2.08* 1.94*  CALCIUM 8.3* 8.6*    PT/INR: No results for input(s): LABPROT, INR in the last 72 hours. ABG    Component Value Date/Time   PHART 7.346 (L) 10/29/2015 0436   HCO3 18.6 (L) 10/29/2015 0436   TCO2 20 10/29/2015 1630   ACIDBASEDEF 7.0 (H) 10/29/2015 0436   O2SAT 93.0 10/29/2015 0436   CBG (last 3)   Recent Labs  11/01/15 2114 11/02/15 0618 11/02/15 0646  GLUCAP 133* 52* 67    Meds Scheduled Meds: . aspirin EC  325 mg Oral  Daily  . brimonidine  1 drop Left Eye QHS  . carvedilol  6.25 mg Oral BID WC  . chlorhexidine gluconate (MEDLINE KIT)  15 mL Mouth Rinse BID  . cycloSPORINE  75 mg Oral BID  . docusate sodium  200 mg Oral Daily  . dorzolamide  1 drop Left Eye QHS  . enoxaparin (LOVENOX) injection  40 mg Subcutaneous QHS  . famotidine  20 mg Oral Daily  . furosemide  40 mg Oral Daily  . insulin aspart  0-24 Units Subcutaneous TID AC & HS  . insulin detemir  10 Units Subcutaneous Daily  . latanoprost  1 drop Left Eye QHS  . mouth rinse  15 mL Mouth Rinse QID  . mycophenolate  180 mg Oral QPM  . [START ON 11/03/2015] predniSONE  10 mg Oral Daily  . rosuvastatin  10 mg Oral Daily  . sodium chloride flush  3 mL Intravenous Q12H   Continuous Infusions:  PRN Meds:.sodium chloride, acetaminophen, bisacodyl **OR** bisacodyl, ondansetron **OR** ondansetron (ZOFRAN) IV, oxyCODONE, sodium chloride flush, traMADol  Xrays No results found.  Assessment/Plan: S/P Procedure(s) (LRB): CORONARY ARTERY BYPASS GRAFTING (CABG)  X  3   UTILIZING THE LEFT INTERNAL MAMMARY ARTERY AND ENDOSCOPICALLY HARVESTED RIGHT SAPHENEOUS VEINS. (N/A) TRANSESOPHAGEAL ECHOCARDIOGRAM (TEE) (N/A)  1 conts with steady progress, ok for d/c per  Dr Deterding- prednisone and insulin have been adjusted, cont current lasix dose     LOS: 14 days    Kayon Dozier E 11/02/2015

## 2015-11-02 NOTE — Progress Notes (Signed)
Hypoglycemic Event  CBG:52  Treatment: 1 cup orange juice,crackers  Symptoms: none  Follow-up CBG: Time:0646 CBG Result:67  Possible Reasons for Event: unknown  Comments/MD notified:no    Jamey ReasAguirre, Shellie Rogoff McDonald[employer] CorporationCaynap

## 2015-11-02 NOTE — Progress Notes (Signed)
Subjective: Interval History: has no complaint .  Objective: Vital signs in last 24 hours: Temp:  [97.7 F (36.5 C)-98.2 F (36.8 C)] 97.7 F (36.5 C) (09/30 0628) Pulse Rate:  [76-80] 77 (09/30 0937) Resp:  [20] 20 (09/30 0628) BP: (118-166)/(53-80) 145/65 (09/30 0937) SpO2:  [96 %-100 %] 100 % (09/30 0628) Weight:  [66 kg (145 lb 8 oz)] 66 kg (145 lb 8 oz) (09/30 [id number]) Weight change:   Intake/Output from previous day: 09/29 0701 - 09/30 0700 In: 1030 [P.O.:1030] Out: 1556 [Urine:1555; Stool:1] Intake/Output this shift: No intake/output data recorded.  General appearance: alert, cooperative and no distress Resp: diminished breath sounds bilaterally and rales bibasilar Cardio: S1, S2 normal and systolic murmur: holosystolic 2/6, blowing at apex GI: soft, liver down 4 cm, TX RLQ normal Extremities: edema 1-2+  Lab Results:  Recent Labs  10/31/15 0414 11/01/15 0215  WBC 9.6 8.2  HGB 9.7* 10.4*  HCT 31.5* 33.3*  PLT 139* 161   BMET:  Recent Labs  10/31/15 0415 11/01/15 0215  NA 139 140  K 4.8 4.4  CL 114* 114*  CO2 20* 22  GLUCOSE 131* 115*  BUN 38* 43*  CREATININE 2.08* 1.94*  CALCIUM 8.3* 8.6*   No results for input(s): PTH in the last 72 hours. Iron Studies: No results for input(s): IRON, TIBC, TRANSFERRIN, FERRITIN in the last 72 hours.  Studies/Results: No results found.  I have reviewed the patient's current medications.  Assessment/Plan: 1 Renal TX Labs pending. bp ok. Lower steroid dose.  Diuresing on Lasix 2 DM controlled 3 CAD/CABG per [facility] 4 anemia follow P Lasix, lower pred, check labs.    LOS: 14 days   Loxley Schmale L 11/02/2015,9:45 AM

## 2015-11-02 NOTE — Progress Notes (Signed)
Ref: Romero Belling, MD   Subjective:  Hypoglycemic event noted. Afebrile. 2 + leg edema. Denies difficulty breathing.  Objective:  Vital Signs in the last 24 hours: Temp:  [97.7 F (36.5 C)-98.2 F (36.8 C)] 97.7 F (36.5 C) (09/30 0628) Pulse Rate:  [76-80] 77 (09/30 0937) Cardiac Rhythm: Normal sinus rhythm;Bundle branch block (09/30 0759) Resp:  [20] 20 (09/30 0628) BP: (118-166)/(53-80) 145/65 (09/30 0937) SpO2:  [96 %-100 %] 100 % (09/30 0628) Weight:  [66 kg (145 lb 8 oz)] 66 kg (145 lb 8 oz) (09/30 1813)  Physical Exam: BP Readings from Last 1 Encounters:  11/02/15 (!) 145/65    Wt Readings from Last 1 Encounters:  11/02/15 66 kg (145 lb 8 oz)    Weight change:   HEENT: [place]/AT, Eyes-Brown, PERL, EOMI, Conjunctiva-Pink, Sclera-Non-icteric Neck: No JVD, No bruit, Trachea midline. Lungs:  Clear, Bilateral. Healing midline scar. Cardiac:  Regular rhythm, normal S1 and S2, no S3.  Abdomen:  Soft, non-tender. Extremities:  2 + edema present. No cyanosis. No clubbing. CNS: AxOx3, Cranial nerves grossly intact, moves all 4 extremities.  Skin: Warm and dry.   Intake/Output from previous day: 09/29 0701 - 09/30 0700 In: 1030 [P.O.:1030] Out: 1556 [Urine:1555; Stool:1]    Lab Results: BMET    Component Value Date/Time   NA 140 11/01/2015 0215   NA 139 10/31/2015 0415   NA 140 10/30/2015 0420   K 4.4 11/01/2015 0215   K 4.8 10/31/2015 0415   K 4.8 10/30/2015 0420   CL 114 (H) 11/01/2015 0215   CL 114 (H) 10/31/2015 0415   CL 111 10/30/2015 0420   CO2 22 11/01/2015 0215   CO2 20 (L) 10/31/2015 0415   CO2 20 (L) 10/30/2015 0420   GLUCOSE 115 (H) 11/01/2015 0215   GLUCOSE 131 (H) 10/31/2015 0415   GLUCOSE 112 (H) 10/30/2015 0420   BUN 43 (H) 11/01/2015 0215   BUN 38 (H) 10/31/2015 0415   BUN 25 (H) 10/30/2015 0420   CREATININE 1.94 (H) 11/01/2015 0215   CREATININE 2.08 (H) 10/31/2015 0415   CREATININE 1.92 (H) 10/30/2015 0420   CALCIUM 8.6 (L) 11/01/2015 0215    CALCIUM 8.3 (L) 10/31/2015 0415   CALCIUM 8.4 (L) 10/30/2015 0420   CALCIUM 9.5 05/12/2011 1307   CALCIUM 8.8 04/14/2011 1328   CALCIUM 8.6 03/17/2011 1337   GFRNONAA 36 (L) 11/01/2015 0215   GFRNONAA 33 (L) 10/31/2015 0415   GFRNONAA 36 (L) 10/30/2015 0420   GFRAA 41 (L) 11/01/2015 0215   GFRAA 38 (L) 10/31/2015 0415   GFRAA 42 (L) 10/30/2015 0420   CBC    Component Value Date/Time   WBC 8.2 11/01/2015 0215   RBC 3.84 (L) 11/01/2015 0215   HGB 10.4 (L) 11/01/2015 0215   HCT 33.3 (L) 11/01/2015 0215   PLT 161 11/01/2015 0215   MCV 86.7 11/01/2015 0215   MCH 27.1 11/01/2015 0215   MCHC 31.2 11/01/2015 0215   RDW 17.5 (H) 11/01/2015 0215   LYMPHSABS 2.3 10/19/2015 0857   MONOABS 0.8 10/19/2015 0857   EOSABS 0.4 10/19/2015 0857   BASOSABS 0.1 10/19/2015 0857   HEPATIC Function Panel  Recent Labs  10/19/15 0857  PROT 7.1   HEMOGLOBIN A1C No components found for: HGA1C,  MPG CARDIAC ENZYMES Lab Results  Component Value Date   TROPONINI 0.23 (HH) 10/20/2015   TROPONINI 0.23 (HH) 10/19/2015   TROPONINI 0.23 (HH) 10/19/2015   BNP No results for input(s): PROBNP in the last 8760 hours.  TSH  Recent Labs  04/01/15 1533 10/19/15 0857  TSH 2.07 1.597   CHOLESTEROL  Recent Labs  10/20/15 0608  CHOL 136    Scheduled Meds: . aspirin EC  325 mg Oral Daily  . brimonidine  1 drop Left Eye QHS  . carvedilol  6.25 mg Oral BID WC  . chlorhexidine gluconate (MEDLINE KIT)  15 mL Mouth Rinse BID  . cycloSPORINE  75 mg Oral BID  . docusate sodium  200 mg Oral Daily  . dorzolamide  1 drop Left Eye QHS  . enoxaparin (LOVENOX) injection  40 mg Subcutaneous QHS  . famotidine  20 mg Oral Daily  . furosemide  40 mg Oral Daily  . insulin aspart  0-24 Units Subcutaneous TID AC & HS  . insulin detemir  10 Units Subcutaneous Daily  . latanoprost  1 drop Left Eye QHS  . mouth rinse  15 mL Mouth Rinse QID  . mycophenolate  180 mg Oral QPM  . [START ON 11/03/2015] predniSONE   10 mg Oral Daily  . rosuvastatin  10 mg Oral Daily  . sodium chloride flush  3 mL Intravenous Q12H   Continuous Infusions:  PRN Meds:.sodium chloride, acetaminophen, bisacodyl **OR** bisacodyl, ondansetron **OR** ondansetron (ZOFRAN) IV, oxyCODONE, sodium chloride flush, traMADol  Assessment/Plan: Small non-Q wave MI Multivessel CAD 3 vessel CABG Hypertension DM, II Hyperlipidemia CKD, II S/P renal transplant post ESRD.  Decrease insulin dose by 50 %. Apply ted hose. Increase activity.   LOS: 14 days    Dixie Dials  MD  11/02/2015, 9:50 AM

## 2015-11-04 LAB — CYCLOSPORINE: Cyclosporine, LabCorp: 135 ng/mL (ref 100–400)

## 2015-11-22 LAB — SPIROMETRY WITH GRAPH
FEF 25-75 Post: 1.52 L/sec
FEF 25-75 Pre: 1.58 L/sec
FEF2575-%Change-Post: -3 %
FEF2575-%Pred-Post: 59 %
FEF2575-%Pred-Pre: 62 %
FEV1-%CHANGE-POST: 0 %
FEV1-%PRED-PRE: 48 %
FEV1-%Pred-Post: 48 %
FEV1-POST: 1.45 L
FEV1-PRE: 1.45 L
FEV1FVC-%CHANGE-POST: -2 %
FEV1FVC-%Pred-Pre: 102 %
FEV6-%CHANGE-POST: 4 %
FEV6-Post: 1.81 L
FEV6-Pre: 1.74 L
FEV6FVC-%CHANGE-POST: 0 %
FVC-%CHANGE-POST: 2 %
FVC-%PRED-POST: 48 %
FVC-%PRED-PRE: 47 %
FVC-PRE: 1.79 L
FVC-Post: 1.83 L
POST FEV6/FVC RATIO: 99 %
PRE FEV6/FVC RATIO: 100 %
Post FEV1/FVC ratio: 80 %
Pre FEV1/FVC ratio: 81 %

## 2015-12-03 ENCOUNTER — Other Ambulatory Visit: Payer: Self-pay | Admitting: Surgery

## 2015-12-03 DIAGNOSIS — Z951 Presence of aortocoronary bypass graft: Secondary | ICD-10-CM

## 2015-12-04 ENCOUNTER — Encounter: Payer: Self-pay | Admitting: Surgery

## 2015-12-04 ENCOUNTER — Ambulatory Visit
Admission: RE | Admit: 2015-12-04 | Discharge: 2015-12-04 | Disposition: A | Discharge status: Home / Self Care | Payer: PPO | Source: Ambulatory Visit | Attending: Surgery | Admitting: Surgery

## 2015-12-04 ENCOUNTER — Ambulatory Visit (INDEPENDENT_AMBULATORY_CARE_PROVIDER_SITE_OTHER): Payer: Self-pay | Admitting: Surgery

## 2015-12-04 VITALS — BP 99/55 | HR 70 | Resp 20 | Ht 67.0 in | Wt 141.0 lb

## 2015-12-04 DIAGNOSIS — Z951 Presence of aortocoronary bypass graft: Secondary | ICD-10-CM

## 2015-12-04 DIAGNOSIS — L03115 Cellulitis of right lower limb: Secondary | ICD-10-CM

## 2015-12-04 MED ORDER — OXYCODONE HCL 5 MG PO TABS: 5.0000 mg | ORAL_TABLET | Freq: Four times a day (QID) | ORAL | 0 refills | Status: AC | PRN

## 2015-12-04 MED ORDER — CEPHALEXIN 500 MG PO CAPS
500.0000 mg | ORAL_CAPSULE | Freq: Three times a day (TID) | ORAL | 0 refills | Status: DC
Start: 2015-12-04 — End: 2015-12-11

## 2015-12-04 NOTE — Progress Notes (Signed)
HPI:  Patient returns for routine postoperative follow-up having undergone CABG x 3 on 10/28/2015. The patient's early postoperative recovery while in the hospital was notable for an uncomplicated postop course. Since hospital discharge the patient reports that he has been feeling well. He is walking daily without chest pain or shortness of breath. He did notice some redness and swelling around his right leg vein harvest incision since yesterday. He denies any fever or chills.   Current Outpatient Prescriptions  Medication Sig Dispense Refill  . aspirin EC 325 MG EC tablet Take 1 tablet (325 mg total) by mouth daily.    . BD ULTRA-FINE PEN NEEDLES 29G X 12.7MM MISC USE AS DIRECTED 100 each 5  . brimonidine (ALPHAGAN) 0.2 % ophthalmic solution Place 1 drop into the left eye at bedtime.    . carvedilol (COREG) 6.25 MG tablet Take 6.25 mg by mouth daily.    . cycloSPORINE (SANDIMMUNE) 25 MG capsule Take 3 capsules (75 mg total) by mouth 2 (two) times daily. 180 capsule 1  . dorzolamide-timolol (COSOPT) 22.3-6.8 MG/ML ophthalmic solution Apply 1 drop to eye at bedtime.    . furosemide (LASIX) 40 MG tablet Take 1 tablet (40 mg total) by mouth daily. 30 tablet 1  . insulin detemir (LEVEMIR) 100 UNIT/ML injection Inject 0.1 mLs (10 Units total) into the skin 2 (two) times daily. And pen needles 1/day 10 mL 1  . latanoprost (XALATAN) 0.005 % ophthalmic solution Place 1 drop into the left eye at bedtime.    . Multiple Vitamin (MULTIVITAMIN WITH MINERALS) TABS tablet Take 1 tablet by mouth daily. CENTRUM MULTIVITAMIN FOR 50+    . mycophenolate (MYFORTIC) 180 MG EC tablet Take 180 mg by mouth every evening.     Marland Kitchen. oxyCODONE (OXY IR/ROXICODONE) 5 MG immediate release tablet Take 1-2 tablets (5-10 mg total) by mouth every 6 (six) hours as needed for severe pain. 30 tablet 0  . predniSONE (DELTASONE) 10 MG tablet Take 1 tablet (10 mg total) by mouth daily. 30 tablet 1  . rosuvastatin (CRESTOR) 10 MG  tablet Take 10 mg by mouth daily.    . cephALEXin (KEFLEX) 500 MG capsule Take 1 capsule (500 mg total) by mouth 3 (three) times daily. 21 capsule 0   No current facility-administered medications for this visit.    Facility-Administered Medications Ordered in Other Visits  Medication Dose Route Frequency Provider Last Rate Last Dose  . magnesium oxide (MAG-OX) tablet 400 mg  400 mg Oral Once Drema Dallasurtis J Woods, MD        Physical Exam: BP (!) 99/55 (BP Location: Left Arm, Patient Position: Sitting, Cuff Size: Small)   Pulse 70   Resp 20   Ht 5\' 7"  (1.702 m)   Wt 141 lb (64 kg)   SpO2 97% Comment: RA  BMI 22.08 kg/m  He looks well. Lung exam is clear. Cardiac exam shows a regular rate and rhythm with normal heart sounds. Chest incision is healing well and sternum is stable. The right leg incision has slight separation at the end of the incision and is draining a small amount of serous fluid. There is no erythema, swelling or tenderness. There is mild bilateral ankle and pedal edema.   Diagnostic Tests:  CLINICAL DATA:  Status post CABG on 10/28/2015 with some shortness of breath with exertion. Right chest pains when taking a deep breath.  EXAM: CHEST  2 VIEW  COMPARISON:  Chest radiograph 10/30/2015 and 04/01/2015 and 06/03/2014  FINDINGS: There  are changes of median sternotomy for CABG. Mild cardiomegaly. Normal pulmonary vascularity. Interval clearing of the previously described airspace opacity in the right lung base since the chest radiograph of 10/30/2015. Negative for pulmonary edema, focal airspace opacity, effusion, or pneumothorax. The  Stable mild superior endplate compression fracture of a vertebral body near the lumbar thoracic junction, likely L1. Stable rounded density in the right humeral head, likely a benign bone island.  IMPRESSION: No active cardiopulmonary disease. Specifically, the previously described airspace disease at the right lung base on  the chest radiograph of 10/30/2015 has resolved.  Status post CABG with mild cardiomegaly.  Mild superior endplate compression fracture of a vertebral body, likely L1. This is stable.   Electronically Signed   By: Britta MccreedySusan  Turner M.D.   On: 12/04/2015 14:41   Impression:  Overall I think he is doing well. I encouraged him to continue walking. He is planning to participate in cardiac rehab. He has some serous drainage from the leg incision and slight separation of the skin edge at one end. I think he had a small seroma that drained. There is no sign of infection but since he is immunosuppressed for his kidney transplant I will put him on Keflex 500 mg tid for seven days. I asked him to keep a dry dressing over the incision as long as it is draining. I told him he could drive his car but should not lift anything heavier than 10 lbs for three months postop.   Plan:  I will see him back in one week to check the leg incision. He will call if there is any change or if he develops fever or chills.   Alleen BorneBryan K Bartle, MD [facility] [phone]

## 2015-12-11 ENCOUNTER — Encounter: Payer: Self-pay | Admitting: Surgery

## 2015-12-11 ENCOUNTER — Ambulatory Visit (INDEPENDENT_AMBULATORY_CARE_PROVIDER_SITE_OTHER): Payer: Self-pay | Admitting: Surgery

## 2015-12-11 VITALS — BP 109/62 | HR 77 | Resp 16 | Ht 67.0 in | Wt 141.0 lb

## 2015-12-11 DIAGNOSIS — L03115 Cellulitis of right lower limb: Secondary | ICD-10-CM

## 2015-12-11 DIAGNOSIS — Z951 Presence of aortocoronary bypass graft: Secondary | ICD-10-CM

## 2015-12-11 MED ORDER — CEPHALEXIN 500 MG PO CAPS: 500.0000 mg | ORAL_CAPSULE | Freq: Three times a day (TID) | ORAL | 0 refills | Status: AC

## 2015-12-11 NOTE — Progress Notes (Signed)
     HPI:  [name] returns today for follow up of his right leg incision which has been draining serous fluid. He has completed a one week course of Keflex. He denies fever or chills.  Current Outpatient Prescriptions  Medication Sig Dispense Refill  . aspirin EC 325 MG EC tablet Take 1 tablet (325 mg total) by mouth daily.    . BD ULTRA-FINE PEN NEEDLES 29G X 12.7MM MISC USE AS DIRECTED 100 each 5  . brimonidine (ALPHAGAN) 0.2 % ophthalmic solution Place 1 drop into the left eye at bedtime.    . carvedilol (COREG) 6.25 MG tablet Take 6.25 mg by mouth daily.    . cephALEXin (KEFLEX) 500 MG capsule Take 1 capsule (500 mg total) by mouth 3 (three) times daily. 21 capsule 0  . cycloSPORINE (SANDIMMUNE) 25 MG capsule Take 3 capsules (75 mg total) by mouth 2 (two) times daily. 180 capsule 1  . dorzolamide-timolol (COSOPT) 22.3-6.8 MG/ML ophthalmic solution Apply 1 drop to eye at bedtime.    . furosemide (LASIX) 40 MG tablet Take 1 tablet (40 mg total) by mouth daily. 30 tablet 1  . insulin detemir (LEVEMIR) 100 UNIT/ML injection Inject 0.1 mLs (10 Units total) into the skin 2 (two) times daily. And pen needles 1/day 10 mL 1  . latanoprost (XALATAN) 0.005 % ophthalmic solution Place 1 drop into the left eye at bedtime.    . Multiple Vitamin (MULTIVITAMIN WITH MINERALS) TABS tablet Take 1 tablet by mouth daily. CENTRUM MULTIVITAMIN FOR 50+    . mycophenolate (MYFORTIC) 180 MG EC tablet Take 180 mg by mouth every evening.     Marland Kitchen. oxyCODONE (OXY IR/ROXICODONE) 5 MG immediate release tablet Take 1-2 tablets (5-10 mg total) by mouth every 6 (six) hours as needed for severe pain. 30 tablet 0  . predniSONE (DELTASONE) 10 MG tablet Take 1 tablet (10 mg total) by mouth daily. 30 tablet 1  . rosuvastatin (CRESTOR) 10 MG tablet Take 10 mg by mouth daily.     No current facility-administered medications for this visit.    Facility-Administered Medications Ordered in Other Visits  Medication Dose Route  Frequency Provider Last Rate Last Dose  . magnesium oxide (MAG-OX) tablet 400 mg  400 mg Oral Once Drema Dallasurtis J Woods, MD         Physical Exam: Blood pressure 109/62, pulse 77, resp. rate 16, height 5\' 7"  (1.702 m), weight 141 lb (64 kg), SpO2 98 %. BP 109/62   Pulse 77   Resp 16   Ht 5\' 7"  (1.702 m)   Wt 141 lb (64 kg)   SpO2 98% Comment: ON RA  BMI 22.08 kg/m  He looks well The right leg incision is still draining serous fluid with slight separation of the skin edges in the posterior portion of the incision. There is no erythema. He has mild edema in the lower leg.   Impression:  He has persistent mild serous drainage from the leg wound. There is no sign of infection but given his immunosuppression for his kidney transplant I will continue Keflex for another week. He will keep a dry dressing over the incision.  Plan:  Keflex 500 mg tid x 7 day.  Follow up in one week   Alleen BorneBryan K Bartle, MD [facility] [phone]

## 2015-12-12 ENCOUNTER — Encounter: Payer: Self-pay | Admitting: Surgery

## 2015-12-12 NOTE — Progress Notes (Signed)
This gentleman was admitted with progressive shortness of breath with minimal activity, lower extremity edema, and an EF of 40-45% consistent with acute systolic congestive heart failure,  NYHA class III.

## 2015-12-18 ENCOUNTER — Ambulatory Visit (INDEPENDENT_AMBULATORY_CARE_PROVIDER_SITE_OTHER): Payer: Self-pay | Admitting: Surgery

## 2015-12-18 ENCOUNTER — Encounter: Payer: Self-pay | Admitting: Surgery

## 2015-12-18 VITALS — BP 113/64 | HR 83 | Resp 18 | Ht 67.0 in | Wt 140.0 lb

## 2015-12-18 DIAGNOSIS — Z951 Presence of aortocoronary bypass graft: Secondary | ICD-10-CM

## 2015-12-18 DIAGNOSIS — L03115 Cellulitis of right lower limb: Secondary | ICD-10-CM

## 2015-12-19 ENCOUNTER — Encounter: Payer: Self-pay | Admitting: Surgery

## 2015-12-19 NOTE — Progress Notes (Signed)
      HPI:  [name] returns today for examination of his right leg vein harvest incision that has been draining serous fluid. It continues to drain a small amount and he is changing the dressing twice daily. He has completed a second week of Keflex and denies having any fever or chills. He has some pain around the knee incision with ambulation.  Current Outpatient Prescriptions  Medication Sig Dispense Refill  . aspirin EC 325 MG EC tablet Take 1 tablet (325 mg total) by mouth daily.    . BD ULTRA-FINE PEN NEEDLES 29G X 12.7MM MISC USE AS DIRECTED 100 each 5  . brimonidine (ALPHAGAN) 0.2 % ophthalmic solution Place 1 drop into the left eye at bedtime.    . carvedilol (COREG) 6.25 MG tablet Take 6.25 mg by mouth daily.    . cycloSPORINE (SANDIMMUNE) 25 MG capsule Take 3 capsules (75 mg total) by mouth 2 (two) times daily. 180 capsule 1  . dorzolamide-timolol (COSOPT) 22.3-6.8 MG/ML ophthalmic solution Apply 1 drop to eye at bedtime.    . furosemide (LASIX) 40 MG tablet Take 1 tablet (40 mg total) by mouth daily. 30 tablet 1  . insulin detemir (LEVEMIR) 100 UNIT/ML injection Inject 0.1 mLs (10 Units total) into the skin 2 (two) times daily. And pen needles 1/day 10 mL 1  . latanoprost (XALATAN) 0.005 % ophthalmic solution Place 1 drop into the left eye at bedtime.    . Multiple Vitamin (MULTIVITAMIN WITH MINERALS) TABS tablet Take 1 tablet by mouth daily. CENTRUM MULTIVITAMIN FOR 50+    . mycophenolate (MYFORTIC) 180 MG EC tablet Take 180 mg by mouth every evening.     Marland Kitchen. oxyCODONE (OXY IR/ROXICODONE) 5 MG immediate release tablet Take 1-2 tablets (5-10 mg total) by mouth every 6 (six) hours as needed for severe pain. 30 tablet 0  . predniSONE (DELTASONE) 10 MG tablet Take 1 tablet (10 mg total) by mouth daily. 30 tablet 1  . rosuvastatin (CRESTOR) 10 MG tablet Take 10 mg by mouth daily.     No current facility-administered medications for this visit.    Facility-Administered Medications  Ordered in Other Visits  Medication Dose Route Frequency Provider Last Rate Last Dose  . magnesium oxide (MAG-OX) tablet 400 mg  400 mg Oral Once Drema Dallasurtis J Woods, MD         Physical Exam: BP 113/64 (BP Location: Left Arm, Patient Position: Sitting, Cuff Size: Small)   Pulse 83   Resp 18   Ht 5\' 7"  (1.702 m)   Wt 140 lb (63.5 kg)   SpO2 96%   BMI 21.93 kg/m  The leg incision is draining clear serous fluid in one small spot. The remainder of the incision is healed. There is no erythema or tenderness. There is no significant edema in the leg   Impression:  He has a small amount of serous drainage from the leg incision but the incision appears to be healing well otherwise. Hopefully this will stop and the incision will close over completely. There is no sign of infection and I don't think the incision needs exploration.   Plan:  He will keep the incision clean and covered with a dry sterile dressing. I will see him back in 3 weeks for follow up.   Alleen BorneBryan K Bartle, MD [facility] [phone]

## 2016-01-03 DEATH — deceased

## 2016-01-08 ENCOUNTER — Encounter: Payer: PPO | Admitting: Surgery
